# Patient Record
Sex: Female | Born: 1940 | Race: White | Hispanic: No | Marital: Married | Smoking: Never smoker
Health system: Southern US, Community
[De-identification: ages and names within clinical notes are randomized; demographics above are authoritative.]

## PROBLEM LIST (undated history)

## (undated) DIAGNOSIS — I10 Essential (primary) hypertension: Secondary | ICD-10-CM

## (undated) DIAGNOSIS — M069 Rheumatoid arthritis, unspecified: Secondary | ICD-10-CM

## (undated) DIAGNOSIS — M1990 Unspecified osteoarthritis, unspecified site: Secondary | ICD-10-CM

## (undated) DIAGNOSIS — E7800 Pure hypercholesterolemia, unspecified: Secondary | ICD-10-CM

## (undated) DIAGNOSIS — D649 Anemia, unspecified: Secondary | ICD-10-CM

## (undated) DIAGNOSIS — E119 Type 2 diabetes mellitus without complications: Secondary | ICD-10-CM

## (undated) DIAGNOSIS — I719 Aortic aneurysm of unspecified site, without rupture: Secondary | ICD-10-CM

## (undated) DIAGNOSIS — K2950 Unspecified chronic gastritis without bleeding: Secondary | ICD-10-CM

## (undated) DIAGNOSIS — M109 Gout, unspecified: Secondary | ICD-10-CM

## (undated) DIAGNOSIS — K449 Diaphragmatic hernia without obstruction or gangrene: Secondary | ICD-10-CM

## (undated) DIAGNOSIS — T4145XA Adverse effect of unspecified anesthetic, initial encounter: Secondary | ICD-10-CM

## (undated) DIAGNOSIS — M8580 Other specified disorders of bone density and structure, unspecified site: Secondary | ICD-10-CM

## (undated) DIAGNOSIS — R0600 Dyspnea, unspecified: Secondary | ICD-10-CM

## (undated) DIAGNOSIS — M545 Low back pain, unspecified: Secondary | ICD-10-CM

## (undated) DIAGNOSIS — T8859XA Other complications of anesthesia, initial encounter: Secondary | ICD-10-CM

## (undated) DIAGNOSIS — R112 Nausea with vomiting, unspecified: Secondary | ICD-10-CM

## (undated) DIAGNOSIS — Z87442 Personal history of urinary calculi: Secondary | ICD-10-CM

## (undated) DIAGNOSIS — Z9889 Other specified postprocedural states: Secondary | ICD-10-CM

## (undated) DIAGNOSIS — R0902 Hypoxemia: Secondary | ICD-10-CM

## (undated) HISTORY — DX: Rheumatoid arthritis, unspecified: M06.9

## (undated) HISTORY — DX: Pure hypercholesterolemia, unspecified: E78.00

## (undated) HISTORY — DX: Low back pain, unspecified: M54.50

## (undated) HISTORY — PX: ABDOMINAL HYSTERECTOMY: SHX81

## (undated) HISTORY — DX: Diaphragmatic hernia without obstruction or gangrene: K44.9

## (undated) HISTORY — DX: Anemia, unspecified: D64.9

## (undated) HISTORY — DX: Aortic aneurysm of unspecified site, without rupture: I71.9

## (undated) HISTORY — DX: Unspecified osteoarthritis, unspecified site: M19.90

## (undated) HISTORY — DX: Type 2 diabetes mellitus without complications: E11.9

## (undated) HISTORY — DX: Other specified disorders of bone density and structure, unspecified site: M85.80

## (undated) HISTORY — DX: Unspecified chronic gastritis without bleeding: K29.50

## (undated) HISTORY — DX: Gout, unspecified: M10.9

## (undated) HISTORY — DX: Essential (primary) hypertension: I10

---

## 2015-05-14 DIAGNOSIS — M05771 Rheumatoid arthritis with rheumatoid factor of right ankle and foot without organ or systems involvement: Secondary | ICD-10-CM | POA: Insufficient documentation

## 2015-12-09 ENCOUNTER — Encounter: Payer: Self-pay | Admitting: Physician Assistant

## 2016-01-08 ENCOUNTER — Telehealth: Payer: Self-pay | Admitting: Radiology

## 2016-01-08 ENCOUNTER — Telehealth: Payer: Self-pay | Admitting: Rheumatology

## 2016-01-08 ENCOUNTER — Other Ambulatory Visit: Payer: Self-pay | Admitting: Radiology

## 2016-01-08 MED ORDER — METHOTREXATE SODIUM CHEMO INJECTION 50 MG/2ML
INTRAMUSCULAR | 0 refills | Status: DC
Start: 2016-01-08 — End: 2016-03-04

## 2016-01-08 NOTE — Telephone Encounter (Signed)
Here is the plan from the last visit: High risk prescriptions.  She is on methotrexate injectable 0.6 mL every week that is injected by her son.  Folic acid 2 per day.  Okayed a refill of 0.6 mL's per week. Disp: 10ml with preservative  Nmeed appt in 4 months from sept visit.pls ask pt to make appt

## 2016-01-08 NOTE — Telephone Encounter (Signed)
Last visit 11/29/15 Next visit not sch message sent for scheduling Labs 11/29/15  Will you review ? When you last evaluated her in the office patient was using a lower dose of MTX than prescribed but was doing well, did you want her to continue 0.766mL, or increase to 0.118mL?

## 2016-01-08 NOTE — Telephone Encounter (Signed)
Patient new medical record, old account number [id number]

## 2016-01-08 NOTE — Telephone Encounter (Signed)
Patient needs follow up appt with Dr Deveshwar pls call to make appt. Dec  

## 2016-01-08 NOTE — Telephone Encounter (Signed)
As stated previously, message has been sent for appt scheduling  Rx was not sent in on 11/29/15 refill was not due  thank you for clarification

## 2016-01-10 NOTE — Telephone Encounter (Signed)
Tried calling patient, no answer and no voicemail available

## 2016-01-14 ENCOUNTER — Encounter: Payer: Self-pay | Admitting: Rheumatology

## 2016-01-14 ENCOUNTER — Telehealth: Payer: Self-pay | Admitting: Radiology

## 2016-01-14 NOTE — Telephone Encounter (Signed)
-----   Message from Pollyann SavoyShaili Deveshwar, MD sent at 01/14/2016 11:41 AM EST ----- May reduce Vit D suppl

## 2016-01-14 NOTE — Telephone Encounter (Signed)
Called patient to advise. She has already been advised by her PCP to reduce dose.

## 2016-01-14 NOTE — Progress Notes (Signed)
May reduce Vit D suppl

## 2016-01-27 ENCOUNTER — Telehealth: Payer: Self-pay | Admitting: Rheumatology

## 2016-01-27 ENCOUNTER — Other Ambulatory Visit: Payer: Self-pay | Admitting: *Deleted

## 2016-01-27 DIAGNOSIS — Z79899 Other long term (current) drug therapy: Secondary | ICD-10-CM

## 2016-01-27 LAB — CBC WITH DIFFERENTIAL/PLATELET
BASOS PCT: 1 %
Basophils Absolute: 101 cells/uL (ref 0–200)
Eosinophils Absolute: 505 cells/uL — ABNORMAL HIGH (ref 15–500)
Eosinophils Relative: 5 %
HCT: 35.7 % (ref 35.0–45.0)
Hemoglobin: 11.7 g/dL (ref 11.7–15.5)
LYMPHS PCT: 22 %
Lymphs Abs: 2222 cells/uL (ref 850–3900)
MCH: 30.5 pg (ref 27.0–33.0)
MCHC: 32.8 g/dL (ref 32.0–36.0)
MCV: 93 fL (ref 80.0–100.0)
MONO ABS: 606 {cells}/uL (ref 200–950)
MONOS PCT: 6 %
MPV: 12.4 fL (ref 7.5–12.5)
NEUTROS ABS: 6666 {cells}/uL (ref 1500–7800)
Neutrophils Relative %: 66 %
PLATELETS: 334 10*3/uL (ref 140–400)
RBC: 3.84 MIL/uL (ref 3.80–5.10)
RDW: 19.1 % — AB (ref 11.0–15.0)
WBC: 10.1 10*3/uL (ref 3.8–10.8)

## 2016-01-27 NOTE — Telephone Encounter (Signed)
In September we sent authorization for patient to have Euflexxa inj's. Patient wants to get these started.

## 2016-01-28 ENCOUNTER — Telehealth: Payer: Self-pay | Admitting: Radiology

## 2016-01-28 LAB — COMPLETE METABOLIC PANEL WITH GFR
ALT: 11 U/L (ref 6–29)
AST: 13 U/L (ref 10–35)
Albumin: 4.1 g/dL (ref 3.6–5.1)
Alkaline Phosphatase: 66 U/L (ref 33–130)
BUN: 23 mg/dL (ref 7–25)
CHLORIDE: 103 mmol/L (ref 98–110)
CO2: 19 mmol/L — AB (ref 20–31)
CREATININE: 0.66 mg/dL (ref 0.60–0.93)
Calcium: 9.6 mg/dL (ref 8.6–10.4)
GFR, Est African American: >89 mL/min (ref >=60)
GFR, Est Non African American: 88 mL/min (ref >=60)
GLUCOSE: 114 mg/dL — AB (ref 65–99)
Potassium: 4.4 mmol/L (ref 3.5–5.3)
Sodium: 137 mmol/L (ref 135–146)
Total Bilirubin: 0.3 mg/dL (ref 0.2–1.2)
Total Protein: 7.9 g/dL (ref 6.1–8.1)

## 2016-01-28 NOTE — Progress Notes (Signed)
Labs normal.

## 2016-01-28 NOTE — Telephone Encounter (Signed)
Too soon, this was sent on 01/08/16 for 44mo supply

## 2016-01-28 NOTE — Telephone Encounter (Signed)
Refill request received via fax for MTX    

## 2016-01-29 ENCOUNTER — Telehealth: Payer: Self-pay | Admitting: Radiology

## 2016-01-29 NOTE — Telephone Encounter (Signed)
I have called patient to advise labs are normal  

## 2016-01-29 NOTE — Telephone Encounter (Signed)
-----   Message from Pollyann SavoyShaili Deveshwar, MD sent at 01/28/2016  1:23 PM EST ----- Labs normal

## 2016-02-28 DIAGNOSIS — Z8639 Personal history of other endocrine, nutritional and metabolic disease: Secondary | ICD-10-CM | POA: Insufficient documentation

## 2016-02-28 DIAGNOSIS — Z8619 Personal history of other infectious and parasitic diseases: Secondary | ICD-10-CM | POA: Insufficient documentation

## 2016-02-28 DIAGNOSIS — Z79899 Other long term (current) drug therapy: Secondary | ICD-10-CM | POA: Insufficient documentation

## 2016-02-28 DIAGNOSIS — M170 Bilateral primary osteoarthritis of knee: Secondary | ICD-10-CM | POA: Insufficient documentation

## 2016-02-28 DIAGNOSIS — M0609 Rheumatoid arthritis without rheumatoid factor, multiple sites: Secondary | ICD-10-CM | POA: Insufficient documentation

## 2016-02-28 DIAGNOSIS — M1A09X0 Idiopathic chronic gout, multiple sites, without tophus (tophi): Secondary | ICD-10-CM | POA: Insufficient documentation

## 2016-02-28 DIAGNOSIS — M2450 Contracture, unspecified joint: Secondary | ICD-10-CM | POA: Insufficient documentation

## 2016-02-28 NOTE — Progress Notes (Signed)
Office Visit Note  Patient: Mary [name]             Date of Birth: August 17, 1940           MRN: [id number]             PCP: Imagene Riches, NP Referring: No ref. provider found Visit Date: 03/04/2016 Occupation: _0 @    Subjective:  Pain of the Right Knee and Pain of the Left Knee Follow-up on rheumatoid arthritis, high risk prescription, gout, knee pain, osteoporosis  History of Present Illness: Mary [name] is a 75 y.o. female  Last seen in our office on 11/29/2015 and was requested to return back to our office today. Patient is doing well with her rheumatoid arthritis. She has some old damage from her severe disease but has been doing well with methotrexate. She gets methotrexate injectable 0.6 ML's every week. It is injected by her son. She takes folic acid 2 mg daily.  She also has gout for which he takes allopurinol 100 mg twice a day.  She has ongoing knee joint pain. X-rays were done July 2017 which proved that she had osteoarthritis (severe medial compartment narrowing. She has failed and states, failed weight loss, can't take cortisone injection due to diabetes mellitus. As a result we applied for Euflexxa 3 bilateral knee joint. Patient states that she has not heard about the Euflexxa so we submitted another request for prior off for this for the patient's knees. She does have trouble walking and I suspect that these injections might prove to be helpful for the patient.  Patient's CBC with differential and CMP with GFR were normal that were done on 01/28/2016. She request future labs to be done in her local lab.     Activities of Daily Living:  Patient reports morning stiffness for 30 minutes.   Patient Reports nocturnal pain.  Difficulty dressing/grooming: Reports Difficulty climbing stairs: Reports Difficulty getting out of chair: Reports Difficulty using hands for taps, buttons, cutlery, and/or writing: Reports   Review of Systems  Constitutional:  Negative for fatigue.  HENT: Negative for mouth sores and mouth dryness.   Eyes: Negative for dryness.  Respiratory: Negative for shortness of breath.   Gastrointestinal: Negative for constipation and diarrhea.  Musculoskeletal: Negative for myalgias and myalgias.  Skin: Negative for sensitivity to sunlight.  Psychiatric/Behavioral: Negative for decreased concentration and sleep disturbance.    PMFS History:  Patient Active Problem List   Diagnosis Date Noted  . Rheumatoid arthritis of multiple sites with negative rheumatoid factor ([place]) 02/28/2016  . High risk medication use 02/28/2016  . Idiopathic chronic gout of multiple sites without tophus 02/28/2016  . Primary osteoarthritis of both knees 02/28/2016  . Flexion contractures 02/28/2016  . History of hypothyroidism 02/28/2016  . History of diabetes mellitus 02/28/2016  . History of shingles 02/28/2016  . History of hyperlipidemia 02/28/2016    No past medical history on file.  No family history on file. No past surgical history on file. Social History   Social History Narrative  . No narrative on file     Objective: Vital Signs: BP 118/72   Pulse 68   Resp 14   Ht _1  (1.6 m)   Wt 151 lb (68.5 kg)   BMI 26.75 kg/m    Physical Exam  Constitutional: She is oriented to person, place, and time. She appears well-developed and well-nourished.  HENT:  Head: Normocephalic and atraumatic.  Eyes: EOM are normal. Pupils are equal, round, and reactive  to light.  Cardiovascular: Normal rate, regular rhythm and normal heart sounds.  Exam reveals no gallop and no friction rub.   No murmur heard. Pulmonary/Chest: Effort normal and breath sounds normal. She has no wheezes. She has no rales.  Abdominal: Soft. Bowel sounds are normal. She exhibits no distension. There is no tenderness. There is no guarding. No hernia.  Musculoskeletal: Normal range of motion. She exhibits no edema, tenderness or deformity.  Lymphadenopathy:     She has no cervical adenopathy.  Neurological: She is alert and oriented to person, place, and time. Coordination normal.  Skin: Skin is warm and dry. Capillary refill takes less than 2 seconds. No rash noted.  Psychiatric: She has a normal mood and affect. Her behavior is normal.     Musculoskeletal Exam:  Decreased range of motion of bilateral shoulder joint bilateral elbows and bilateral hands. Decreased grip strength bilaterally Fiber myalgia tender points are all absent  CDAI Exam: CDAI Homunculus Exam:   Swelling:  Right hand: 1st MCP, 2nd MCP, 3rd MCP, 4th MCP, 5th MCP, 2nd PIP, 3rd PIP, 4th PIP and 5th PIP Left hand: 1st MCP, 2nd MCP, 3rd MCP, 4th MCP, 5th MCP, 2nd PIP, 3rd PIP, 4th PIP and 5th PIP RLE: tibiotalar LLE: tibiotalar  Joint Counts:  CDAI Tender Joint count: 0 CDAI Swollen Joint count: 18  Global Assessments:  Patient Global Assessment: 5 Provider Global Assessment: 5  CDAI Calculated Score: 28  There is synovial thickening of bilateral wrist bilateral MCP joints bilateral PIP joints and bilateral ankles. This is consistent with her severe disease. She does not have any active synovitis. She is doing really well with her methotrexate injectable.  Investigation: Findings:  RA.  Seronegative.  Severe disease.    July 2017:  Her CBC was normal.  Comprehensive metabolic panel was normal.  UA showed some hyaline casts; 14-3-3 eta, CCP, and rheumatoid factor were negative.  Uric acid was 5.3.  SPEP was normal.  Immunoglobulins showed elevated IgG which could be seen with inflammatory conditions.  Hepatitis panel, HIV, and TB test were all negative.  Her Rapid-3 score was 4.3 which was consistent with high severity.  09/09/2015 X-rays of bilateral hands, 2 views today, showed all intercarpal joints and her right wrist joint.  Left wrist joint showed severe intercarpal joint space narrowing. Radiocarpal joint space narrowing. Ulnar styloids were tapered bilaterally.   All MCPs were subluxed with erosive changes.  PIP and DIP narrowing and severe MCP narrowing.  Bilateral feet x-rays showed severe erosive changes in all MTP joints with subluxation of all MTP joints.  Severe narrowing of tibiotalar and subtalar joints and intertarsal joints.  Bilateral knee joint x-rays showed severe medial and lateral compartment narrowing without erosive changes.  Bilateral elbow joints also showed severe joint space narrowing.    Imaging: No results found.  Speciality Comments: No specialty comments available.    Procedures:  No procedures performed Allergies: Aspirin and Codeine   Assessment / Plan:     Visit Diagnoses: Rheumatoid arthritis of multiple sites with negative rheumatoid factor (HCC)  High risk medication use - Methotrexate 0.72m weekly (injection by son);  - Plan: CMP14+EGFR, Uric acid, CBC with Differential/Platelet, CANCELED: Uric acid, CANCELED: CBC with Differential/Platelet, CANCELED: Uric acid, CANCELED: CBC with Differential/Platelet  Idiopathic chronic gout of multiple sites without tophus - Plan: Uric acid, CANCELED: Uric acid, CANCELED: Uric acid  Primary osteoarthritis of both knees  Contractures multiple joints   History of hypothyroidism  History of diabetes  mellitus  History of shingles - 04/2015  History of hyperlipidemia  Age-related osteoporosis without current pathological fracture - 12/09/2015:==> T = -3.6 left femoral neck; Started Fosamax 421m weekly Jan 2018;    Plan: #1: Rheumatoid arthritis, severe disease, seronegative. No synovitis on exam synovial thickening as described above. Patient is doing well with the methotrexate injectable. She is happy with 0.6 mL's per week. She does have some fatigue the day after she gets her injection.  #2: Gout. No flare. On allopurinol 100 mg twice a day  #3: OA of knee joint. Severe medial compartment narrowing. X-ray done about July 2017. We applied for Euflex bilateral knees 3  approximately September 2017 visit but patient has not heard anything back and so we'll get another prior off that started for the patient. He would be nice to start her injections as soon as possible.  #4: Osteoporosis. T score is -3.6. We discussed osteoporosis in depth with the patient and started her on Fosamax weekly. Patient is agreeable. We will do a 30 day supply with 2 refills. If she does not have any kind of problems with the first month of Fosamax, we will do a 90 day supply and make it easier for the patient.  #5: CBC with differential CMP with GFR uric acid due February 2018 at her local lab.  #6: Return to clinic in 4 months  Orders: Orders Placed This Encounter  Procedures  . CMP14+EGFR  . Uric acid  . CBC with Differential/Platelet   Meds ordered this encounter  Medications  . methotrexate 50 MG/2ML injection    Sig: Inject 0.667msub q weekly    Dispense:  10 mL    Refill:  0    Provide 2 mL bottles with preservatives. Dispense enough for 90 day supply. Patient is taking 0.6 mL's per week.    Order Specific Question:   Supervising Provider    Answer:   DELyda Perone. Tuberculin-Allergy Syringes 27G X 1/2" 1 ML KIT    Sig: Inject 1 Syringe into the skin once a week. If syringe size greater than 21m77mplease call me at my office.    Dispense:  12 each    Refill:  4    Order Specific Question:   Supervising Provider    Answer:   DEVLyda Perone folic acid (FOLVITE) 1 MG tablet    Sig: Take 2 tablets (2 mg total) by mouth daily.    Dispense:  180 tablet    Refill:  4    Order Specific Question:   Supervising Provider    Answer:   DEVBo [id number]]  . alendronate (FOSAMAX) 70 MG tablet    Sig: Take 1 tablet (70 mg total) by mouth once a week. Take with a full glass of water on an empty stomach.    Dispense:  4 tablet    Refill:  2    Order Specific Question:   Supervising Provider    Answer:   DEVBo [phone]  Face-to-face time spent with patient was 50 minutes. 50% of time was spent in counseling and coordination of care.  Follow-Up Instructions: Return in about 4 months (around 07/02/2016) for ra,mtx 0.6ml107mout, allopurinol 200mg25m knee pain, .   NaitiEliezer LoftsC

## 2016-03-04 ENCOUNTER — Encounter: Payer: Self-pay | Admitting: Rheumatology

## 2016-03-04 ENCOUNTER — Other Ambulatory Visit: Payer: Self-pay | Admitting: *Deleted

## 2016-03-04 ENCOUNTER — Ambulatory Visit (INDEPENDENT_AMBULATORY_CARE_PROVIDER_SITE_OTHER): Payer: Medicare HMO | Admitting: Rheumatology

## 2016-03-04 VITALS — BP 118/72 | HR 68 | Resp 14 | Ht 63.0 in | Wt 151.0 lb

## 2016-03-04 DIAGNOSIS — Z79899 Other long term (current) drug therapy: Secondary | ICD-10-CM

## 2016-03-04 DIAGNOSIS — M810 Age-related osteoporosis without current pathological fracture: Secondary | ICD-10-CM

## 2016-03-04 DIAGNOSIS — Z8619 Personal history of other infectious and parasitic diseases: Secondary | ICD-10-CM | POA: Diagnosis not present

## 2016-03-04 DIAGNOSIS — M0609 Rheumatoid arthritis without rheumatoid factor, multiple sites: Secondary | ICD-10-CM

## 2016-03-04 DIAGNOSIS — M2450 Contracture, unspecified joint: Secondary | ICD-10-CM | POA: Diagnosis not present

## 2016-03-04 DIAGNOSIS — M1A09X0 Idiopathic chronic gout, multiple sites, without tophus (tophi): Secondary | ICD-10-CM

## 2016-03-04 DIAGNOSIS — Z8639 Personal history of other endocrine, nutritional and metabolic disease: Secondary | ICD-10-CM

## 2016-03-04 DIAGNOSIS — M170 Bilateral primary osteoarthritis of knee: Secondary | ICD-10-CM

## 2016-03-04 MED ORDER — FOLIC ACID 1 MG PO TABS: 2.0000 mg | ORAL_TABLET | Freq: Every day | ORAL | 4 refills | Status: AC

## 2016-03-04 MED ORDER — ALENDRONATE SODIUM 70 MG PO TABS: 70.0000 mg | ORAL_TABLET | ORAL | 2 refills | Status: AC

## 2016-03-04 MED ORDER — METHOTREXATE SODIUM CHEMO INJECTION 50 MG/2ML: INTRAMUSCULAR | 0 refills | Status: DC

## 2016-03-04 MED ORDER — "TUBERCULIN-ALLERGY SYRINGES 27G X 1/2"" 1 ML KIT": 1.0000 | PACK | 4 refills | Status: DC

## 2016-04-29 LAB — CBC WITH DIFFERENTIAL/PLATELET
BASOS: 1 %
Basophils Absolute: 0.1 10*3/uL (ref 0.0–0.2)
EOS (ABSOLUTE): 0.2 10*3/uL (ref 0.0–0.4)
EOS: 3 %
HEMATOCRIT: 33.8 % — AB (ref 34.0–46.6)
Hemoglobin: 11.3 g/dL (ref 11.1–15.9)
Immature Grans (Abs): 0 10*3/uL (ref 0.0–0.1)
Immature Granulocytes: 0 %
LYMPHS ABS: 1.9 10*3/uL (ref 0.7–3.1)
Lymphs: 22 %
MCH: 31.9 pg (ref 26.6–33.0)
MCHC: 33.4 g/dL (ref 31.5–35.7)
MCV: 96 fL (ref 79–97)
MONOS ABS: 0.6 10*3/uL (ref 0.1–0.9)
Monocytes: 7 %
NEUTROS ABS: 5.8 10*3/uL (ref 1.4–7.0)
Neutrophils: 67 %
PLATELETS: 295 10*3/uL (ref 150–379)
RBC: 3.54 x10E6/uL — ABNORMAL LOW (ref 3.77–5.28)
RDW: 17.5 % — AB (ref 12.3–15.4)
WBC: 8.6 10*3/uL (ref 3.4–10.8)

## 2016-04-29 LAB — CMP14+EGFR
ALBUMIN: 4.4 g/dL (ref 3.5–4.8)
ALT: 11 IU/L (ref 0–32)
AST: 11 IU/L (ref 0–40)
Albumin/Globulin Ratio: 1.3 (ref 1.2–2.2)
Alkaline Phosphatase: 68 IU/L (ref 39–117)
BILIRUBIN TOTAL: 0.3 mg/dL (ref 0.0–1.2)
BUN / CREAT RATIO: 25 (ref 12–28)
BUN: 16 mg/dL (ref 8–27)
CHLORIDE: 98 mmol/L (ref 96–106)
CO2: 19 mmol/L (ref 18–29)
Calcium: 9.6 mg/dL (ref 8.7–10.3)
Creatinine, Ser: 0.63 mg/dL (ref 0.57–1.00)
GFR calc non Af Amer: 89 mL/min/{1.73_m2} (ref >59)
GFR, EST AFRICAN AMERICAN: 102 mL/min/{1.73_m2} (ref >59)
Globulin, Total: 3.4 g/dL (ref 1.5–4.5)
Glucose: 121 mg/dL — ABNORMAL HIGH (ref 65–99)
Potassium: 5.1 mmol/L (ref 3.5–5.2)
SODIUM: 136 mmol/L (ref 134–144)
TOTAL PROTEIN: 7.8 g/dL (ref 6.0–8.5)

## 2016-04-29 LAB — URIC ACID: Uric Acid: 5 mg/dL (ref 2.5–7.1)

## 2016-05-13 ENCOUNTER — Telehealth: Payer: Self-pay | Admitting: Rheumatology

## 2016-05-13 NOTE — Telephone Encounter (Signed)
They received a prescription Euflexxa, but the prior authorization was done for the Orthovisc.  She is assuming that is the preferred medication.  They are needing a prescription for the Orthovisc.  ZO#[phone]#[phone].  Fax# [phone].  Thank you.

## 2016-06-22 ENCOUNTER — Telehealth (INDEPENDENT_AMBULATORY_CARE_PROVIDER_SITE_OTHER): Payer: Self-pay | Admitting: Rheumatology

## 2016-06-22 MED ORDER — ALENDRONATE SODIUM 70 MG PO TABS: 70.0000 mg | ORAL_TABLET | ORAL | 1 refills | Status: DC

## 2016-06-22 NOTE — Telephone Encounter (Signed)
Patient request Rx for Alendronate . Patient uses [facility]. # [phone] Please call when Rx called in.

## 2016-06-22 NOTE — Telephone Encounter (Signed)
03/04/16 last visit  07/02/16 next visit  Labs 04/28/16 Ok to refill per Dr Corliss Skains

## 2016-06-28 NOTE — Progress Notes (Signed)
Office Visit Note  Patient: Mary [name]             Date of Birth: 12-14-1940           MRN: [id number]             PCP: Imagene Riches, NP Referring: Imagene Riches, NP Visit Date: 07/02/2016 Occupation: _0 @    Subjective:  Pain knees   History of Present Illness: Mary [name] is a 76 y.o. female with history of rheumatoid arthritis. She states she is doing fairly well on methotrexate. She continues to have some discomfort in her bilateral knee joints. She denies any joint swelling.  Activities of Daily Living:  Patient reports morning stiffness for 0 minute.   Patient Denies nocturnal pain.  Difficulty dressing/grooming: Denies Difficulty climbing stairs: Reports Difficulty getting out of chair: Reports Difficulty using hands for taps, buttons, cutlery, and/or writing: Reports   Review of Systems  Constitutional: Positive for fatigue. Negative for night sweats, weight gain, weight loss and weakness.  HENT: Negative for mouth sores, trouble swallowing, trouble swallowing, mouth dryness and nose dryness.   Eyes: Negative for pain, redness, visual disturbance and dryness.  Respiratory: Negative for cough, shortness of breath and difficulty breathing.   Cardiovascular: Negative for chest pain, palpitations, hypertension, irregular heartbeat and swelling in legs/feet.  Gastrointestinal: Negative for blood in stool, constipation and diarrhea.  Endocrine: Negative for increased urination.  Genitourinary: Negative for vaginal dryness.  Musculoskeletal: Negative for arthralgias, joint pain, joint swelling, myalgias, muscle weakness, morning stiffness, muscle tenderness and myalgias.  Skin: Negative for color change, rash, hair loss, skin tightness, ulcers and sensitivity to sunlight.  Allergic/Immunologic: Negative for susceptible to infections.  Neurological: Negative for dizziness, memory loss and night sweats.  Hematological: Negative for swollen glands.    Psychiatric/Behavioral: Negative for depressed mood and sleep disturbance. The patient is not nervous/anxious.     PMFS History:  Patient Active Problem List   Diagnosis Date Noted  . Rheumatoid arthritis of multiple sites with negative rheumatoid factor ([place]) 02/28/2016  . High risk medication use 02/28/2016  . Idiopathic chronic gout of multiple sites without tophus 02/28/2016  . Primary osteoarthritis of both knees 02/28/2016  . Flexion contractures 02/28/2016  . History of hypothyroidism 02/28/2016  . History of diabetes mellitus 02/28/2016  . History of shingles 02/28/2016  . History of hyperlipidemia 02/28/2016    Past Medical History:  Diagnosis Date  . Diabetes mellitus without complication ([place])   . Gout   . High cholesterol   . Hypertension   . Osteoarthritis   . Rheumatoid arthritis ([place])     History reviewed. No pertinent family history. Past Surgical History:  Procedure Laterality Date  . ABDOMINAL HYSTERECTOMY     Social History   Social History Narrative  . No narrative on file     Objective: Vital Signs: BP (!) 154/76 (BP Location: Left Arm, Patient Position: Sitting)   Pulse 78   Resp 14   Wt 156 lb (70.8 kg)   BMI 27.63 kg/m    Physical Exam  Constitutional: She is oriented to person, place, and time. She appears well-developed and well-nourished.  HENT:  Head: Normocephalic and atraumatic.  Eyes: Conjunctivae and EOM are normal.  Neck: Normal range of motion.  Cardiovascular: Normal rate, regular rhythm, normal heart sounds and intact distal pulses.   Pulmonary/Chest: Effort normal and breath sounds normal.  Abdominal: Soft. Bowel sounds are normal.  Lymphadenopathy:    She has no cervical adenopathy.  Neurological: She is alert and oriented to person, place, and time.  Skin: Skin is warm and dry. Capillary refill takes less than 2 seconds.  Psychiatric: She has a normal mood and affect. Her behavior is normal.  Nursing note and vitals  reviewed.    Musculoskeletal Exam: C-spine limited range of motion. Shoulder joint abduction limited to 90. She is contracture in her bilateral elbows. She has very limited range of motion of her wrist joints. She is swelling in her right hand which appears to be dependent edema. She has some subluxation of most MCP joints. Contractures in her PIP joints. Hip joints could not be examined on the exam chair. She has a limited extension of bilateral knee joints due to severe osteoarthritis. Some warmth was noted over bilateral knee joints. CDAI Exam: CDAI Homunculus Exam:   Tenderness:  RLE: tibiofemoral LLE: tibiofemoral  Joint Counts:  CDAI Tender Joint count: 2 CDAI Swollen Joint count: 0  Global Assessments:  Patient Global Assessment: 3 Provider Global Assessment: 3  CDAI Calculated Score: 8    Investigation: No additional findings. Office Visit on 03/04/2016  Component Date Value Ref Range Status  . Glucose 04/28/2016 121* 65 - 99 mg/dL Final  . BUN 04/28/2016 16  8 - 27 mg/dL Final  . Creatinine, Ser 04/28/2016 0.63  0.57 - 1.00 mg/dL Final  . GFR calc non Af Amer 04/28/2016 89  >59 mL/min/1.73 Final  . GFR calc Af Amer 04/28/2016 102  >59 mL/min/1.73 Final  . BUN/Creatinine Ratio 04/28/2016 25  12 - 28 Final  . Sodium 04/28/2016 136  134 - 144 mmol/L Final  . Potassium 04/28/2016 5.1  3.5 - 5.2 mmol/L Final  . Chloride 04/28/2016 98  96 - 106 mmol/L Final  . CO2 04/28/2016 19  18 - 29 mmol/L Final  . Calcium 04/28/2016 9.6  8.7 - 10.3 mg/dL Final  . Total Protein 04/28/2016 7.8  6.0 - 8.5 g/dL Final  . Albumin 04/28/2016 4.4  3.5 - 4.8 g/dL Final  . Globulin, Total 04/28/2016 3.4  1.5 - 4.5 g/dL Final  . Albumin/Globulin Ratio 04/28/2016 1.3  1.2 - 2.2 Final  . Bilirubin Total 04/28/2016 0.3  0.0 - 1.2 mg/dL Final  . Alkaline Phosphatase 04/28/2016 68  39 - 117 IU/L Final  . AST 04/28/2016 11  0 - 40 IU/L Final  . ALT 04/28/2016 11  0 - 32 IU/L Final  . Uric Acid  04/28/2016 5.0  2.5 - 7.1 mg/dL Final  . WBC 04/28/2016 8.6  3.4 - 10.8 x10E3/uL Final  . RBC 04/28/2016 3.54* 3.77 - 5.28 x10E6/uL Final  . Hemoglobin 04/28/2016 11.3  11.1 - 15.9 g/dL Final  . Hematocrit 04/28/2016 33.8* 34.0 - 46.6 % Final  . MCV 04/28/2016 96  79 - 97 fL Final  . MCH 04/28/2016 31.9  26.6 - 33.0 pg Final  . MCHC 04/28/2016 33.4  31.5 - 35.7 g/dL Final  . RDW 04/28/2016 17.5* 12.3 - 15.4 % Final  . Platelets 04/28/2016 295  150 - 379 x10E3/uL Final  . Neutrophils 04/28/2016 67  Not Estab. % Final  . Lymphs 04/28/2016 22  Not Estab. % Final  . Monocytes 04/28/2016 7  Not Estab. % Final  . Eos 04/28/2016 3  Not Estab. % Final  . Basos 04/28/2016 1  Not Estab. % Final  . Neutrophils Absolute 04/28/2016 5.8  1.4 - 7.0 x10E3/uL Final  . Lymphocytes Absolute 04/28/2016 1.9  0.7 - 3.1 x10E3/uL Final  . Monocytes  Absolute 04/28/2016 0.6  0.1 - 0.9 x10E3/uL Final  . EOS (ABSOLUTE) 04/28/2016 0.2  0.0 - 0.4 x10E3/uL Final  . Basophils Absolute 04/28/2016 0.1  0.0 - 0.2 x10E3/uL Final  . Immature Granulocytes 04/28/2016 0  Not Estab. % Final  . Immature Grans (Abs) 04/28/2016 0.0  0.0 - 0.1 x10E3/uL Final  Lab on 01/27/2016  Component Date Value Ref Range Status  . WBC 01/27/2016 10.1  3.8 - 10.8 K/uL Final  . RBC 01/27/2016 3.84  3.80 - 5.10 MIL/uL Final  . Hemoglobin 01/27/2016 11.7  11.7 - 15.5 g/dL Final  . HCT 01/27/2016 35.7  35.0 - 45.0 % Final  . MCV 01/27/2016 93.0  80.0 - 100.0 fL Final  . MCH 01/27/2016 30.5  27.0 - 33.0 pg Final  . MCHC 01/27/2016 32.8  32.0 - 36.0 g/dL Final  . RDW 01/27/2016 19.1* 11.0 - 15.0 % Final  . Platelets 01/27/2016 334  140 - 400 K/uL Final  . MPV 01/27/2016 12.4  7.5 - 12.5 fL Final  . Neutro Abs 01/27/2016 6666  1,500 - 7,800 cells/uL Final  . Lymphs Abs 01/27/2016 2222  850 - 3,900 cells/uL Final  . Monocytes Absolute 01/27/2016 606  200 - 950 cells/uL Final  . Eosinophils Absolute 01/27/2016 505* 15 - 500 cells/uL Final  .  Basophils Absolute 01/27/2016 101  0 - 200 cells/uL Final  . Neutrophils Relative % 01/27/2016 66  % Final  . Lymphocytes Relative 01/27/2016 22  % Final  . Monocytes Relative 01/27/2016 6  % Final  . Eosinophils Relative 01/27/2016 5  % Final  . Basophils Relative 01/27/2016 1  % Final  . Smear Review 01/27/2016 Criteria for review not met   Final  . Sodium 01/27/2016 137  135 - 146 mmol/L Final  . Potassium 01/27/2016 4.4  3.5 - 5.3 mmol/L Final  . Chloride 01/27/2016 103  98 - 110 mmol/L Final  . CO2 01/27/2016 19* 20 - 31 mmol/L Final  . Glucose, Bld 01/27/2016 114* 65 - 99 mg/dL Final  . BUN 01/27/2016 23  7 - 25 mg/dL Final  . Creat 01/27/2016 0.66  0.60 - 0.93 mg/dL Final   Comment:   For patients > or = 74 years of age: The upper reference limit for Creatinine is approximately 13% higher for people identified as African-American.     . Total Bilirubin 01/27/2016 0.3  0.2 - 1.2 mg/dL Final  . Alkaline Phosphatase 01/27/2016 66  33 - 130 U/L Final  . AST 01/27/2016 13  10 - 35 U/L Final  . ALT 01/27/2016 11  6 - 29 U/L Final  . Total Protein 01/27/2016 7.9  6.1 - 8.1 g/dL Final  . Albumin 01/27/2016 4.1  3.6 - 5.1 g/dL Final  . Calcium 01/27/2016 9.6  8.6 - 10.4 mg/dL Final  . GFR, Est African American 01/27/2016 >89  >=60 mL/min Final  . GFR, Est Non African American 01/27/2016 88  >=60 mL/min Final     Imaging: No results found.  Speciality Comments: No specialty comments available.  Apr 28 2016 CBC normal, CMP glucose 121, uric acid 5.0  Procedures:  No procedures performed Allergies: Aspirin and Codeine   Assessment / Plan:     Visit Diagnoses: Rheumatoid arthritis of multiple sites with negative rheumatoid factor ([place]) - Severe disease with multiple contractures. She states she's having no increase discomfort since the last visit. She feels she had good response to methotrexate only joints which are painful now is her  knee joints.  High risk medication use -  Methotrexate 0.6 mL subcutaneous every week, folic acid 2 mg by mouth daily. Her labs have been stable.  Idiopathic chronic gout of multiple sites without tophus - On allopurinol 200 mg by mouth daily at she denies any gout flare.  Primary osteoarthritis of both knees - Bilateral severe. She is end-stage arthritis. She is interested in getting Visco supplement injections.  Flexion contractures in multiple joints  Age-related osteoporosis without current pathological fracture - 12/09/2015 T score -3.6 left femoral neck, on Fosamax 70 mg by mouth every week (January 2018)  History of hypothyroidism  History of diabetes mellitus  History of hyperlipidemia    Orders: Orders Placed This Encounter  Procedures  . CBC with Differential/Platelet  . CMP14+EGFR   No orders of the defined types were placed in this encounter.   Face-to-face time spent with patient was 30 minutes. 50% of time was spent in counseling and coordination of care.  Follow-Up Instructions: Return in about 5 months (around 12/02/2016) for Rheumatoid arthritis.   Bo Merino, MD  Note - This record has been created using Editor, commissioning.  Chart creation errors have been sought, but may not always  have been located. Such creation errors do not reflect on  the standard of medical care.

## 2016-07-02 ENCOUNTER — Ambulatory Visit (INDEPENDENT_AMBULATORY_CARE_PROVIDER_SITE_OTHER): Payer: Medicare HMO | Admitting: Rheumatology

## 2016-07-02 ENCOUNTER — Encounter: Payer: Self-pay | Admitting: Rheumatology

## 2016-07-02 ENCOUNTER — Encounter (INDEPENDENT_AMBULATORY_CARE_PROVIDER_SITE_OTHER): Payer: Self-pay

## 2016-07-02 VITALS — BP 154/76 | HR 78 | Resp 14 | Wt 156.0 lb

## 2016-07-02 DIAGNOSIS — M810 Age-related osteoporosis without current pathological fracture: Secondary | ICD-10-CM | POA: Diagnosis not present

## 2016-07-02 DIAGNOSIS — M2450 Contracture, unspecified joint: Secondary | ICD-10-CM

## 2016-07-02 DIAGNOSIS — M0609 Rheumatoid arthritis without rheumatoid factor, multiple sites: Secondary | ICD-10-CM | POA: Diagnosis not present

## 2016-07-02 DIAGNOSIS — M170 Bilateral primary osteoarthritis of knee: Secondary | ICD-10-CM | POA: Diagnosis not present

## 2016-07-02 DIAGNOSIS — M1A09X0 Idiopathic chronic gout, multiple sites, without tophus (tophi): Secondary | ICD-10-CM

## 2016-07-02 DIAGNOSIS — Z79899 Other long term (current) drug therapy: Secondary | ICD-10-CM | POA: Diagnosis not present

## 2016-07-02 DIAGNOSIS — Z8639 Personal history of other endocrine, nutritional and metabolic disease: Secondary | ICD-10-CM | POA: Diagnosis not present

## 2016-07-02 NOTE — Patient Instructions (Addendum)
Standing Labs We placed an order today for your standing lab work.    Please come back and get your standing labs in May and every 3 months  We have open lab Monday through Friday from 8:30-11:30 AM and 1:30-4 PM at the office of Dr. Philippa Vessey/Naitik Panwala, PA.   The office is located at [address], [address], [place], [place] [address] No appointment is necessary.   Labs are drawn by [facility].  You may receive a bill from [facility] for your lab work.    

## 2016-07-08 NOTE — Telephone Encounter (Signed)
IN FOLDER 

## 2016-07-15 NOTE — Telephone Encounter (Signed)
I spoke with this patient about Orthovisc Injections.  Her copay through [facility] would be $552. She cannot afford this.  She reached out to [employer], and has patient assistance forms she will fill out and bring to you.  There is info you need to fill out for Mary [name], and then you can fax it in for her.  Mary [name]^^^Let me know when these forms come in, I can help you.  We have submitted several of them.  [facility], no further action needed until pt gets the forms to you.  Thanks-

## 2016-07-29 LAB — CMP14+EGFR
ALT: 13 IU/L (ref 0–32)
AST: 13 IU/L (ref 0–40)
Albumin/Globulin Ratio: 1.2 (ref 1.2–2.2)
Albumin: 4.3 g/dL (ref 3.5–4.8)
Alkaline Phosphatase: 64 IU/L (ref 39–117)
BUN/Creatinine Ratio: 23 (ref 12–28)
BUN: 15 mg/dL (ref 8–27)
Bilirubin Total: 0.2 mg/dL (ref 0.0–1.2)
CALCIUM: 9.3 mg/dL (ref 8.7–10.3)
CHLORIDE: 104 mmol/L (ref 96–106)
CO2: 18 mmol/L (ref 18–29)
Creatinine, Ser: 0.65 mg/dL (ref 0.57–1.00)
GFR calc Af Amer: 101 mL/min/{1.73_m2} (ref >59)
GFR, EST NON AFRICAN AMERICAN: 88 mL/min/{1.73_m2} (ref >59)
GLUCOSE: 178 mg/dL — AB (ref 65–99)
Globulin, Total: 3.5 g/dL (ref 1.5–4.5)
Potassium: 4.8 mmol/L (ref 3.5–5.2)
Sodium: 139 mmol/L (ref 134–144)
Total Protein: 7.8 g/dL (ref 6.0–8.5)

## 2016-07-29 LAB — CBC WITH DIFFERENTIAL/PLATELET
BASOS ABS: 0.1 10*3/uL (ref 0.0–0.2)
Basos: 1 %
EOS (ABSOLUTE): 0.3 10*3/uL (ref 0.0–0.4)
Eos: 4 %
Hematocrit: 35.1 % (ref 34.0–46.6)
Hemoglobin: 11.4 g/dL (ref 11.1–15.9)
IMMATURE GRANULOCYTES: 0 %
Immature Grans (Abs): 0 10*3/uL (ref 0.0–0.1)
LYMPHS: 22 %
Lymphocytes Absolute: 1.4 10*3/uL (ref 0.7–3.1)
MCH: 30.6 pg (ref 26.6–33.0)
MCHC: 32.5 g/dL (ref 31.5–35.7)
MCV: 94 fL (ref 79–97)
MONOCYTES: 8 %
MONOS ABS: 0.6 10*3/uL (ref 0.1–0.9)
Neutrophils Absolute: 4.3 10*3/uL (ref 1.4–7.0)
Neutrophils: 65 %
PLATELETS: 342 10*3/uL (ref 150–379)
RBC: 3.72 x10E6/uL — AB (ref 3.77–5.28)
RDW: 17.8 % — AB (ref 12.3–15.4)
WBC: 6.6 10*3/uL (ref 3.4–10.8)

## 2016-07-29 NOTE — Progress Notes (Signed)
Labs are stable glucose elevated please notify patient

## 2016-08-06 ENCOUNTER — Telehealth (INDEPENDENT_AMBULATORY_CARE_PROVIDER_SITE_OTHER): Payer: Self-pay

## 2016-08-06 NOTE — Telephone Encounter (Signed)
Talked with [employer] [facility] to cancel Rx for Orthovisc.  Patient is not able to afford.  Thank You.

## 2016-08-24 ENCOUNTER — Ambulatory Visit (INDEPENDENT_AMBULATORY_CARE_PROVIDER_SITE_OTHER): Payer: Medicare HMO | Admitting: Rheumatology

## 2016-08-24 ENCOUNTER — Encounter: Payer: Self-pay | Admitting: Rheumatology

## 2016-08-24 VITALS — BP 118/84 | HR 92 | Temp 99.9°F | Resp 20

## 2016-08-24 DIAGNOSIS — M1A09X0 Idiopathic chronic gout, multiple sites, without tophus (tophi): Secondary | ICD-10-CM | POA: Diagnosis not present

## 2016-08-24 DIAGNOSIS — R300 Dysuria: Secondary | ICD-10-CM

## 2016-08-24 DIAGNOSIS — M0609 Rheumatoid arthritis without rheumatoid factor, multiple sites: Secondary | ICD-10-CM | POA: Diagnosis not present

## 2016-08-24 DIAGNOSIS — M2450 Contracture, unspecified joint: Secondary | ICD-10-CM | POA: Diagnosis not present

## 2016-08-24 DIAGNOSIS — Z8639 Personal history of other endocrine, nutritional and metabolic disease: Secondary | ICD-10-CM

## 2016-08-24 DIAGNOSIS — Z8619 Personal history of other infectious and parasitic diseases: Secondary | ICD-10-CM | POA: Diagnosis not present

## 2016-08-24 DIAGNOSIS — M170 Bilateral primary osteoarthritis of knee: Secondary | ICD-10-CM

## 2016-08-24 DIAGNOSIS — Z79899 Other long term (current) drug therapy: Secondary | ICD-10-CM

## 2016-08-24 LAB — CBC WITH DIFFERENTIAL/PLATELET
Basophils Absolute: 67 cells/uL (ref 0–200)
Basophils Relative: 1 %
EOS PCT: 2 %
Eosinophils Absolute: 134 cells/uL (ref 15–500)
HCT: 37.5 % (ref 35.0–45.0)
HEMOGLOBIN: 12.1 g/dL (ref 11.7–15.5)
LYMPHS ABS: 1072 {cells}/uL (ref 850–3900)
Lymphocytes Relative: 16 %
MCH: 29.9 pg (ref 27.0–33.0)
MCHC: 32.3 g/dL (ref 32.0–36.0)
MCV: 92.6 fL (ref 80.0–100.0)
MPV: 10.7 fL (ref 7.5–12.5)
Monocytes Absolute: 670 cells/uL (ref 200–950)
Monocytes Relative: 10 %
NEUTROS PCT: 71 %
Neutro Abs: 4757 cells/uL (ref 1500–7800)
Platelets: 412 10*3/uL — ABNORMAL HIGH (ref 140–400)
RBC: 4.05 MIL/uL (ref 3.80–5.10)
RDW: 17.9 % — ABNORMAL HIGH (ref 11.0–15.0)
WBC: 6.7 10*3/uL (ref 3.8–10.8)

## 2016-08-24 NOTE — Progress Notes (Signed)
Pharmacy Note  Subjective: Patient presents today to the [facility] to see Dr. Corliss Skainseveshwar.  Decision was made to switch patient from methotrexate to leflunomide.  Patient seen by the pharmacist for counseling on leflunomide Ranae Plumber(Arava).    Objective: CBC    Component Value Date/Time   WBC 6.6 07/28/2016 1040   WBC 10.1 01/27/2016 1252   RBC 3.72 (L) 07/28/2016 1040   RBC 3.84 01/27/2016 1252   HGB 11.4 07/28/2016 1040   HCT 35.1 07/28/2016 1040   PLT 342 07/28/2016 1040   MCV 94 07/28/2016 1040   MCH 30.6 07/28/2016 1040   MCH 30.5 01/27/2016 1252   MCHC 32.5 07/28/2016 1040   MCHC 32.8 01/27/2016 1252   RDW 17.8 (H) 07/28/2016 1040   LYMPHSABS 1.4 07/28/2016 1040   MONOABS 606 01/27/2016 1252   EOSABS 0.3 07/28/2016 1040   BASOSABS 0.1 07/28/2016 1040   CMP     Component Value Date/Time   NA 139 07/28/2016 1040   K 4.8 07/28/2016 1040   CL 104 07/28/2016 1040   CO2 18 07/28/2016 1040   GLUCOSE 178 (H) 07/28/2016 1040   GLUCOSE 114 (H) 01/27/2016 1252   BUN 15 07/28/2016 1040   CREATININE 0.65 07/28/2016 1040   CREATININE 0.66 01/27/2016 1252   CALCIUM 9.3 07/28/2016 1040   PROT 7.8 07/28/2016 1040   ALBUMIN 4.3 07/28/2016 1040   AST 13 07/28/2016 1040   ALT 13 07/28/2016 1040   ALKPHOS 64 07/28/2016 1040   BILITOT 0.2 07/28/2016 1040   GFRNONAA 88 07/28/2016 1040   GFRNONAA 88 01/27/2016 1252   GFRAA 101 07/28/2016 1040   GFRAA >89 01/27/2016 1252   TB Gold: negative (09/17/15)  Pregnancy status:  Post-menopausal  Vitals:   08/24/16 1118  BP: 118/84  Pulse: 92  Resp: 20  Temp: 99.9 F (37.7 C)    Assessment/Plan: Patient is being initiated on leflunomide (Arava) 10 mg daily for two weeks then 20 mg daily if labs are stable.  Patient was counseled on the purpose, proper use, and adverse effects of leflunomide including risk of infection, nausea/diarrhea/weight loss, increase in blood pressure, rash, hair loss, tingling in the hands and feet,  and signs and symptoms of interstitial lung disease.  Discussed the importance of frequent monitoring of liver function and blood counts, and patient was provided with instructions for standing labs.  Provided patient with educational materials on leflunomide and answered all questions.  Patient consented to [place] use, and consent will be uploaded into the media tab.  Patient was having CBC and UA drawn today to rule out infection.  Will wait for results prior to initiation of leflunomide.    Lilla Shookachel Alleene Stoy, Pharm.D., BCPS Clinical Pharmacist Pager: [phone] Phone: [phone] 08/24/2016 12:31 PM

## 2016-08-24 NOTE — Progress Notes (Signed)
Office Visit Note  Patient: Mary [name]             Date of Birth: 10-Oct-1940           MRN: [id number]             PCP: Dema Severin, NP Referring: Dema Severin, NP Visit Date: 08/24/2016 Occupation: @GUAROCC @    Subjective:  Pain of the Right Knee; Fever (99.9 pt states has not discussed with PCP ); Weakness (unable to step on scale today due to weakness); and Generalized Body Aches   History of Present Illness: Mary [name] is a 76 y.o. female with history of seronegative rheumatoid arthritis. She states that she's been having increased pain and discomfort lately. She also feels tired after her methotrexate shot for the next 2 days. She would like to switch treatment. Any her right knee joint has been swollen. She's been having soreness in her hands as well. She denies gout flare.  Activities of Daily Living:  Patient reports morning stiffness for 1 hour.   Patient Denies nocturnal pain.  Difficulty dressing/grooming: Denies Difficulty climbing stairs: Reports Difficulty getting out of chair: Reports Difficulty using hands for taps, buttons, cutlery, and/or writing: Reports   Review of Systems  Constitutional: Positive for fatigue and fever. Negative for night sweats, weight gain, weight loss and weakness.  HENT: Positive for mouth dryness. Negative for mouth sores, trouble swallowing, trouble swallowing and nose dryness.   Eyes: Positive for dryness. Negative for pain, redness and visual disturbance.  Respiratory: Negative for cough, shortness of breath and difficulty breathing.   Cardiovascular: Negative for chest pain, palpitations, hypertension, irregular heartbeat and swelling in legs/feet.  Gastrointestinal: Negative for blood in stool, constipation and diarrhea.  Endocrine: Negative for increased urination.  Genitourinary: Negative for vaginal dryness.  Musculoskeletal: Positive for arthralgias, joint pain, joint swelling and morning stiffness. Negative for  myalgias, muscle weakness, muscle tenderness and myalgias.  Skin: Negative for color change, rash, hair loss, skin tightness, ulcers and sensitivity to sunlight.  Allergic/Immunologic: Negative for susceptible to infections.  Neurological: Negative for dizziness, memory loss and night sweats.  Hematological: Negative for swollen glands.  Psychiatric/Behavioral: Negative for depressed mood and sleep disturbance. The patient is nervous/anxious.     PMFS History:  Patient Active Problem List   Diagnosis Date Noted  . 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 High risk medication use 02/28/2016  . Idiopathic chronic gout of multiple sites without tophus 02/28/2016  . Primary osteoarthritis of both knees 02/28/2016  . Flexion contractures 02/28/2016  . History of hypothyroidism 02/28/2016  . History of diabetes mellitus 02/28/2016  . History of shingles 02/28/2016  . History of hyperlipidemia 02/28/2016    Past Medical History:  Diagnosis Date  . Diabetes mellitus without complication (HCC)   . Gout   . High cholesterol   . Hypertension   . Osteoarthritis   . Rheumatoid arthritis (HCC)     No family history on file. Past Surgical History:  Procedure Laterality Date  . ABDOMINAL HYSTERECTOMY     Social History   Social History Narrative  . No narrative on file     Objective: Vital Signs: BP 118/84   Pulse 92   Temp 99.9 F (37.7 C)   Resp 20    Physical Exam  Constitutional: She is oriented to person, place, and time. She appears well-developed and well-nourished.  HENT:  Head: Normocephalic and atraumatic.  Eyes: Conjunctivae and  EOM are normal.  Neck: Normal range of motion.  Cardiovascular: Normal rate, regular rhythm, normal heart sounds and intact distal pulses.   Pulmonary/Chest: Effort normal and breath sounds normal.  Abdominal: Soft. Bowel sounds are normal.  Lymphadenopathy:    She has no cervical  adenopathy.  Neurological: She is alert and oriented to person, place, and time.  Skin: Skin is warm and dry. Capillary refill takes less than 2 seconds.  Psychiatric: She has a normal mood and affect. Her behavior is normal.  Nursing note and vitals reviewed.    Musculoskeletal Exam: C-spine and thoracic lumbar spine limited range of motion. Shoulder joint abduction was limited to 90. She is contracture in her bilateral elbows. She is very limited range of motion of her wrist joints. She has some subluxation of most of her MCP joints. She has synovitis on palpation of her bilateral wrist joints more prominent in her right wrist. Hip joints were difficult to assess as she was not able to get up from the chair. Her right knee joint was swollen. She has thickening over bilateral ankle joints. She has overcrowding of her toes.  CDAI Exam: CDAI Homunculus Exam:   Tenderness:  RUE: wrist LUE: wrist RLE: tibiofemoral  Swelling:  RUE: wrist RLE: tibiofemoral  Joint Counts:  CDAI Tender Joint count: 3 CDAI Swollen Joint count: 2  Global Assessments:  Patient Global Assessment: 4 Provider Global Assessment: 4  CDAI Calculated Score: 13    Investigation: No additional findings. CBC Latest Ref Rng & Units 07/28/2016 04/28/2016 01/27/2016  WBC 3.4 - 10.8 x10E3/uL 6.6 8.6 10.1  Hemoglobin 11.1 - 15.9 g/dL 16.111.4 09.611.3 04.511.7  Hematocrit 34.0 - 46.6 % 35.1 33.8(L) 35.7  Platelets 150 - 379 x10E3/uL 342 295 334    CMP Latest Ref Rng & Units 07/28/2016 04/28/2016 01/27/2016  Glucose 65 - 99 mg/dL 409(W178(H) 119(J121(H) 478(G114(H)  BUN 8 - 27 mg/dL 15 16 23   Creatinine 0.57 - 1.00 mg/dL 9.560.65 2.130.63 0.860.66  Sodium 134 - 144 mmol/L 139 136 137  Potassium 3.5 - 5.2 mmol/L 4.8 5.1 4.4  Chloride 96 - 106 mmol/L 104 98 103  CO2 18 - 29 mmol/L 18 19 19(L)  Calcium 8.7 - 10.3 mg/dL 9.3 9.6 9.6  Total Protein 6.0 - 8.5 g/dL 7.8 7.8 7.9  Total Bilirubin 0.0 - 1.2 mg/dL 0.2 0.3 0.3  Alkaline Phos 39 - 117 IU/L 64 68  66  AST 0 - 40 IU/L 13 11 13   ALT 0 - 32 IU/L 13 11 11     Imaging: No results found.  Speciality Comments: No specialty comments available.    Procedures:  No procedures performed Allergies: Aspirin and Codeine   Assessment / Plan:     Visit Diagnoses: Rheumatoid arthritis of multiple sites with negative rheumatoid factor (HCC): Overall she is doing better on methotrexate but she continues to have synovitis. Her arthritis is not very well controlled. She wants to switch from methotrexate to some other DMARD due to ongoing fatigue. Indications side effects contraindications of different medications were discussed. She is in agreement to proceed with leflunomide. I will start her on leflunomide 10 mg by mouth daily for 2 weeks if her labs are stable we will increase the dose. She will need monitoring of labs every 2 months while she is on the medication. It is possible that she many more aggressive therapy down the road.  High risk medication use - Methotrexate 0.6 mL subcutaneous every week, folic acid 2 mg by  mouth daily  Idiopathic chronic gout of multiple sites without tophus - Allopurinol 100 mg by mouth daily. She did not have any recent gout flare.  Primary osteoarthritis of both knees: Chronic pain and discomfort. She has some swelling of her right knee joint.  Dysuria and low-grade fever: I'll check her CBC and UA today. I've also advised to schedule an appointment with her PCP as soon as possible.  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  History of diabetes mellitus: Blood sugar is still not well controlled.  History of hyperlipidemia  History of hypothyroidism  History of shingles    Orders: Orders Placed This Encounter  Procedures  . CBC with Differential/Platelet  . Urinalysis, Routine w reflex microscopic  . CBC with Differential/Platelet  . COMPLETE METABOLIC PANEL WITH GFR   No orders of the defined types were placed in this  encounter.   Face-to-face time spent with patient was 30 minutes. 50% of time was spent in counseling and coordination of care.  Follow-Up Instructions: Return in about 3 months (around 11/24/2016) for Rheumatoid arthritis.   Pollyann Savoy, MD  Note - This record has been created using Animal nutritionist.  Chart creation errors have been sought, but may not always  have been located. Such creation errors do not reflect on  the standard of medical care.

## 2016-08-24 NOTE — Patient Instructions (Addendum)
Stop methotrexate and folic acid  Start taking leflunomide (Arava) 10 mg by mouth daily.  Please get labs two weeks after starting the medication.  If your labs are stable, we will plan to increase that dose to 20 mg by mouth daily.  Please get labs again two weeks after increasing your dose.    Standing Labs We placed an order today for your standing lab work.    Please come back and get your standing labs in 2 weeks x 2 then every 2 months.  We have open lab Monday through Friday from 8:30-11:30 AM and 1:30-4 PM at the office of Dr. Arbutus PedShaili Montrae Braithwaite/Naitik Panwala, PA.   The office is located at [address], [address], [place], [place] [address] No appointment is necessary.   [facility]abs are [facility] by [facility].  You may receive a bill from [facility] for your lab work. If you have any questions regarding directions or hours of operation,  please call [phone].     Leflunomide tablets What is this medicine? LEFLUNOMIDE (le FLOO na mide) is for rheumatoid arthritis. This medicine may be used for other purposes; ask your health care provider or pharmacist if you have questions. COMMON BRAND NAME(S): Arava What should I tell my health care provider before I take this medicine? They need to know if you have any of these conditions: -alcoholism -bone marrow problems -fever or infection -immune system problems -kidney disease -liver disease -an unusual or allergic reaction to leflunomide, teriflunomide, other medicines, lactose, foods, dyes, or preservatives -pregnant or trying to get pregnant -breast-feeding How should I use this medicine? Take this medicine by mouth with a full glass of water. Follow the directions on the prescription label. Take your medicine at regular intervals. Do not take your medicine more often than directed. Do not stop taking except on your doctor's advice. Talk to your pediatrician regarding the use of this medicine in children. Special care may be  needed. Overdosage: If you think you have taken too much of this medicine contact a poison control center or emergency room at once. NOTE: This medicine is only for you. Do not share this medicine with others. What if I miss a dose? If you miss a dose, take it as soon as you can. If it is almost time for your next dose, take only that dose. Do not take double or extra doses. What may interact with this medicine? Do not take this medicine with any of the following medications: -teriflunomide This medicine may also interact with the following medications: -charcoal -cholestyramine -methotrexate -NSAIDs, medicines for pain and inflammation, like ibuprofen or naproxen -phenytoin -rifampin -tolbutamide -vaccines -warfarin This list may not describe all possible interactions. Give your health care provider a list of all the medicines, herbs, non-prescription drugs, or dietary supplements you use. Also tell them if you smoke, drink alcohol, or use illegal drugs. Some items may interact with your medicine. What should I watch for while using this medicine? Visit your doctor or health care professional for regular checks on your progress. You will need frequent blood checks while you are receiving the medicine. If you get a cold or other infection while receiving this medicine, call your doctor or health care professional. Do not treat yourself. The medicine may increase your risk of getting an infection. If you are a woman who has the potential to become pregnant, discuss birth control options with your doctor or health care professional. Bonita QuinYou must not be pregnant, and you must be using a  reliable form of birth control. The medicine may harm an unborn baby. Immediately call your doctor if you think you might be pregnant. Alcoholic drinks may increase possible damage to your liver. Do not drink alcohol while taking this medicine. What side effects may I notice from receiving this medicine? Side  effects that you should report to your doctor or health care professional as soon as possible: -allergic reactions like skin rash, itching or hives, swelling of the face, lips, or tongue -cough -difficulty breathing or shortness of breath -fever, chills or any other sign of infection -redness, blistering, peeling or loosening of the skin, including inside the mouth -unusual bleeding or bruising -unusually weak or tired -vomiting -yellowing of eyes or skin Side effects that usually do not require medical attention (report to your doctor or health care professional if they continue or are bothersome): -diarrhea -hair loss -headache -nausea This list may not describe all possible side effects. Call your doctor for medical advice about side effects. You may report side effects to FDA at 1-800-FDA-1088. Where should I keep my medicine? Keep out of the reach of children. Store at room temperature between 15 and 30 degrees C (59 and 86 degrees F). Protect from moisture and light. Throw away any unused medicine after the expiration date. NOTE: This sheet is a summary. It may not cover all possible information. If you have questions about this medicine, talk to your doctor, pharmacist, or health care provider.  2018 Elsevier/Gold Standard (2013-02-14 10:53:11)

## 2016-08-25 LAB — URINALYSIS, MICROSCOPIC ONLY
Casts: NONE SEEN [LPF]
Crystals: NONE SEEN [HPF]
RBC / HPF: NONE SEEN RBC/HPF (ref <=2)
Squamous Epithelial / LPF: NONE SEEN [HPF] (ref <=5)
Yeast: NONE SEEN [HPF]

## 2016-08-25 LAB — URINALYSIS, ROUTINE W REFLEX MICROSCOPIC
Bilirubin Urine: NEGATIVE
Glucose, UA: NEGATIVE
HGB URINE DIPSTICK: NEGATIVE
KETONES UR: NEGATIVE
NITRITE: POSITIVE — AB
Specific Gravity, Urine: 1.018 (ref 1.001–1.035)
pH: 5.5 (ref 5.0–8.0)

## 2016-08-25 NOTE — Progress Notes (Signed)
Patient has urinary tract infection. She should go see her PCP for urine culture and treatment of UTI. I tried calling patient and could not reach her. We have holding her methotrexate and plan to start her on Arava after her urinary tract infection clears up.

## 2016-08-30 DIAGNOSIS — R0902 Hypoxemia: Secondary | ICD-10-CM

## 2016-08-30 HISTORY — DX: Hypoxemia: R09.02

## 2016-09-03 ENCOUNTER — Encounter (HOSPITAL_COMMUNITY): Payer: Self-pay | Admitting: Emergency Medicine

## 2016-09-03 ENCOUNTER — Emergency Department (HOSPITAL_COMMUNITY): Payer: Medicare HMO

## 2016-09-03 ENCOUNTER — Other Ambulatory Visit (HOSPITAL_COMMUNITY): Payer: Medicare HMO

## 2016-09-03 ENCOUNTER — Inpatient Hospital Stay (HOSPITAL_COMMUNITY)
Admission: EM | Admit: 2016-09-03 | Discharge: 2016-09-07 | DRG: 189 | Disposition: A | Discharge status: Home Health | Payer: Medicare HMO | Attending: Internal Medicine | Admitting: Internal Medicine

## 2016-09-03 DIAGNOSIS — F419 Anxiety disorder, unspecified: Secondary | ICD-10-CM | POA: Diagnosis present

## 2016-09-03 DIAGNOSIS — Z8639 Personal history of other endocrine, nutritional and metabolic disease: Secondary | ICD-10-CM

## 2016-09-03 DIAGNOSIS — I2510 Atherosclerotic heart disease of native coronary artery without angina pectoris: Secondary | ICD-10-CM | POA: Diagnosis present

## 2016-09-03 DIAGNOSIS — Z886 Allergy status to analgesic agent status: Secondary | ICD-10-CM

## 2016-09-03 DIAGNOSIS — Z7983 Long term (current) use of bisphosphonates: Secondary | ICD-10-CM

## 2016-09-03 DIAGNOSIS — R918 Other nonspecific abnormal finding of lung field: Secondary | ICD-10-CM

## 2016-09-03 DIAGNOSIS — E785 Hyperlipidemia, unspecified: Secondary | ICD-10-CM | POA: Diagnosis present

## 2016-09-03 DIAGNOSIS — R002 Palpitations: Secondary | ICD-10-CM | POA: Diagnosis not present

## 2016-09-03 DIAGNOSIS — R0902 Hypoxemia: Secondary | ICD-10-CM | POA: Diagnosis not present

## 2016-09-03 DIAGNOSIS — M0609 Rheumatoid arthritis without rheumatoid factor, multiple sites: Secondary | ICD-10-CM | POA: Diagnosis present

## 2016-09-03 DIAGNOSIS — M109 Gout, unspecified: Secondary | ICD-10-CM | POA: Diagnosis present

## 2016-09-03 DIAGNOSIS — R590 Localized enlarged lymph nodes: Secondary | ICD-10-CM

## 2016-09-03 DIAGNOSIS — R0609 Other forms of dyspnea: Secondary | ICD-10-CM

## 2016-09-03 DIAGNOSIS — E875 Hyperkalemia: Secondary | ICD-10-CM | POA: Diagnosis present

## 2016-09-03 DIAGNOSIS — Z800 Family history of malignant neoplasm of digestive organs: Secondary | ICD-10-CM

## 2016-09-03 DIAGNOSIS — K219 Gastro-esophageal reflux disease without esophagitis: Secondary | ICD-10-CM | POA: Diagnosis present

## 2016-09-03 DIAGNOSIS — E7800 Pure hypercholesterolemia, unspecified: Secondary | ICD-10-CM | POA: Diagnosis present

## 2016-09-03 DIAGNOSIS — J810 Acute pulmonary edema: Secondary | ICD-10-CM | POA: Diagnosis not present

## 2016-09-03 DIAGNOSIS — N390 Urinary tract infection, site not specified: Secondary | ICD-10-CM

## 2016-09-03 DIAGNOSIS — I10 Essential (primary) hypertension: Secondary | ICD-10-CM

## 2016-09-03 DIAGNOSIS — M1A09X0 Idiopathic chronic gout, multiple sites, without tophus (tophi): Secondary | ICD-10-CM | POA: Diagnosis present

## 2016-09-03 DIAGNOSIS — Z7984 Long term (current) use of oral hypoglycemic drugs: Secondary | ICD-10-CM

## 2016-09-03 DIAGNOSIS — I4891 Unspecified atrial fibrillation: Secondary | ICD-10-CM | POA: Diagnosis present

## 2016-09-03 DIAGNOSIS — J9601 Acute respiratory failure with hypoxia: Secondary | ICD-10-CM | POA: Diagnosis not present

## 2016-09-03 DIAGNOSIS — Z818 Family history of other mental and behavioral disorders: Secondary | ICD-10-CM

## 2016-09-03 DIAGNOSIS — Z885 Allergy status to narcotic agent status: Secondary | ICD-10-CM

## 2016-09-03 DIAGNOSIS — Z90710 Acquired absence of both cervix and uterus: Secondary | ICD-10-CM

## 2016-09-03 DIAGNOSIS — K449 Diaphragmatic hernia without obstruction or gangrene: Secondary | ICD-10-CM | POA: Diagnosis present

## 2016-09-03 DIAGNOSIS — Z8261 Family history of arthritis: Secondary | ICD-10-CM

## 2016-09-03 DIAGNOSIS — E119 Type 2 diabetes mellitus without complications: Secondary | ICD-10-CM | POA: Diagnosis present

## 2016-09-03 DIAGNOSIS — E872 Acidosis: Secondary | ICD-10-CM | POA: Diagnosis present

## 2016-09-03 DIAGNOSIS — E039 Hypothyroidism, unspecified: Secondary | ICD-10-CM | POA: Diagnosis present

## 2016-09-03 DIAGNOSIS — M810 Age-related osteoporosis without current pathological fracture: Secondary | ICD-10-CM | POA: Diagnosis present

## 2016-09-03 DIAGNOSIS — J9600 Acute respiratory failure, unspecified whether with hypoxia or hypercapnia: Secondary | ICD-10-CM

## 2016-09-03 DIAGNOSIS — M0510 Rheumatoid lung disease with rheumatoid arthritis of unspecified site: Secondary | ICD-10-CM

## 2016-09-03 HISTORY — DX: Nausea with vomiting, unspecified: R11.2

## 2016-09-03 HISTORY — DX: Adverse effect of unspecified anesthetic, initial encounter: T41.45XA

## 2016-09-03 HISTORY — DX: Dyspnea, unspecified: R06.00

## 2016-09-03 HISTORY — DX: Hypoxemia: R09.02

## 2016-09-03 HISTORY — DX: Other complications of anesthesia, initial encounter: T88.59XA

## 2016-09-03 HISTORY — DX: Personal history of urinary calculi: Z87.442

## 2016-09-03 HISTORY — DX: Other specified postprocedural states: Z98.890

## 2016-09-03 LAB — CBC WITH DIFFERENTIAL/PLATELET
BASOS ABS: 0.1 10*3/uL (ref 0.0–0.1)
Basophils Relative: 1 %
Eosinophils Absolute: 0.3 10*3/uL (ref 0.0–0.7)
Eosinophils Relative: 4 %
HEMATOCRIT: 35.9 % — AB (ref 36.0–46.0)
Hemoglobin: 11.4 g/dL — ABNORMAL LOW (ref 12.0–15.0)
LYMPHS ABS: 2 10*3/uL (ref 0.7–4.0)
LYMPHS PCT: 27 %
MCH: 29.2 pg (ref 26.0–34.0)
MCHC: 31.8 g/dL (ref 30.0–36.0)
MCV: 91.8 fL (ref 78.0–100.0)
MONO ABS: 0.4 10*3/uL (ref 0.1–1.0)
Monocytes Relative: 5 %
NEUTROS ABS: 4.7 10*3/uL (ref 1.7–7.7)
Neutrophils Relative %: 63 %
Platelets: 412 10*3/uL — ABNORMAL HIGH (ref 150–400)
RBC: 3.91 MIL/uL (ref 3.87–5.11)
RDW: 17.9 % — AB (ref 11.5–15.5)
WBC: 7.5 10*3/uL (ref 4.0–10.5)

## 2016-09-03 LAB — GLUCOSE, CAPILLARY
Glucose-Capillary: 203 mg/dL — ABNORMAL HIGH (ref 65–99)
Glucose-Capillary: 117 mg/dL — ABNORMAL HIGH (ref 65–99)
Glucose-Capillary: 304 mg/dL — ABNORMAL HIGH (ref 65–99)

## 2016-09-03 LAB — COMPREHENSIVE METABOLIC PANEL
ALBUMIN: 3.5 g/dL (ref 3.5–5.0)
ALT: 10 U/L — AB (ref 14–54)
AST: 19 U/L (ref 15–41)
Alkaline Phosphatase: 63 U/L (ref 38–126)
Anion gap: 9 (ref 5–15)
BUN: 23 mg/dL — AB (ref 6–20)
CHLORIDE: 109 mmol/L (ref 101–111)
CO2: 19 mmol/L — AB (ref 22–32)
CREATININE: 0.96 mg/dL (ref 0.44–1.00)
Calcium: 9.6 mg/dL (ref 8.9–10.3)
GFR calc Af Amer: >60 mL/min (ref >60)
GFR, EST NON AFRICAN AMERICAN: 57 mL/min — AB (ref >60)
Glucose, Bld: 135 mg/dL — ABNORMAL HIGH (ref 65–99)
POTASSIUM: 5.3 mmol/L — AB (ref 3.5–5.1)
SODIUM: 137 mmol/L (ref 135–145)
Total Bilirubin: 0.4 mg/dL (ref 0.3–1.2)
Total Protein: 7.3 g/dL (ref 6.5–8.1)

## 2016-09-03 LAB — URINALYSIS, ROUTINE W REFLEX MICROSCOPIC
Bilirubin Urine: NEGATIVE
Glucose, UA: NEGATIVE mg/dL
Hgb urine dipstick: NEGATIVE
Ketones, ur: NEGATIVE mg/dL
Leukocytes, UA: NEGATIVE
NITRITE: NEGATIVE
PH: 6 (ref 5.0–8.0)
Protein, ur: NEGATIVE mg/dL
SPECIFIC GRAVITY, URINE: 1.008 (ref 1.005–1.030)

## 2016-09-03 LAB — BRAIN NATRIURETIC PEPTIDE: B NATRIURETIC PEPTIDE 5: 14.6 pg/mL (ref 0.0–100.0)

## 2016-09-03 LAB — I-STAT TROPONIN, ED: Troponin i, poc: 0 ng/mL (ref 0.00–0.08)

## 2016-09-03 LAB — MAGNESIUM: MAGNESIUM: 1.7 mg/dL (ref 1.7–2.4)

## 2016-09-03 LAB — SEDIMENTATION RATE: Sed Rate: 47 mm/hr — ABNORMAL HIGH (ref 0–22)

## 2016-09-03 LAB — C-REACTIVE PROTEIN: CRP: 2.1 mg/dL — ABNORMAL HIGH (ref <1.0)

## 2016-09-03 MED ORDER — FOLIC ACID 1 MG PO TABS
2.0000 mg | ORAL_TABLET | Freq: Every day | ORAL | Status: DC
  Administered 2016-09-03 – 2016-09-07 (×5): 2 mg via ORAL
  Filled 2016-09-03 (×5): qty 2

## 2016-09-03 MED ORDER — CLORAZEPATE DIPOTASSIUM 3.75 MG PO TABS
7.5000 mg | ORAL_TABLET | Freq: Two times a day (BID) | ORAL | Status: DC | PRN
  Administered 2016-09-06: 7.5 mg via ORAL
  Filled 2016-09-03: qty 2

## 2016-09-03 MED ORDER — ATORVASTATIN CALCIUM 10 MG PO TABS
10.0000 mg | ORAL_TABLET | Freq: Every day | ORAL | Status: DC
  Administered 2016-09-03 – 2016-09-06 (×4): 10 mg via ORAL
  Filled 2016-09-03 (×4): qty 1

## 2016-09-03 MED ORDER — CLORAZEPATE DIPOTASSIUM 7.5 MG PO TABS: 7.5000 mg | ORAL_TABLET | Freq: Two times a day (BID) | ORAL | Status: DC | PRN

## 2016-09-03 MED ORDER — VITAMIN D3 125 MCG (5000 UT) PO TABS: 1.0000 | ORAL_TABLET | Freq: Every day | ORAL | Status: DC

## 2016-09-03 MED ORDER — ALLOPURINOL 100 MG PO TABS
100.0000 mg | ORAL_TABLET | Freq: Two times a day (BID) | ORAL | Status: DC
  Administered 2016-09-03 – 2016-09-07 (×9): 100 mg via ORAL
  Filled 2016-09-03 (×9): qty 1

## 2016-09-03 MED ORDER — ALBUTEROL SULFATE (2.5 MG/3ML) 0.083% IN NEBU: 2.5000 mg | INHALATION_SOLUTION | RESPIRATORY_TRACT | Status: DC | PRN

## 2016-09-03 MED ORDER — LEVOTHYROXINE SODIUM 75 MCG PO TABS
75.0000 ug | ORAL_TABLET | Freq: Every day | ORAL | Status: DC
  Administered 2016-09-04 – 2016-09-07 (×4): 75 ug via ORAL
  Filled 2016-09-03 (×4): qty 1

## 2016-09-03 MED ORDER — INSULIN ASPART 100 UNIT/ML ~~LOC~~ SOLN
0.0000 [IU] | Freq: Three times a day (TID) | SUBCUTANEOUS | Status: DC
  Administered 2016-09-03: 3 [IU] via SUBCUTANEOUS

## 2016-09-03 MED ORDER — SODIUM CHLORIDE 0.9% FLUSH
3.0000 mL | Freq: Two times a day (BID) | INTRAVENOUS | Status: DC
  Administered 2016-09-03 – 2016-09-07 (×8): 3 mL via INTRAVENOUS

## 2016-09-03 MED ORDER — POLYETHYLENE GLYCOL 3350 17 G PO PACK
17.0000 g | PACK | Freq: Every day | ORAL | Status: DC | PRN
  Administered 2016-09-06: 17 g via ORAL
  Filled 2016-09-03: qty 1

## 2016-09-03 MED ORDER — ACETAMINOPHEN 650 MG RE SUPP: 650.0000 mg | Freq: Four times a day (QID) | RECTAL | Status: DC | PRN

## 2016-09-03 MED ORDER — METHYLPREDNISOLONE SODIUM SUCC 125 MG IJ SOLR
60.0000 mg | Freq: Three times a day (TID) | INTRAMUSCULAR | Status: DC
  Administered 2016-09-03 – 2016-09-06 (×9): 60 mg via INTRAVENOUS
  Filled 2016-09-03 (×9): qty 2

## 2016-09-03 MED ORDER — INSULIN ASPART 100 UNIT/ML ~~LOC~~ SOLN
0.0000 [IU] | Freq: Every day | SUBCUTANEOUS | Status: DC
  Administered 2016-09-03: 4 [IU] via SUBCUTANEOUS
  Administered 2016-09-04: 3 [IU] via SUBCUTANEOUS
  Administered 2016-09-05: 2 [IU] via SUBCUTANEOUS

## 2016-09-03 MED ORDER — ENOXAPARIN SODIUM 40 MG/0.4ML ~~LOC~~ SOLN
40.0000 mg | Freq: Every day | SUBCUTANEOUS | Status: DC
  Administered 2016-09-03 – 2016-09-07 (×5): 40 mg via SUBCUTANEOUS
  Filled 2016-09-03 (×5): qty 0.4

## 2016-09-03 MED ORDER — CARVEDILOL 6.25 MG PO TABS
6.2500 mg | ORAL_TABLET | Freq: Two times a day (BID) | ORAL | Status: DC
  Administered 2016-09-03 – 2016-09-07 (×8): 6.25 mg via ORAL
  Filled 2016-09-03 (×8): qty 1

## 2016-09-03 MED ORDER — IOPAMIDOL (ISOVUE-370) INJECTION 76%
INTRAVENOUS | Status: AC
  Administered 2016-09-03: 100 mL
  Filled 2016-09-03: qty 100

## 2016-09-03 MED ORDER — IPRATROPIUM-ALBUTEROL 0.5-2.5 (3) MG/3ML IN SOLN
3.0000 mL | Freq: Four times a day (QID) | RESPIRATORY_TRACT | Status: DC
  Administered 2016-09-03 (×2): 3 mL via RESPIRATORY_TRACT
  Filled 2016-09-03 (×2): qty 3

## 2016-09-03 MED ORDER — ACETAMINOPHEN 325 MG PO TABS: 650.0000 mg | ORAL_TABLET | Freq: Four times a day (QID) | ORAL | Status: DC | PRN

## 2016-09-03 MED ORDER — INSULIN ASPART 100 UNIT/ML ~~LOC~~ SOLN
0.0000 [IU] | Freq: Three times a day (TID) | SUBCUTANEOUS | Status: DC
  Administered 2016-09-04: 3 [IU] via SUBCUTANEOUS
  Administered 2016-09-04: 15 [IU] via SUBCUTANEOUS
  Administered 2016-09-04 – 2016-09-05 (×3): 7 [IU] via SUBCUTANEOUS
  Administered 2016-09-05: 8 [IU] via SUBCUTANEOUS
  Administered 2016-09-06: 4 [IU] via SUBCUTANEOUS
  Administered 2016-09-06: 11 [IU] via SUBCUTANEOUS

## 2016-09-03 MED ORDER — VITAMIN D 1000 UNITS PO TABS
5000.0000 [IU] | ORAL_TABLET | Freq: Every day | ORAL | Status: DC
  Administered 2016-09-03 – 2016-09-07 (×5): 5000 [IU] via ORAL
  Filled 2016-09-03 (×5): qty 5

## 2016-09-03 NOTE — ED Provider Notes (Signed)
Received patient in turnover from Dr. Lynelle DoctorKnapp, briefly patient is a 76 year old female with the chief complaints of palpitations. Found to be acutely hypoxic here in the ED on exertion. CT angiogram of the chest looking for PE was negative however the patient had groundglass appearing nonspecific changes to her lungs. Discussed with the hospitalist.  Admit.    Melene PlanFloyd, Daxton Nydam, DO 09/03/16 1204

## 2016-09-03 NOTE — ED Provider Notes (Signed)
[facility]-EMERGENCY DEPT Provider Note   CSN: [id number] Arrival date & time: 09/03/16  0307  Time seen 04:40 AM   History   Chief Complaint Chief Complaint  Patient presents with  . Atrial Fibrillation    HPI Mary [name] is a 76 y.o. female.  HPI  patient states she woke up around midnight and felt like her heart was fluttering. She states she took a nerve pill and it helped. She states it lasted about an hour. She denies chest pain but states it did make her feel short of breath. She denies coughing. She states she's had it before it normally goes away when she takes her nerve pill. She has discussed this with her doctor he feels like it's from anxiety. She states it happens about once a month. She states she was worrying tonight because she had a Escherichia coli urinary tract infection. She has been on antibiotic now for almost a week and has 3 more pills of Septra DS to take. She did have a fever last week with it. She denies dysuria or frequency. She states her urinary tract symptoms are feeling better however she still concerned because of the Escherichia coli. She states she was started on a different antibiotic however her culture came back they put her on the Septra DS. She denies any fever or abdominal pain now. Looking at her prescription she was prescribed Septra DS on June 29 for 7 days. Family also notes she's been very short of breath recently and especially with exertion. They state her breathing has been labored. She is not on oxygen at home however when she arrived in the ED her pulse ox was 88% on room air. Patient has a history of rheumatoid arthritis.  PCP Dema Severin, NP   Past Medical History:  Diagnosis Date  . Diabetes mellitus without complication (HCC)   . Gout   . High cholesterol   . Hypertension   . Osteoarthritis   . Rheumatoid arthritis [facility])     Patient Active Problem List   Diagnosis Date Noted  . 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 High risk medication use 02/28/2016  . Idiopathic chronic gout of multiple sites without tophus 02/28/2016  . Primary osteoarthritis of both knees 02/28/2016  . Flexion contractures 02/28/2016  . History of hypothyroidism 02/28/2016  . History of diabetes mellitus 02/28/2016  . History of shingles 02/28/2016  . History of hyperlipidemia 02/28/2016    Past Surgical History:  Procedure Laterality Date  . ABDOMINAL HYSTERECTOMY      OB History    No data available       Home Medications    Prior to Admission medications   Medication Sig Start Date End Date Taking? Authorizing Provider  alendronate (FOSAMAX) 70 MG tablet Take 1 tablet (70 mg total) by mouth once a week. Take with a full glass of water on an empty stomach. 06/22/16   Pollyann Savoy, MD  allopurinol (ZYLOPRIM) 100 MG tablet  08/05/15   [provider]  amLODipine-benazepril (LOTREL) 5-20 MG capsule Take by mouth.    [provider]  atorvastatin (LIPITOR) 10 MG tablet  08/16/15   [provider]  carvedilol (COREG) 6.25 MG tablet Take 6.25 mg by mouth.    [provider]  Cholecalciferol (VITAMIN D3) 5000 units TABS Take by mouth.    [provider]  clorazepate (TRANXENE) 7.5 MG tablet Take 7.5 mg by mouth.    [provider]  folic acid (FOLVITE) 1 MG tablet Take 2 tablets (2 mg total) by mouth daily. 03/04/16 05/28/17  Eliezer Lofts, PA-C  levothyroxine (SYNTHROID, LEVOTHROID) 75 MCG tablet  10/24/15   [provider]  metFORMIN (GLUCOPHAGE) 500 MG tablet Take 500 mg by mouth.    [provider]  methotrexate 50 MG/2ML injection Inject 0.26m sub q weekly 03/04/16   Panwala, Naitik, PA-C  Tuberculin-Allergy Syringes 27G X 1/2" 1 ML KIT Inject 1 Syringe into the skin once a week. If syringe size greater than 176m please call me at my office. 03/04/16 03/04/17  PaEliezer LoftsPA-C    Family History No  family history on file.  Social History Social History  Substance Use Topics  . Smoking status: Never Smoker  . Smokeless tobacco: Never Used  . Alcohol use No  lives at home Lives with spouse   Allergies   Aspirin and Codeine   Review of Systems Review of Systems  All other systems reviewed and are negative.    Physical Exam Updated Vital Signs BP 129/77   Pulse 94   Temp 98.9 F (37.2 C) (Oral)   Resp 17   Ht _0  (1.6 m)   Wt 72.6 kg (160 lb)   SpO2 97%   BMI 28.34 kg/m   Vital signs normal    Physical Exam  Constitutional: She is oriented to person, place, and time. She appears well-developed and well-nourished.  Non-toxic appearance. She does not appear ill. No distress.  HENT:  Head: Normocephalic and atraumatic.  Right Ear: External ear normal.  Left Ear: External ear normal.  Nose: Nose normal. No mucosal edema or rhinorrhea.  Mouth/Throat: Oropharynx is clear and moist and mucous membranes are normal. No dental abscesses or uvula swelling.  Eyes: Conjunctivae and EOM are normal. Pupils are equal, round, and reactive to light.  Neck: Normal range of motion and full passive range of motion without pain. Neck supple.  Cardiovascular: Normal rate, regular rhythm and normal heart sounds.  Exam reveals no gallop and no friction rub.   No murmur heard. Pulmonary/Chest: Effort normal and breath sounds normal. No respiratory distress. She has no wheezes. She has no rhonchi. She has no rales. She exhibits no tenderness and no crepitus.  Abdominal: Soft. Normal appearance and bowel sounds are normal. She exhibits no distension. There is no tenderness. There is no rebound and no guarding.  Musculoskeletal: Normal range of motion. She exhibits deformity. She exhibits no edema or tenderness.  Patient has marked deformity of her hands, right worse than the left consistent with rheumatoid arthritis with ulnar deviation.  Neurological: She is alert and oriented to  person, place, and time. She has normal strength. No cranial nerve deficit.  Skin: Skin is warm, dry and intact. No rash noted. No erythema. No pallor.  Psychiatric: She has a normal mood and affect. Her speech is normal and behavior is normal. Her mood appears not anxious.  Nursing note and vitals reviewed.    ED Treatments / Results  Labs (all labs ordered are listed, but only abnormal results are displayed) Results for orders placed or performed during the hospital encounter of 09/03/16  Comprehensive metabolic panel  Result Value Ref Range   Sodium 137 135 - 145 mmol/L   Potassium 5.3 (H) 3.5 - 5.1 mmol/L   Chloride 109 101 - 111 mmol/L   CO2 19 (L) 22 - 32 mmol/L   Glucose, Bld 135 (H) 65 - 99 mg/dL  BUN 23 (H) 6 - 20 mg/dL   Creatinine, Ser 0.96 0.44 - 1.00 mg/dL   Calcium 9.6 8.9 - 10.3 mg/dL   Total Protein 7.3 6.5 - 8.1 g/dL   Albumin 3.5 3.5 - 5.0 g/dL   AST 19 15 - 41 U/L   ALT 10 (L) 14 - 54 U/L   Alkaline Phosphatase 63 38 - 126 U/L   Total Bilirubin 0.4 0.3 - 1.2 mg/dL   GFR calc non Af Amer 57 (L) >60 mL/min   GFR calc Af Amer >60 >60 mL/min   Anion gap 9 5 - 15  Brain natriuretic peptide  Result Value Ref Range   B Natriuretic Peptide 14.6 0.0 - 100.0 pg/mL  CBC with Differential  Result Value Ref Range   WBC 7.5 4.0 - 10.5 K/uL   RBC 3.91 3.87 - 5.11 MIL/uL   Hemoglobin 11.4 (L) 12.0 - 15.0 g/dL   HCT 35.9 (L) 36.0 - 46.0 %   MCV 91.8 78.0 - 100.0 fL   MCH 29.2 26.0 - 34.0 pg   MCHC 31.8 30.0 - 36.0 g/dL   RDW 17.9 (H) 11.5 - 15.5 %   Platelets 412 (H) 150 - 400 K/uL   Neutrophils Relative % 63 %   Neutro Abs 4.7 1.7 - 7.7 K/uL   Lymphocytes Relative 27 %   Lymphs Abs 2.0 0.7 - 4.0 K/uL   Monocytes Relative 5 %   Monocytes Absolute 0.4 0.1 - 1.0 K/uL   Eosinophils Relative 4 %   Eosinophils Absolute 0.3 0.0 - 0.7 K/uL   Basophils Relative 1 %   Basophils Absolute 0.1 0.0 - 0.1 K/uL  Urinalysis, Routine w reflex microscopic  Result Value Ref  Range   Color, Urine STRAW (A) YELLOW   APPearance CLEAR CLEAR   Specific Gravity, Urine 1.008 1.005 - 1.030   pH 6.0 5.0 - 8.0   Glucose, UA NEGATIVE NEGATIVE mg/dL   Hgb urine dipstick NEGATIVE NEGATIVE   Bilirubin Urine NEGATIVE NEGATIVE   Ketones, ur NEGATIVE NEGATIVE mg/dL   Protein, ur NEGATIVE NEGATIVE mg/dL   Nitrite NEGATIVE NEGATIVE   Leukocytes, UA NEGATIVE NEGATIVE  Magnesium  Result Value Ref Range   Magnesium 1.7 1.7 - 2.4 mg/dL  I-stat troponin, ED  Result Value Ref Range   Troponin i, poc 0.00 0.00 - 0.08 ng/mL   Comment 3           Laboratory interpretation all normal except mild anemia    EKG  EKG Interpretation  Date/Time:  Thursday September 03 2016 03:14:39 EDT Ventricular Rate:  97 PR Interval:    QRS Duration: 91 QT Interval:  333 QTC Calculation: 423 R Axis:   17 Text Interpretation:  Sinus rhythm Abnormal R-wave progression, early transition No old tracing to compare Confirmed by Rolland Porter [phone]) on 09/03/2016 3:33:52 AM       Radiology Dg Chest 2 View  Result Date: 09/03/2016 CLINICAL DATA:  Acute onset of shortness of breath and decreased O2 saturation. Tachycardia. Initial encounter. EXAM: CHEST  2 VIEW COMPARISON:  None. FINDINGS: The lungs are well-aerated. Vascular congestion is noted. Hazy interstitial prominence raises concern for pulmonary edema, more prominent on the left. There is no evidence of pleural effusion or pneumothorax. The heart is borderline enlarged. No acute osseous abnormalities are seen. IMPRESSION: Vascular congestion and borderline cardiomegaly. Hazy interstitial prominence raises concern for pulmonary edema, more prominent on the left. Electronically Signed   By: Garald Balding M.D.   On:  09/03/2016 05:51    Procedures Procedures (including critical care time)  Medications Ordered in ED Medications - No data to display   Initial Impression / Assessment and Plan / ED Course  I have reviewed the triage vital signs and  the nursing notes.  Pertinent labs & imaging results that were available during my care of the patient were reviewed by me and considered in my medical decision making (see chart for details).  Laboratory testing was ordered. Patient was noted to have a heart rate in the 90s during my exam.  6:45 AM I discussed her test results with patient and her family. I'm going to have nursing staff ambulator with a pulse ox to see if she becomes hypoxic. Patient states she is able to ambulate short distances.  Nursing staff report when patient ambulated without oxygen her pulse ox dropped to 85%. I'm going to proceed with a CT angiogram of her chest to look for PE but this will also show she has any other interstitial lung problem causing her hypoxia. Her BNP is normal side doubt that she has congestive heart failure.  When I went back into the room nursing staff was helping patient go up in the stretcher to the head of her stretcher and her pulse ox dropped to 82%.  We discussed proceeding with the CT scan.   Pt turned over at change of shift to Dr Tyrone Nine to get her CT results and to arrange admission.   Final Clinical Impressions(s) / ED Diagnoses   Final diagnoses:  Palpitations  Exertional dyspnea   Plan admission  Rolland Porter, MD, Barbette Or, MD 09/03/16 [phone]

## 2016-09-03 NOTE — H&P (Signed)
Date: 09/03/2016               Patient Name:  Mary [name] MRN: [id number]  DOB: 07-Dec-1940 Age / Sex: 76 y.o., female   PCP: Dema Severin, NP         Medical Service: Internal Medicine Teaching Service         Attending Physician: Dr. Burns Spain, MD    First Contact: Dr. Alinda Money Pager: [phone]  Second Contact: Dr. Dimple Casey Pager: [phone]       After Hours (After 5p/  First Contact Pager: [phone]  weekends / holidays): Second Contact Pager: [phone]   Chief Complaint:  "my heart rate picked up"  History of Present Illness:  This is a 76 y.o. woman with PMHx of DM, seronegative RA, gout, OA, HTN, HLD, anxiety, osteoporosis, hypothyroidism who presents from home due waking up from sleep and feeling like her heart was racing.  Patient reports previous episodes of similar events associated with emotional stress and her anxiety.  These episodes typically resolve with taking her "nerve pill".  Last night was different in that the episode lasted longer and did not completely resolve with her nerve pill.  It did, however, resolve when she got up and moved around.  She reports feeling more anxious of late due to recent diagnosis of a E. Coli UTI by her PCP.    During evaluation in the ED, patient was noted to be hypoxic on room air with exertion to 85%.  Her heart rate appeared normal.  A CTA chest was ordered to evaluate for PE.  This study was negative for PE but did note extensive bilateral ground-glass opacities that are nonspecific and could be due to pulmonary edema in this patient with cardiomegaly, ARDS or hypersensitivity pneumonitis.  She is currently saturating at 93-95% on 2 liters via nasal cannula.    Upon further ROS, patient reports SOB with exertion that is not as bad with her walker.  Her daughter reports that her dyspnea with exertion has been increasing over the past several months and she feels that her mother has also been more sedentary.  Patient confirms this  and attributes this in some degree to her arthritis pain. She denies anginal symptoms.  At baseline, she ambulates with walker but uses a wheelchair if she needs to go further distances. She also reports stable 2 pillow orthopnea, dry cough for many years, and recent anxiety over her UTI.  She currently denies CP, PND, lower extremity edema.  Her dysuria and abdominal discomfort from her UTI has also resolved.  Denies fevers.  She was recently seen by her rheumatologist last month.  Patient was interested in switching from MTX to another DMARD.  She was in agreement tot switch to leflunomide.  She has not yet started this new medication with plans to do so after her UTI had resolved.  She has, however, been off her MTX.   Meds:  Current Meds  Medication Sig  . alendronate (FOSAMAX) 70 MG tablet Take 1 tablet (70 mg total) by mouth once a week. Take with a full glass of water on an empty stomach. (Patient taking differently: Take 70 mg by mouth 3 (three) times a week. Monday, Wednesday and  Friday  Take with a full glass of water on an empty stomach.)  . allopurinol (ZYLOPRIM) 100 MG tablet Take 100 mg by mouth 2 (two) times daily.   Marland Kitchen amLODipine-benazepril (LOTREL) 5-20 MG capsule Take 1 capsule by  mouth daily.   . carvedilol (COREG) 6.25 MG tablet Take 6.25 mg by mouth 2 (two) times daily with a meal.   . Cholecalciferol (VITAMIN D3) 5000 units TABS Take 1 tablet by mouth daily.   . clorazepate (TRANXENE) 7.5 MG tablet Take 7.5 mg by mouth 2 (two) times daily.   . folic acid (FOLVITE) 1 MG tablet Take 2 tablets (2 mg total) by mouth daily.  Marland Kitchen levothyroxine (SYNTHROID, LEVOTHROID) 75 MCG tablet Take 75 mcg by mouth daily.   . metFORMIN (GLUCOPHAGE) 500 MG tablet Take 500 mg by mouth 2 (two) times daily with a meal.   . sulfamethoxazole-trimethoprim (BACTRIM DS,SEPTRA DS) 800-160 MG tablet Take 1 tablet by mouth 2 (two) times daily.     Allergies: Allergies as of 09/03/2016 - Review Complete  09/03/2016  Allergen Reaction Noted  . Aspirin Other (See Comments) 04/19/2015  . Codeine Other (See Comments) 04/19/2015   Past Medical History:  Diagnosis Date  . Complication of anesthesia   . Diabetes mellitus without complication (HCC)   . Dyspnea   . Gout   . High cholesterol   . History of kidney stones   . Hypertension   . Hypoxia 08/2016  . Osteoarthritis   . PONV (postoperative nausea and vomiting)   . Rheumatoid arthritis (HCC)     Family History: Her brothers and sisters have anxiety and arthritis.  She has a brother who passed recently from pancreatic cancer.  There is no other family hx of heart disease or kidney disease  Social History: She is retired from [employer], married, 2 children who live nearby (son and daughter), denies EtOH, never smoker but was exposed to second hand smoke from husband.  Review of Systems: A complete ROS was negative except as per HPI.   Physical Exam: Blood pressure (!) 115/58, pulse 88, temperature 98.4 F (36.9 C), temperature source Oral, resp. rate 20, height 5\' 3"  (1.6 m), weight 160 lb (72.6 kg), SpO2 94 %. General: resting in bed, elderly woman, no distress HEENT: [place]/AT, PERRL, EOMI, no scleral icterus Cardiac: RRR, no rubs, murmurs or gallops Pulm: on 2 liters via [place], bibasilar fine crackles, no wheezes Abd: soft, nontender, nondistended, BS present Ext: No lower extremity edema other than indentation from her socks Neuro: alert and oriented X3, cranial nerves II-XII grossly intact  Psych: normal mood and affect MSK: marked deformity of her bilateral hands and feet c/w RA.  Limited ROM of wrist joints.  Swelling of her right knee more so than left.  EKG: personally reviewed my interpretation is HR 97, NSR, no prior comparison  CXR: personally reviewed my interpretation is vascular congestion and mild cardiomegaly.  Possible pulmonary edema given hazy interstitial prominence.  CTA Chest IMPRESSION: Negative for  pulmonary embolus.  Extensive bilateral ground-glass opacities are nonspecific and could be due to pulmonary edema in this patient with cardiomegaly, ARDS or hypersensitivity pneumonitis among other entities.  Bilateral hilar and mediastinal lymph nodes as described above are likely reactive.  Calcific aortic and coronary atherosclerosis.  Hiatal hernia.  Labs: Na 137, K 5.3, Cl 109, CO2 19, BUN 23, Cr 0.96, Gluc 135, Anion Gap 9 Magnesium 1.7 AST 19, ALT 10, AP 63, T. Bili 0.4 BNP 14 WBC 7.5, Hgb 11.4, HCT 35.9, Plt 412, MCV 91 UA negative for Hgb, bilirubin, ketones, protein, nitrites, or leukocytes I-Stat troponin 0.00  Assessment & Plan by Problem:  Hypoxemia Dyspnea on Exertion Abnormalities on CTA Chest Unclear etiology of her presenting hypoxemia, but  symptoms sound to have been progressive over several months with her daughter reporting worsening exertional capacity.  She was evaluated already by physical therapy who noted oxygen saturations in the upper 80's.  While CTA chest mentions that her bilateral ground glass opacities could be pulmonary edema or ARDS, this does not fit well with the rest of her exam.  She has no history of heart failure, no significant lower extremity edema, and her BNP was normal.  Her PaO2/FiO2 ratio estimated based on her current SpO2 would be about 260, consistent with mild ARDS.  However, given ARDS can be only be diagnosed once cardiogenic pulmonary edema and alternative causes of acute hypoxemic respiratory failure and bilateral infiltrates have been excluded, I think it is likely too early to make this conclusion.  On the differrential otherwise includes methotrexate-induced lung injury, infection (atypical vs typical), her underlying RA disease, or something such as cryptogenic organizing pneumonia.  I think she would benefit from pulmonary consult to consider bronchoscopy with BAL and further consideration of glucocorticoids. - Pulmonology  consultation, appreciate recommendations - Check a 2D Echo - Duonebs - Albuterol PRN - Ambulate with pulse ox - PT/OT evaluation - Hold off on diuresis for now, could consider PICC line to better measure CVP and volume status  Hyperkalemia: mild, 5.3 on admission. - repeat BMET in the morning.  Urinary Tract Infection: has completed 5 days of Bactrim DS - discontinue Bactrim as she has completed 5 days for seemingly uncomplicated cystitis  RA: previously on MTX.  Recently discontinued by her rheumatologist and plans to start on leflunomide - follow up with outpatient rheumatology - continue folic acid  DM: on metformin 500mg  BID - check A1c - Sliding scale with HS coverage  HTN: on amlodipine-benazepril 5-20mg  daily and Coreg 6.25mg  BID - BP low-normal here  - hold CCB/ACE combination - continue beta blocker  HLD: on atorvastatin 10mg  daily - Continue home med  Anxiety: on clorazepate 7.5mg  BID as needed - continue home med  Hypothyroidism: on Synthroid 75mcg daily - continue home medication  Gout: on allopurinol 100mg  BID  Osteoporosis: on vitamin D and alendronate  Dispo: Admit patient to Observation with expected length of stay less than 2 midnights.  SignedGwynn Burly: Shavaughn Seidl, DO 09/03/2016, 4:38 PM  Pager: [phone]

## 2016-09-03 NOTE — Evaluation (Signed)
Physical Therapy Evaluation Patient Details Name: Mary [name] MRN: [id number] DOB: [birth year] Today's Date: 09/03/2016   History of Present Illness  Pt is a 76 y/o female admitted secondary to hypoxia. CT angio of the chest negative for PE. PMH includes RA, gout, DM, and HTN.   Clinical Impression  Pt admitted secondary to problem above with deficits below. PTA, pt reports she needed assist with mobility and was using rollator. Also reports she needed assist with ADLs. Upon evaluation, pt requiring from min guard to mod A for mobility secondary to decreased balance and strength. Pt also with decreased oxygen during ambulation; see ambulation section and oxygen qualification note. Pt and family wanting to be d/c'd home. Will need HHPT and RW at d/c. Will continue to follow acutely to maximize functional mobility independence.     Follow Up Recommendations Home health PT;Supervision/Assistance - 24 hour    Equipment Recommendations  Rolling walker with 5" wheels    Recommendations for Other Services       Precautions / Restrictions Precautions Precautions: Fall Restrictions Weight Bearing Restrictions: No      Mobility  Bed Mobility Overal bed mobility: Needs Assistance Bed Mobility: Supine to Sit     Supine to sit: Mod assist     General bed mobility comments: Mod A for trunk elevation and scooting hips to EOB. Attempted without oxygen, however, sats decreasing to 85% in sitting on RA. 2L applied and sats returned to 91%.  Transfers Overall transfer level: Needs assistance Equipment used: Rolling walker (2 wheeled) Transfers: Sit to/from Stand Sit to Stand: Mod assist         General transfer comment: Mod A for lift assist and steadying upon standing. Pt's husband reports she usually needs assist to stand, however, pt reports it is a little more than normal.   Ambulation/Gait Ambulation/Gait assistance: Min guard Ambulation Distance (Feet): 20 Feet Assistive  device: Rolling walker (2 wheeled) Gait Pattern/deviations: Step-through pattern;Decreased stride length;Shuffle;Trunk flexed Gait velocity: Decreased Gait velocity interpretation: Below normal speed for age/gender General Gait Details: Slow, unsteady gait with RW. Min guard for safety. Verbal cues for proximity to device and upright posture. Oxygen sats decreasing to 86% on 2L, however, with seated rest and cues for pursed lip breathing, returned to 92%. Further gait training from bed to chair, pt requiring 4L and oxygen sats decreasing to 88%. Elevated back to 92% with seated rest and cues for pursed lip breathing. Decreased oxygen back to 2L after gait training and sats maintained at 91%.  Stairs            Wheelchair Mobility    Modified Rankin (Stroke Patients Only)       Balance Overall balance assessment: Needs assistance Sitting-balance support: No upper extremity supported;Feet supported Sitting balance-Leahy Scale: Fair     Standing balance support: Bilateral upper extremity supported;During functional activity Standing balance-Leahy Scale: Poor Standing balance comment: Reliant on BUE for support                              Pertinent Vitals/Pain Pain Assessment: No/denies pain    Home Living Family/patient expects to be discharged to:: Private residence Living Arrangements: Spouse/significant other Available Help at Discharge: Family;Available 24 hours/day Type of Home: House Home Access: Ramped entrance     Home Layout: One level Home Equipment: Shower seat - built in;Walker - 4 wheels;Cane - single point      Prior Function Level of Independence:  Needs assistance   Gait / Transfers Assistance Needed: Needed supervision with mobility and use of Rpollator   ADL's / Homemaking Assistance Needed: Needed assist with bathing and dressing and getting into and out of the shower.         Hand Dominance   Dominant Hand: Right     Extremity/Trunk Assessment   Upper Extremity Assessment Upper Extremity Assessment: Defer to OT evaluation    Lower Extremity Assessment Lower Extremity Assessment: Generalized weakness    Cervical / Trunk Assessment Cervical / Trunk Assessment: Kyphotic  Communication   Communication: No difficulties  Cognition Arousal/Alertness: Awake/alert Behavior During Therapy: WFL for tasks assessed/performed Overall Cognitive Status: Within Functional Limits for tasks assessed                                        General Comments General comments (skin integrity, edema, etc.): Pt's daughter and husband present during session. REports they want to go home at d/c. Educated about HHPT to aid in increasing independence and pt agreeable. Educated about use of RW to increase stability and pt agreeable. Educated about BUE exercise to assist with increasing oxygen capacity.     Exercises     Assessment/Plan    PT Assessment Patient needs continued PT services  PT Problem List Decreased strength;Decreased activity tolerance;Decreased balance;Decreased mobility;Cardiopulmonary status limiting activity;Decreased knowledge of use of DME       PT Treatment Interventions DME instruction;Gait training;Functional mobility training;Therapeutic activities;Therapeutic exercise;Balance training;Neuromuscular re-education;Patient/family education    PT Goals (Current goals can be found in the Care Plan section)  Acute Rehab PT Goals Patient Stated Goal: to get better PT Goal Formulation: With patient Time For Goal Achievement: 09/17/16 Potential to Achieve Goals: Fair    Frequency Min 3X/week   Barriers to discharge        Co-evaluation               AM-PAC PT "6 Clicks" Daily Activity  Outcome Measure Difficulty turning over in bed (including adjusting bedclothes, sheets and blankets)?: Total Difficulty moving from lying on back to sitting on the side of the bed? :  Total Difficulty sitting down on and standing up from a chair with arms (e.g., wheelchair, bedside commode, etc,.)?: Total Help needed moving to and from a bed to chair (including a wheelchair)?: A Little Help needed walking in hospital room?: A Little Help needed climbing 3-5 steps with a railing? : A Lot 6 Click Score: 11    End of Session Equipment Utilized During Treatment: Gait belt;Oxygen Activity Tolerance: Treatment limited secondary to medical complications (Comment) (decreased oxygen sats) Patient left: in chair;with call bell/phone within reach;with nursing/sitter in room;with family/visitor present Nurse Communication: Mobility status PT Visit Diagnosis: Other abnormalities of gait and mobility (R26.89);Unsteadiness on feet (R26.81);Muscle weakness (generalized) (M62.81)    Time: [id number] PT Time Calculation (min) (ACUTE ONLY): 29 min   Charges:   PT Evaluation $PT Eval Moderate Complexity: 1 Procedure PT Treatments $Gait Training: 8-22 mins   PT G Codes:   PT G-Codes **NOT FOR INPATIENT CLASS** Functional Assessment Tool Used: AM-PAC 6 Clicks Basic Mobility;Clinical judgement Functional Limitation: Mobility: Walking and moving around Mobility: Walking and Moving Around Current Status ([id number]([id number]): At least 60 percent but less than 80 percent impaired, limited or restricted Mobility: Walking and Moving Around Goal Status [phone](G8979): At least 20 percent but less than 40 percent impaired, limited or  restricted    Gladys Damme, PT, DPT  Acute Rehabilitation Services  Pager: [phone]   Lehman Prom 09/03/2016, 2:36 PM

## 2016-09-03 NOTE — ED Notes (Signed)
Ambulated to bathroom without oxygen.  Immediately dropped down to mid 80's.

## 2016-09-03 NOTE — Progress Notes (Addendum)
SATURATION QUALIFICATIONS: (This note is used to comply with regulatory documentation for home oxygen)  Patient Saturations on Room Air at Rest = 85%  Patient Saturations on Room Air while Ambulating = Did not attempt secondary to low oxygen in sitting  Patient Saturations on 4 Liters of oxygen while Ambulating = 88%  Please briefly explain why patient needs home oxygen: Unable to maintain sats above 90% on RA in sitting. Pt required 4L to maintain oxygen levels at 88% during ambulation. Able to increase back to 92% on 4L with pursed lip breathing and seated rest. Decreased oxygen back to 2L after session and sats maintained at 91%.  Gladys DammeBrittany Simora Dingee, PT, DPT  Acute Rehabilitation Services  Pager: [phone]

## 2016-09-03 NOTE — Consult Note (Signed)
Name: Mary [name] MRN: [id number] DOB: [birth year]    ADMISSION DATE:  09/03/2016 CONSULTATION DATE:  09/03/2016  REFERRING MD :  Dr. Lynnae January  CHIEF COMPLAINT:  Palpitations  HISTORY OF PRESENT ILLNESS:  76 year old female with PMH as below, which is significant for DM, seronegative RA, gout, HTN, HLD, and hypothyroidism. Recently diagnosed with UTI as well. She has been treated for RA with leflunomide for quite some time and was in the process of being switched to leflunomide for unclear reasons. She has, however, not taken her first dose of this yet. She presented to [facility] ED with complaints of palpitations and anxiety, which did not resolve with a "nerve pill" as they typically do. Upon arrival to the ED she was found to be hypoxemic with O2 sats in the 80s. She was given supplemental oxygen and sats improved to the low 90s. She went for a CTA of the chest, which was negative for PE, but did identify bilateral GGO. She was admitted to the Internal Medicine Teaching Service and PCCM was asked to see in consultation.   Upon my evaluation patient reports that her SOB has gradually progressed over a period of weeks. Report chronic sinus congestion and GERD that go back some time, but the dyspnea is new and worse on exertion. She denies associated cough, mucous production, nausea, vomiting. She denies smoking ever, drinking, illegal drug use. She did work in a [employer] for several years about 12-15 years ago. She has not been around birds other than that. No other unusual animal exposures.   SIGNIFICANT EVENTS  7/5 admit with hypoxia.  STUDIES:  CTA chest 7/5 > Negative for pulmonary embolus. Extensive bilateral ground-glass opacities are nonspecific and could be due to pulmonary edema in this patient with cardiomegaly, ARDS or hypersensitivity pneumonitis among other entities. Bilateral hilar and mediastinal lymph nodes as described above are likely reactive. Calcific aortic and coronary  atherosclerosis. Hiatal hernia.   PAST MEDICAL HISTORY :   has a past medical history of Complication of anesthesia; Diabetes mellitus without complication ([place]); Dyspnea; Gout; High cholesterol; History of kidney stones; Hypertension; Hypoxia (08/2016); Osteoarthritis; PONV (postoperative nausea and vomiting); and Rheumatoid arthritis ([place]).  has a past surgical history that includes Abdominal hysterectomy. Prior to Admission medications   Medication Sig Start Date End Date Taking? Authorizing Provider  alendronate (FOSAMAX) 70 MG tablet Take 1 tablet (70 mg total) by mouth once a week. Take with a full glass of water on an empty stomach. Patient taking differently: Take 70 mg by mouth 3 (three) times a week. Monday, Wednesday and  Friday  Take with a full glass of water on an empty stomach. 06/22/16  Yes Deveshwar, Abel Presto, MD  allopurinol (ZYLOPRIM) 100 MG tablet Take 100 mg by mouth 2 (two) times daily.  08/05/15  Yes [provider]  amLODipine-benazepril (LOTREL) 5-20 MG capsule Take 1 capsule by mouth daily.    Yes [provider]  carvedilol (COREG) 6.25 MG tablet Take 6.25 mg by mouth 2 (two) times daily with a meal.    Yes [provider]  Cholecalciferol (VITAMIN D3) 5000 units TABS Take 1 tablet by mouth daily.    Yes [provider]  clorazepate (TRANXENE) 7.5 MG tablet Take 7.5 mg by mouth 2 (two) times daily.    Yes [provider]  folic acid (FOLVITE) 1 MG tablet Take 2 tablets (2 mg total) by mouth daily. 03/04/16 05/28/17 Yes Panwala, Naitik, PA-C  levothyroxine (SYNTHROID, LEVOTHROID)  75 MCG tablet Take 75 mcg by mouth daily.  10/24/15  Yes [provider]  metFORMIN (GLUCOPHAGE) 500 MG tablet Take 500 mg by mouth 2 (two) times daily with a meal.    Yes [provider]  sulfamethoxazole-trimethoprim (BACTRIM DS,SEPTRA DS) 800-160 MG tablet Take 1 tablet by mouth 2 (two) times daily.   Yes [provider]    atorvastatin (LIPITOR) 10 MG tablet Take 10 mg by mouth daily. 06/30/16   [provider]   Allergies  Allergen Reactions  . Aspirin Other (See Comments)    Ask patient  . Codeine Other (See Comments)    Ask patient    FAMILY HISTORY:  family history is not on file. SOCIAL HISTORY:  reports that she has never smoked. She has never used smokeless tobacco. She reports that she does not drink alcohol or use drugs.  REVIEW OF SYSTEMS:   Review of Systems:   Bolds are positive  Constitutional: weight loss, gain, night sweats, Fevers, chills, fatigue .  HEENT: headaches, Sore throat, sneezing, nasal congestion, post nasal drip, Difficulty swallowing, Tooth/dental problems, visual complaints visual changes, ear ache CV:  chest pain, radiates,Orthopnea, PND, swelling in lower extremities, dizziness, palpitations, syncope.  GI  heartburn, indigestion, abdominal pain, nausea, vomiting, diarrhea, change in bowel habits, loss of appetite, bloody stools.  Resp: cough, productive, hemoptysis, dyspnea, chest pain, pleuritic.  Skin: rash or itching or icterus GU: dysuria, change in color of urine, urgency or frequency. flank pain, hematuria  MS: joint pain or swelling. decreased range of motion  Psych: change in mood or affect. depression or anxiety.  Neuro: difficulty with speech, weakness, numbness, ataxia   SUBJECTIVE:   VITAL SIGNS: Temp:  [98.4 F (36.9 C)-98.9 F (37.2 C)] 98.4 F (36.9 C) (07/05 1500) Pulse Rate:  [85-97] 88 (07/05 1500) Resp:  [17-29] 20 (07/05 1500) BP: (98-137)/(58-79) 115/58 (07/05 1500) SpO2:  [89 %-97 %] 94 % (07/05 1500) FiO2 (%):  [28 %] 28 % (07/05 1333) Weight:  [72.6 kg (160 lb)] 72.6 kg (160 lb) (07/05 0317)  PHYSICAL EXAMINATION: General:  Elderly female in NAD resting comfortably in bed Neuro:  Alert, oriented, non-focal HEENT:  Lazy Y U/AT, PERRL, no JVD Cardiovascular:  RRR, no MRG Lungs:  Coarse rales throughout. R base most prominent.   Abdomen:  Soft, non-tender, non-distended Musculoskeletal:  Bilateral ulnar drift Skin:  Grossly intact   Recent Labs Lab 09/03/16 0509  NA 137  K 5.3*  CL 109  CO2 19*  BUN 23*  CREATININE 0.96  GLUCOSE 135*    Recent Labs Lab 09/03/16 0509  HGB 11.4*  HCT 35.9*  WBC 7.5  PLT 412*   Dg Chest 2 View  Result Date: 09/03/2016 CLINICAL DATA:  Acute onset of shortness of breath and decreased O2 saturation. Tachycardia. Initial encounter. EXAM: CHEST  2 VIEW COMPARISON:  None. FINDINGS: The lungs are well-aerated. Vascular congestion is noted. Hazy interstitial prominence raises concern for pulmonary edema, more prominent on the left. There is no evidence of pleural effusion or pneumothorax. The heart is borderline enlarged. No acute osseous abnormalities are seen. IMPRESSION: Vascular congestion and borderline cardiomegaly. Hazy interstitial prominence raises concern for pulmonary edema, more prominent on the left. Electronically Signed   By: Garald Balding M.D.   On: 09/03/2016 05:51   Ct Angio Chest Pe W/cm &/or Wo Cm  Result Date: 09/03/2016 CLINICAL DATA:  Recent onset of shortness of breath and exertional hypoxia. EXAM: CT ANGIOGRAPHY CHEST WITH CONTRAST TECHNIQUE: Multidetector  CT imaging of the chest was performed using the standard protocol during bolus administration of intravenous contrast. Multiplanar CT image reconstructions and MIPs were obtained to evaluate the vascular anatomy. CONTRAST:  55 cc Isovue 370. COMPARISON:  Single-view of the chest this same day. FINDINGS: Cardiovascular: No pulmonary embolus is identified. Calcific aortic and coronary atherosclerosis is seen. There is cardiomegaly. No pericardial effusion. Mediastinum/Nodes: The patient has a small to moderate hiatal hernia. Thyroid gland appears normal. Right hilar lymph node on image 53 measures 2 cm short axis dimension. Left hilar lymph node on image 53 measures 1.1 cm short axis dimension. Subcarinal lymph  node on image 61 measures 1.3 cm short axis dimension. Lungs/Pleura: No pleural effusion. Extensive bilateral ground-glass opacities are seen. No nodule or mass. Upper Abdomen: Negative. Musculoskeletal: No lytic or sclerotic lesion. Review of the MIP images confirms the above findings. IMPRESSION: Negative for pulmonary embolus. Extensive bilateral ground-glass opacities are nonspecific and could be due to pulmonary edema in this patient with cardiomegaly, ARDS or hypersensitivity pneumonitis among other entities. Bilateral hilar and mediastinal lymph nodes as described above are likely reactive. Calcific aortic and coronary atherosclerosis. Hiatal hernia. Electronically Signed   By: Inge Rise M.D.   On: 09/03/2016 07:51    ASSESSMENT / PLAN:  Acute hypoxemic respiratory failure with diffuse bilateral pulmonary infiltrates: doubt infectious etiology. Radiographically represents an autoimmune or hypersensitivity reaction. Could represent pulmonary edema as well, however, she is not clinically volume overloaded. - Supplemental O2 increased to 4L at patient's requires for more flow - Keep SpO2 > 92% - Will start high moderate dose IV steroids to gauge response - Serum autoimmune panel ESR CRP RF ANA - Echo pending - May need bronchoscopic evaluation or lung biopsy down the road.   Georgann Housekeeper, AGACNP-BC [facility] Pager [phone] or [phone]  09/03/2016 4:51 PM  Attending Note:  76 year old female with severe rheumatoid arthritis being given weekly methotrexate injection for years.  Taken off 4 wks ago.  Now presenting to PCCM with hypoxemia, LAN, pulmonary infiltrate and pulmonary edema.  On exam, lungs with bibasilar crackles and severe deformities of both hands.  I reviewed CT myself, infiltrate and LAN noted.  Discussed with resident and PCCM-NP.  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  - Monitor clinically  - If no resolution may consider EBUS but patient is high risk.  Pulmonary infiltrate: unlikely infection  - Monitor WBC and fever curve  - Solumedrol 125 mg q12 hours  - F/U imaging in 72 hours  Pulmonary edema:  - Diureses  - 2D echo to be done in AM  PCCM will follow.  Patient seen and examined, agree with above note.  I dictated the care and orders written for this patient under my direction.  Rush Farmer, [place]

## 2016-09-03 NOTE — ED Triage Notes (Signed)
Reports heart feeling like its racing for the last week after being diagnosed with e-coli.  Also noted to be SOB at rest with O2 sat 88% on room air.  Placed on 2L in triage with sat now at 93%.  Denies having any pain.

## 2016-09-03 NOTE — Care Management Note (Signed)
Case Management Note  Patient Details  Name: Mary [name] MRN: [id number] Date of Birth: 16-Mar-1940  Subjective/Objective:               Presents with hypoxia from home with family.     Algernon HuxleyShannon Whitaker (Daughter)     [phone]       PCP:  Mauricio Poegina [place]  Action/Plan: CT chest angio negative for PE...... 2 D ECHO pending...Marland Kitchen.CM following for disposition needs.    Expected Discharge Date:                  Expected Discharge Plan:  Home/Self Care  In-House Referral:     Discharge planning Services  CM Consult  Post Acute Care Choice:    Choice offered to:     DME Arranged:    DME Agency:     HH Arranged:    HH Agency:     Status of Service:  In process, will continue to follow  If discussed at Long Length of Stay Meetings, dates discussed:    Additional Comments:  Epifanio LeschesCole, Daren Doswell Hudson, RN 09/03/2016, 10:27 AM

## 2016-09-03 NOTE — Progress Notes (Signed)
Patient trasfered from ED to 5W01 via stretcher, alert and oriented x 4; no complaints of pain; IV saline locked in RFA. Orient patient to room and unit; gave patient care guide; instructed how to use the call bell and  fall risk precautions. Will continue to monitor the patient.

## 2016-09-03 NOTE — ED Notes (Signed)
Patient transported to CT 

## 2016-09-03 NOTE — Care Management Obs Status (Signed)
MEDICARE OBSERVATION STATUS NOTIFICATION   Patient Details  Name: Mary [name] MRN: [id number] Date of Birth: November 01, 1940   Medicare Observation Status Notification Given:  Yes    Epifanio LeschesCole, Rustin Erhart Hudson, RN 09/03/2016, 3:58 PM

## 2016-09-04 ENCOUNTER — Observation Stay (HOSPITAL_BASED_OUTPATIENT_CLINIC_OR_DEPARTMENT_OTHER): Payer: Medicare HMO

## 2016-09-04 DIAGNOSIS — E119 Type 2 diabetes mellitus without complications: Secondary | ICD-10-CM

## 2016-09-04 DIAGNOSIS — M0510 Rheumatoid lung disease with rheumatoid arthritis of unspecified site: Secondary | ICD-10-CM | POA: Diagnosis present

## 2016-09-04 DIAGNOSIS — Z7983 Long term (current) use of bisphosphonates: Secondary | ICD-10-CM | POA: Diagnosis not present

## 2016-09-04 DIAGNOSIS — I2510 Atherosclerotic heart disease of native coronary artery without angina pectoris: Secondary | ICD-10-CM | POA: Diagnosis present

## 2016-09-04 DIAGNOSIS — E872 Acidosis: Secondary | ICD-10-CM | POA: Diagnosis present

## 2016-09-04 DIAGNOSIS — K449 Diaphragmatic hernia without obstruction or gangrene: Secondary | ICD-10-CM | POA: Diagnosis present

## 2016-09-04 DIAGNOSIS — M0519 Rheumatoid lung disease with rheumatoid arthritis of multiple sites: Secondary | ICD-10-CM | POA: Diagnosis not present

## 2016-09-04 DIAGNOSIS — M109 Gout, unspecified: Secondary | ICD-10-CM

## 2016-09-04 DIAGNOSIS — R0600 Dyspnea, unspecified: Secondary | ICD-10-CM

## 2016-09-04 DIAGNOSIS — M810 Age-related osteoporosis without current pathological fracture: Secondary | ICD-10-CM

## 2016-09-04 DIAGNOSIS — E785 Hyperlipidemia, unspecified: Secondary | ICD-10-CM | POA: Diagnosis present

## 2016-09-04 DIAGNOSIS — Z79899 Other long term (current) drug therapy: Secondary | ICD-10-CM

## 2016-09-04 DIAGNOSIS — Z7984 Long term (current) use of oral hypoglycemic drugs: Secondary | ICD-10-CM

## 2016-09-04 DIAGNOSIS — I10 Essential (primary) hypertension: Secondary | ICD-10-CM | POA: Diagnosis present

## 2016-09-04 DIAGNOSIS — E039 Hypothyroidism, unspecified: Secondary | ICD-10-CM | POA: Diagnosis present

## 2016-09-04 DIAGNOSIS — N390 Urinary tract infection, site not specified: Secondary | ICD-10-CM | POA: Diagnosis not present

## 2016-09-04 DIAGNOSIS — Z8261 Family history of arthritis: Secondary | ICD-10-CM | POA: Diagnosis not present

## 2016-09-04 DIAGNOSIS — F419 Anxiety disorder, unspecified: Secondary | ICD-10-CM

## 2016-09-04 DIAGNOSIS — Z90710 Acquired absence of both cervix and uterus: Secondary | ICD-10-CM | POA: Diagnosis not present

## 2016-09-04 DIAGNOSIS — R002 Palpitations: Secondary | ICD-10-CM | POA: Diagnosis present

## 2016-09-04 DIAGNOSIS — R0902 Hypoxemia: Secondary | ICD-10-CM | POA: Diagnosis not present

## 2016-09-04 DIAGNOSIS — Z885 Allergy status to narcotic agent status: Secondary | ICD-10-CM | POA: Diagnosis not present

## 2016-09-04 DIAGNOSIS — K219 Gastro-esophageal reflux disease without esophagitis: Secondary | ICD-10-CM | POA: Diagnosis present

## 2016-09-04 DIAGNOSIS — Z800 Family history of malignant neoplasm of digestive organs: Secondary | ICD-10-CM | POA: Diagnosis not present

## 2016-09-04 DIAGNOSIS — E875 Hyperkalemia: Secondary | ICD-10-CM | POA: Diagnosis present

## 2016-09-04 DIAGNOSIS — M0609 Rheumatoid arthritis without rheumatoid factor, multiple sites: Secondary | ICD-10-CM | POA: Diagnosis not present

## 2016-09-04 DIAGNOSIS — Z818 Family history of other mental and behavioral disorders: Secondary | ICD-10-CM | POA: Diagnosis not present

## 2016-09-04 DIAGNOSIS — I4891 Unspecified atrial fibrillation: Secondary | ICD-10-CM | POA: Diagnosis present

## 2016-09-04 DIAGNOSIS — E7800 Pure hypercholesterolemia, unspecified: Secondary | ICD-10-CM | POA: Diagnosis present

## 2016-09-04 DIAGNOSIS — Z886 Allergy status to analgesic agent status: Secondary | ICD-10-CM | POA: Diagnosis not present

## 2016-09-04 DIAGNOSIS — R918 Other nonspecific abnormal finding of lung field: Secondary | ICD-10-CM | POA: Diagnosis not present

## 2016-09-04 DIAGNOSIS — M1A09X0 Idiopathic chronic gout, multiple sites, without tophus (tophi): Secondary | ICD-10-CM | POA: Diagnosis not present

## 2016-09-04 DIAGNOSIS — J9601 Acute respiratory failure with hypoxia: Secondary | ICD-10-CM | POA: Diagnosis present

## 2016-09-04 LAB — ECHOCARDIOGRAM COMPLETE
FS: 40 % (ref 28–44)
Height: 63 in
IVS/LV PW RATIO, ED: 0.64
LA diam end sys: 35 mm
LA diam index: 2.06 cm/m2
LA vol index: 21.5 mL/m2
LASIZE: 35 mm
LAVOL: 36.5 mL
LAVOLA4C: 36.6 mL
LDCA: 3.14 cm2
LVOT SV: 49 mL
LVOT VTI: 15.6 cm
LVOT peak grad rest: 4 mmHg
LVOT peak vel: 97 cm/s
LVOTD: 20 mm
Lateral S' vel: 15.1 cm/s
PW: 14 mm — AB (ref 0.6–1.1)
TAPSE: 17.4 mm
Weight: 2363.2 oz

## 2016-09-04 LAB — RESPIRATORY PANEL BY PCR
Adenovirus: NOT DETECTED
BORDETELLA PERTUSSIS-RVPCR: NOT DETECTED
CHLAMYDOPHILA PNEUMONIAE-RVPPCR: NOT DETECTED
Coronavirus 229E: NOT DETECTED
Coronavirus HKU1: NOT DETECTED
Coronavirus NL63: NOT DETECTED
Coronavirus OC43: NOT DETECTED
INFLUENZA B-RVPPCR: NOT DETECTED
Influenza A: NOT DETECTED
METAPNEUMOVIRUS-RVPPCR: NOT DETECTED
Mycoplasma pneumoniae: NOT DETECTED
PARAINFLUENZA VIRUS 2-RVPPCR: NOT DETECTED
PARAINFLUENZA VIRUS 3-RVPPCR: NOT DETECTED
Parainfluenza Virus 1: NOT DETECTED
Parainfluenza Virus 4: NOT DETECTED
RESPIRATORY SYNCYTIAL VIRUS-RVPPCR: NOT DETECTED
Rhinovirus / Enterovirus: NOT DETECTED

## 2016-09-04 LAB — PROCALCITONIN

## 2016-09-04 LAB — BASIC METABOLIC PANEL
Anion gap: 8 (ref 5–15)
BUN: 21 mg/dL — AB (ref 6–20)
CALCIUM: 9.6 mg/dL (ref 8.9–10.3)
CO2: 17 mmol/L — ABNORMAL LOW (ref 22–32)
Chloride: 108 mmol/L (ref 101–111)
Creatinine, Ser: 0.75 mg/dL (ref 0.44–1.00)
GFR calc Af Amer: >60 mL/min (ref >60)
Glucose, Bld: 248 mg/dL — ABNORMAL HIGH (ref 65–99)
POTASSIUM: 5.4 mmol/L — AB (ref 3.5–5.1)
SODIUM: 133 mmol/L — AB (ref 135–145)

## 2016-09-04 LAB — CBC
HEMATOCRIT: 35.4 % — AB (ref 36.0–46.0)
Hemoglobin: 10.8 g/dL — ABNORMAL LOW (ref 12.0–15.0)
MCH: 28.3 pg (ref 26.0–34.0)
MCHC: 30.5 g/dL (ref 30.0–36.0)
MCV: 92.7 fL (ref 78.0–100.0)
PLATELETS: 410 10*3/uL — AB (ref 150–400)
RBC: 3.82 MIL/uL — ABNORMAL LOW (ref 3.87–5.11)
RDW: 17.9 % — AB (ref 11.5–15.5)
WBC: 4 10*3/uL (ref 4.0–10.5)

## 2016-09-04 LAB — GLUCOSE, CAPILLARY
GLUCOSE-CAPILLARY: 226 mg/dL — AB (ref 65–99)
GLUCOSE-CAPILLARY: 304 mg/dL — AB (ref 65–99)
GLUCOSE-CAPILLARY: 150 mg/dL — AB (ref 65–99)
Glucose-Capillary: 276 mg/dL — ABNORMAL HIGH (ref 65–99)

## 2016-09-04 LAB — HEMOGLOBIN A1C
Hgb A1c MFr Bld: 7.2 % — ABNORMAL HIGH (ref 4.8–5.6)
MEAN PLASMA GLUCOSE: 160 mg/dL

## 2016-09-04 MED ORDER — IPRATROPIUM-ALBUTEROL 0.5-2.5 (3) MG/3ML IN SOLN
3.0000 mL | Freq: Three times a day (TID) | RESPIRATORY_TRACT | Status: DC
  Administered 2016-09-04 (×3): 3 mL via RESPIRATORY_TRACT
  Filled 2016-09-04 (×3): qty 3

## 2016-09-04 NOTE — Progress Notes (Signed)
Patient ID: Mary MeierGaynelle [name], female   DOB: [birth year], 76 y.o.   MRN: [id number]   Subjective: [name] was looking improved today. She was in good spirits with multiple family memebers present. She states states her breathing is improved today.  Objective:  Vital signs in last 24 hours: Vitals:   09/04/16 0643 09/04/16 0818 09/04/16 1345 09/04/16 1405  BP:    110/63  Pulse:    99  Resp:    18  Temp:    98 F (36.7 C)  TempSrc:    Oral  SpO2:  94% 98% 97%  Weight: 147 lb 11.2 oz (67 kg)     Height:       Physical Exam  Constitutional: She is oriented to person, place, and time. She appears well-developed and well-nourished.  HENT:  Head: Normocephalic and atraumatic.  Eyes: EOM are normal. No scleral icterus.  Cardiovascular: Normal rate, regular rhythm and normal heart sounds.   Pulmonary/Chest: Effort normal. No respiratory distress. She has rales.  Abdominal: Soft. Bowel sounds are normal. She exhibits no distension. There is no tenderness.  Musculoskeletal: She exhibits no tenderness or deformity.  Neurological: She is alert and oriented to person, place, and time.  Skin: Skin is warm and dry.    Assessment/Plan:  Hypoxemia Dyspnea on Exertion Abnormalities on CTA Chest Unclear etiology of her presenting hypoxemia, but symptoms sound to have been progressive over several months with her daughter reporting worsening exertional capacity. CTA chest mentions that her bilateral ground glass opacities could be pulmonary edema or ARDS, this does not fit well with the rest of her exam. She has no history of heart failure, no significant lower extremity edema, and her BNP was normal. Her PaO2/FiO2 ratio estimated based on her current SpO2 would be about 260, consistent with mild ARDS.  However, given ARDS can be only be diagnosed once cardiogenic pulmonary edema and alternative causes of acute hypoxemic respiratory failure and bilateral infiltrates have been excluded, I think it is  likely too early to make this conclusion. On the differrential otherwise includes methotrexate-induced lung injury, infection (atypical vs typical), her underlying RA disease, or something such as cryptogenic organizing pneumonia.  I think she would benefit from pulmonary consult to consider bronchoscopy with BAL and further consideration of glucocorticoids. - CT reviewed with radiology in the context of clinical condition  - Imaging findings believed to be most likely due to her RA - Pulmonology consultation, appreciate recommendations - Responding to steroids, continue and montior - 2D Echo: Mild LVH, EF: 60-65% - Duonebs - Albuterol PRN - Ambulate with pulse ox - PT/OT evaluation  Hyperkalemia: mild, 5.3 on admission. - 5.4 on (7/6) - Recheck in AM  Urinary Tract Infection: has completed 5 days of Bactrim DS - discontinue Bactrim as she has completed 5 days for seemingly uncomplicated cystitis  RA: previously on MTX.  Recently discontinued by her rheumatologist and plans to start on leflunomide - follow up with outpatient rheumatology - continue folic acid  DM: on metformin 500mg  BID - check A1c - Sliding scale with HS coverage - Monitor Blood Glucose on Steroids  HTN: on amlodipine-benazepril 5-20mg  daily and Coreg 6.25mg  BID - BP remains low-normal here  - hold CCB/ACE combination - continue beta blocker  HLD: on atorvastatin 10mg  daily - Continue home med  Anxiety: on clorazepate 7.5mg  BID as needed - continue home med  Hypothyroidism: on Synthroid 75mcg daily - continue home medication  Gout: on allopurinol 100mg  BID  Osteoporosis: on vitamin  D and alendronate  DVT PPx: Lovenox 40  F/E/N: Heart Diet  Dispo: Anticipated discharge in approximately 2 - 3 day(s).   Beola Cord, MD 09/04/2016, 4:05 PM Pager: [phone]

## 2016-09-04 NOTE — Evaluation (Signed)
Occupational Therapy Evaluation Patient Details Name: Mary [name] MRN: [id number] DOB: [birth year] Today's Date: 09/04/2016    History of Present Illness Pt is a 76 y/o female admitted secondary to hypoxia. CT angio of the chest negative for PE. PMH includes RA, gout, DM, and HTN.    Clinical Impression   Pt was admitted for the above. She needs intermittent assistance at home but does not use 02. Seen today on 4 liters. She will benefit from continued OT to increase safety and independence with adls.  She currently needs up to max A for adls. Goals are for min guard to min A    Follow Up Recommendations  No OT follow up;Supervision/Assistance - 24 hour    Equipment Recommendations  None recommended by OT    Recommendations for Other Services       Precautions / Restrictions Precautions Precautions: Fall Restrictions Weight Bearing Restrictions: No      Mobility Bed Mobility Overal bed mobility: Needs Assistance Bed Mobility: Supine to Sit     Supine to sit: Min assist       Transfers Overall transfer level: Needs assistance   Transfers: Sit to/from Stand Sit to Stand: Mod assist         General transfer comment: Husband assisted on one side; he is used to assisting as needed.  assist for coming forward and up.      Balance     Sitting balance-Leahy Scale: Fair       Standing balance-Leahy Scale: Poor Standing balance comment: reliant on external support of some kind                           ADL either performed or assessed with clinical judgement   ADL Overall ADL's : Needs assistance/impaired Eating/Feeding: Set up;Sitting   Grooming: Set up;Sitting   Upper Body Bathing: Set up;Sitting   Lower Body Bathing: Maximal assistance;Sit to/from stand   Upper Body Dressing : Minimal assistance;Sitting   Lower Body Dressing: Maximal assistance;Sit to/from stand   Toilet Transfer: Minimal assistance;Stand-pivot;RW   Toileting-  Clothing Manipulation and Hygiene: Minimal assistance;Sit to/from stand         General ADL Comments: provided built up foam for pt to try on utensils for less stress/effort with hands.  Pt's husband assisted with bed mobility and for sit to stand; he helps her as needed at home.  Took a few steps to sink with min A for safety.  RW was a little high for her at lowest setting. Dyspnea 2/4 but 02 sats remained 94-95% on 4 liters 02.  Pt does not use 02 at home     Vision         Perception     Praxis      Pertinent Vitals/Pain Pain Assessment: Faces Faces Pain Scale: Hurts little more Pain Location: arthritic joints with exercise Pain Descriptors / Indicators: Sore;Grimacing Pain Intervention(s): Monitored during session;Limited activity within patient's tolerance     Hand Dominance     Extremity/Trunk Assessment Upper Extremity Assessment Upper Extremity Assessment: Generalized weakness (arthritic changes present bil UEs)           Communication Communication Communication: No difficulties   Cognition Arousal/Alertness: Awake/alert Behavior During Therapy: WFL for tasks assessed/performed Overall Cognitive Status: Within Functional Limits for tasks assessed  General Comments       Exercises    Shoulder Instructions      Home Living Family/patient expects to be discharged to:: Private residence Living Arrangements: Spouse/significant other Available Help at Discharge: Family;Available 24 hours/day               Bathroom Shower/Tub: Producer, television/film/videoWalk-in shower   Bathroom Toilet: Handicapped height     Home Equipment: Shower seat - built in;Walker - 4 wheels;Cane - single point          Prior Functioning/Environment Level of Independence: Needs assistance        Comments: husband helps as pt needs it        OT Problem List: Decreased strength;Decreased activity tolerance;Impaired balance (sitting and/or  standing);Decreased knowledge of use of DME or AE;Pain;Cardiopulmonary status limiting activity      OT Treatment/Interventions: Self-care/ADL training;DME and/or AE instruction;Balance training;Patient/family education;Therapeutic activities    OT Goals(Current goals can be found in the care plan section) Acute Rehab OT Goals Patient Stated Goal: to get better OT Goal Formulation: With patient Time For Goal Achievement: 09/18/16 Potential to Achieve Goals: Good ADL Goals Pt Will Perform Grooming: with supervision;standing Pt Will Transfer to Toilet: with min guard assist;ambulating;bedside commode Pt Will Perform Toileting - Clothing Manipulation and hygiene: with min guard assist;sit to/from stand Additional ADL Goal #1: pt will perform LB adls at min A level  OT Frequency: Min 2X/week   Barriers to D/C:            Co-evaluation              AM-PAC PT "6 Clicks" Daily Activity     Outcome Measure Help from another person eating meals?: A Little Help from another person taking care of personal grooming?: A Little Help from another person toileting, which includes using toliet, bedpan, or urinal?: A Little Help from another person bathing (including washing, rinsing, drying)?: A Lot Help from another person to put on and taking off regular upper body clothing?: A Little Help from another person to put on and taking off regular lower body clothing?: A Lot 6 Click Score: 16   End of Session Nurse Communication:  (sats)  Activity Tolerance: Patient tolerated treatment well Patient left: in bed;with call bell/phone within reach;with bed alarm set;with restraints reapplied  OT Visit Diagnosis: Muscle weakness (generalized) (M62.81)                Time: [id number] OT Time Calculation (min): 21 min Charges:  OT General Charges $OT Visit: 1 Procedure OT Evaluation $OT Eval Moderate Complexity: 1 Procedure G-Codes: OT G-codes **NOT FOR INPATIENT CLASS** Functional  Assessment Tool Used: Clinical judgement Functional Limitation: Self care Self Care Current Status (E9528(G8987): At least 60 percent but less than 80 percent impaired, limited or restricted Self Care Goal Status (U1324(G8988): At least 20 percent but less than 40 percent impaired, limited or restricted   Mary [name], OTR/L [phone] 09/04/2016  Cyniah Gossard 09/04/2016, 11:51 AM

## 2016-09-04 NOTE — Progress Notes (Addendum)
During ambulation, Sat O2 was 95%, patient being on 4L oxygen [place]. Continue to have SOB with exertion. Will continue to monitor.

## 2016-09-04 NOTE — Progress Notes (Signed)
  Echocardiogram 2D Echocardiogram has been performed.  Mary [name], Mary [name] 09/04/2016, 9:38 AM

## 2016-09-04 NOTE — Progress Notes (Signed)
  Date: 09/04/2016  Patient name: Mary [name]  Medical record number: [id number]  Date of birth: 1940/09/23   I have seen and evaluated Mary [name] and discussed their care with the Residency Team. [name] is a89 year old female who presented to the ED due to the symptoms of heart racing. She was incidentally found to be hypoxic at 88% on room air. Her O2 sat improved to 93% on 2 L. She has had episodes of heart racing which she associates with her anxiety and usually resolve after taking her pill. This episode lasted longer and did not completely resolve with her health. She was incidentally found to be hypoxic and a CT of the chest was ordered to rule out a PE and was negative for a PE. On pointed questioning, she endorses dyspnea on exertion over a few months with decreasing activity.   She has rheumatoid arthritis and saw her rheumatologist last month. They stopped methotrexate with intention switching to leflunomide. Leflunomide has not yet been started due to a UTI.  This morning, she feels a little bit better.er O2 sat on ambulation while wearing 4 L of oxygen was 95%. She did have dyspnea on exertion.  PMHx, Fam Hx, and/or Soc Hx : Per Dr Mary [name]'s H&P  Vitals:   09/04/16 0538 09/04/16 1405  BP: (!) 107/58 110/63  Pulse: 95 99  Resp: 18 18  Temp: 97.6 F (36.4 C) 98 F (36.7 C)  sitting in recliner with nasal cannula. No acute distress Not using accessory muscles Heart regular rate and rhythm no MRG Lungs good airflow with crackles right base most prominent Extremities no edema Hands marked deformity bilaterally with swan neck  I personally viewed her CXR images and confirmed my reading with the official read. Diffuse interstitial pattern throughout her lungs bilaterally  I personally reviewed her CT chest images and confirmed my reading with the official read. We also reviewed her CT scan with the radiologist discussing the findings with her clinical picture.she has  ground glass opacities bilaterally that her lungs with mosaic pattern. Interstitial lung disease remains the primary differential although a hypersensitivity pneumonitis is also possible  I personally viewed her EKG and confirmed my reading with the official read. Sinus, normal axis, no ischemic changes  Assessment and Plan: I have seen and evaluated the patient as outlined above. I agree with the formulated Assessment and Plan as detailed in the residents' note, with the following changes: [name] is a 76 year old woman with rheumatoid arthritis who came to the hospital for racing heart that was incidentally found to have hypoxia and ground glass opacities on a CT scan. She endorses slowly progressive dyspnea on exertion over several months. We do not know the chronicity of her radiographic changes that we will attempt to get old imaging from her rheumatologist. Although pulmonary edema within the differential for her radiographic findings, we did not feel that she had pulmonary edema and therefore did not diurese her. Instead, we felt interstitial lung disease was the top differential diagnosis and she was started on prednisone with slight improvement in her symptoms after less than 24 hours. Pulmonology is also following patient and/or checking autoimmune panels and considering bronchoscopy.  1. Continue steroids 2. Follow-up echo results 3. Continue oxygen as needed 4. PTOT evaluation   Burns SpainButcher, Elizabeth A, MD 7/6/20182:50 PM

## 2016-09-04 NOTE — Progress Notes (Addendum)
Name: Mary [name] MRN: [id number] DOB: 05-24-1940    ADMISSION DATE:  09/03/2016 CONSULTATION DATE:  09/03/2016  REFERRING MD :  Dr. Rogelia BogaButcher  CHIEF COMPLAINT:  Palpitations  HISTORY OF PRESENT ILLNESS:  76 year old female with PMH as below, which is significant for DM, seronegative RA, gout, HTN, HLD, and hypothyroidism. Recently diagnosed with UTI as well. She has been treated for RA with leflunomide for quite some time and was in the process of being switched to leflunomide for unclear reasons. She has, however, not taken her first dose of this yet. [facility] complaints of palpitations and anxiety, which did not resolve with a "nerve pill" as they typically do. Upon arrival to the ED she was found to be hypoxemic with O2 sats in the 80s. She was given supplemental oxygen and sats improved to the low 90s. She went for a CTA of the chest, which was negative for PE, but did identify bilateral GGO. She was admitted to the Internal Medicine Teaching Service and PCCM was asked to see in consultation.   Upon my evaluation patient reports that her SOB has gradually progressed over a period of weeks. Report chronic sinus congestion and GERD that go back some time, but the dyspnea is new and worse on exertion. She denies associated cough, mucous production, nausea, vomiting. She denies smoking ever, drinking, illegal drug use. She did work in a [employer] for several years about 12-15 years ago. She has not been around birds other than that. No other unusual animal exposures.   SIGNIFICANT EVENTS  7/5 admit with hypoxia.  STUDIES:  CTA chest 7/5 > Negative for pulmonary embolus. Extensive bilateral ground-glass opacities are nonspecific and could be due to pulmonary edema in this patient with cardiomegaly, ARDS or hypersensitivity pneumonitis among other entities. Bilateral hilar and mediastinal lymph nodes as described above are likely reactive. Calcific aortic and coronary  atherosclerosis. Hiatal hernia. I have reviewed the images personally.  PAST MEDICAL HISTORY :   has a past medical history of Complication of anesthesia; Diabetes mellitus without complication (HCC); Dyspnea; Gout; High cholesterol; History of kidney stones; Hypertension; Hypoxia (08/2016); Osteoarthritis; PONV (postoperative nausea and vomiting); and Rheumatoid arthritis (HCC).  has a past surgical history that includes Abdominal hysterectomy. Prior to Admission medications   Medication Sig Start Date End Date Taking? Authorizing Provider  alendronate (FOSAMAX) 70 MG tablet Take 1 tablet (70 mg total) by mouth once a week. Take with a full glass of water on an empty stomach. Patient taking differently: Take 70 mg by mouth 3 (three) times a week. Monday, Wednesday and  Friday  Take with a full glass of water on an empty stomach. 06/22/16  Yes Deveshwar, Janalyn RouseShaili, MD  allopurinol (ZYLOPRIM) 100 MG tablet Take 100 mg by mouth 2 (two) times daily.  08/05/15  Yes [provider]  amLODipine-benazepril (LOTREL) 5-20 MG capsule Take 1 capsule by mouth daily.    Yes [provider]  carvedilol (COREG) 6.25 MG tablet Take 6.25 mg by mouth 2 (two) times daily with a meal.    Yes [provider]  Cholecalciferol (VITAMIN D3) 5000 units TABS Take 1 tablet by mouth daily.    Yes [provider]  clorazepate (TRANXENE) 7.5 MG tablet Take 7.5 mg by mouth 2 (two) times daily.    Yes [provider]  folic acid (FOLVITE) 1 MG tablet Take 2 tablets (2 mg total) by mouth daily. 03/04/16 05/28/17 Yes Panwala, Naitik,  PA-C  levothyroxine (SYNTHROID, LEVOTHROID) 75 MCG tablet Take 75 mcg by mouth daily.  10/24/15  Yes [provider]  metFORMIN (GLUCOPHAGE) 500 MG tablet Take 500 mg by mouth 2 (two) times daily with a meal.    Yes [provider]  sulfamethoxazole-trimethoprim (BACTRIM DS,SEPTRA DS) 800-160 MG tablet Take 1 tablet by mouth 2 (two) times daily.    Yes [provider]  atorvastatin (LIPITOR) 10 MG tablet Take 10 mg by mouth daily. 06/30/16   [provider]   Allergies  Allergen Reactions  . Aspirin Other (See Comments)    Ask patient  . Codeine Other (See Comments)    Ask patient    FAMILY HISTORY:  family history is not on file. SOCIAL HISTORY:  reports that she has never smoked. She has never used smokeless tobacco. She reports that she does not drink alcohol or use drugs.  REVIEW OF SYSTEMS:   Review of Systems:   Bolds are positive  Constitutional: weight loss, gain, night sweats, Fevers, chills, fatigue .  HEENT: headaches, Sore throat, sneezing, nasal congestion, post nasal drip, Difficulty swallowing, Tooth/dental problems, visual complaints visual changes, ear ache CV:  chest pain, radiates,Orthopnea, PND, swelling in lower extremities, dizziness, palpitations, syncope.  GI  heartburn, indigestion, abdominal pain, nausea, vomiting, diarrhea, change in bowel habits, loss of appetite, bloody stools.  Resp: cough, productive, hemoptysis, dyspnea, chest pain, pleuritic.  Skin: rash or itching or icterus GU: dysuria, change in color of urine, urgency or frequency. flank pain, hematuria  MS: joint pain or swelling. decreased range of motion  Psych: change in mood or affect. depression or anxiety.  Neuro: difficulty with speech, weakness, numbness, ataxia   SUBJECTIVE:   VITAL SIGNS: Temp:  [97.6 F (36.4 C)-98.4 F (36.9 C)] 97.6 F (36.4 C) (07/06 0538) Pulse Rate:  [85-95] 95 (07/06 0538) Resp:  [18-20] 18 (07/06 0538) BP: (105-115)/(51-58) 107/58 (07/06 0538) SpO2:  [89 %-97 %] 94 % (07/06 0818) FiO2 (%):  [28 %] 28 % (07/05 1333) Weight:  [147 lb 11.2 oz (67 kg)] 147 lb 11.2 oz (67 kg) (07/06 0643)  PHYSICAL EXAMINATION: Gen:      No acute distress HEENT:  EOMI, sclera anicteric Neck:     No masses; no thyromegaly Lungs:    Scattered crackles; normal respiratory effort CV:         Regular  rate and rhythm; no murmurs Abd:      + bowel sounds; soft, non-tender; no palpable masses, no distension Ext:    No edema; adequate peripheral perfusion, ulnar deviation of fingers Skin:      Warm and dry; no rash Neuro: alert and oriented x 3 Psych: normal mood and affect   Recent Labs Lab 09/03/16 0509 09/04/16 0504  NA 137 133*  K 5.3* 5.4*  CL 109 108  CO2 19* 17*  BUN 23* 21*  CREATININE 0.96 0.75  GLUCOSE 135* 248*    Recent Labs Lab 09/03/16 0509 09/04/16 0504  HGB 11.4* 10.8*  HCT 35.9* 35.4*  WBC 7.5 4.0  PLT 412* 410*   Dg Chest 2 View  Result Date: 09/03/2016 CLINICAL DATA:  Acute onset of shortness of breath and decreased O2 saturation. Tachycardia. Initial encounter. EXAM: CHEST  2 VIEW COMPARISON:  None. FINDINGS: The lungs are well-aerated. Vascular congestion is noted. Hazy interstitial prominence raises concern for pulmonary edema, more prominent on the left. There is no evidence of pleural effusion or pneumothorax. The heart is borderline enlarged. No acute  osseous abnormalities are seen. IMPRESSION: Vascular congestion and borderline cardiomegaly. Hazy interstitial prominence raises concern for pulmonary edema, more prominent on the left. Electronically Signed   By: Roanna Raider M.D.   On: 09/03/2016 05:51   Ct Angio Chest Pe W/cm &/or Wo Cm  Result Date: 09/03/2016 CLINICAL DATA:  Recent onset of shortness of breath and exertional hypoxia. EXAM: CT ANGIOGRAPHY CHEST WITH CONTRAST TECHNIQUE: Multidetector CT imaging of the chest was performed using the standard protocol during bolus administration of intravenous contrast. Multiplanar CT image reconstructions and MIPs were obtained to evaluate the vascular anatomy. CONTRAST:  55 cc Isovue 370. COMPARISON:  Single-view of the chest this same day. FINDINGS: Cardiovascular: No pulmonary embolus is identified. Calcific aortic and coronary atherosclerosis is seen. There is cardiomegaly. No pericardial effusion.  Mediastinum/Nodes: The patient has a small to moderate hiatal hernia. Thyroid gland appears normal. Right hilar lymph node on image 53 measures 2 cm short axis dimension. Left hilar lymph node on image 53 measures 1.1 cm short axis dimension. Subcarinal lymph node on image 61 measures 1.3 cm short axis dimension. Lungs/Pleura: No pleural effusion. Extensive bilateral ground-glass opacities are seen. No nodule or mass. Upper Abdomen: Negative. Musculoskeletal: No lytic or sclerotic lesion. Review of the MIP images confirms the above findings. IMPRESSION: Negative for pulmonary embolus. Extensive bilateral ground-glass opacities are nonspecific and could be due to pulmonary edema in this patient with cardiomegaly, ARDS or hypersensitivity pneumonitis among other entities. Bilateral hilar and mediastinal lymph nodes as described above are likely reactive. Calcific aortic and coronary atherosclerosis. Hiatal hernia. Electronically Signed   By: Drusilla Kanner M.D.   On: 09/03/2016 07:51    ASSESSMENT / PLAN: Acute hypoxemic respiratory failure with diffuse bilateral pulmonary infiltrates Has history of rheumatoid arthritis and was till recently on methotrexate.  DDx is broad including RA-ILD, volume overload, viral infection. Doubt methotrexate lung toxicity as symptoms developed after it was stopped. She could have an oppurtunistic infection but does not appear toxic at present.   She may a bronch with BAL. Over the weekend we will treat with steroids and try diuresis. If no improvement then we will proceed with bronch, tentatively scheduled for Monday at 9 am (NPO after midnight on Sunday).   - Continue steroids.  - Consider lasix for diuresis - Follow echo - Follow autommune panel (ANA, RF, CRP, sed rate). Add CCP, hypersensitivity panel, beta D glucan. - Check resp virus panel, Pct  Chilton Greathouse MD [facility] Pager [phone] If no answer or after 3pm call:  [phone] 09/04/2016, 3:03 PM

## 2016-09-04 NOTE — Progress Notes (Signed)
Inpatient Diabetes Program Recommendations  AACE/ADA: New Consensus Statement on Inpatient Glycemic Control (2015)  Target Ranges:  Prepandial:   less than 140 mg/dL      Peak postprandial:   less than 180 mg/dL (1-2 hours)      Critically ill patients:  140 - 180 mg/dL   Results for Mary [name], Mary [name] (MRN [id number]) as of 09/04/2016 10:07  Ref. Range 09/03/2016 12:09 09/03/2016 17:40 09/03/2016 22:49 09/04/2016 07:52  Glucose-Capillary Latest Ref Range: 65 - 99 mg/dL 409203 (H) 811117 (H) 914304 (H) 226 (H)   Review of Glycemic Control  Diabetes history: DM 2 Outpatient Diabetes medications: Metformin 500 mg BID Current orders for Inpatient glycemic control: Novolog resistant 0-20 units tid + Novolog HS scale  Inpatient Diabetes Program Recommendations:    Patient receiving IV Solumedrol 60 mg Q8 hours. Glucose in the 200-300 range since starting on steroids. Consider Novolog 3-5 units tid meal coverage if patient is consuming >50% of meal.  Thanks,  Christena DeemShannon Kynnedi Zweig RN, MSN, [facility] Inpatient Diabetes Coordinator Team Pager [phone] (8a-5p)

## 2016-09-04 NOTE — Progress Notes (Signed)
Physical Therapy Treatment Patient Details Name: Mary MeierGaynelle Gentle MRN: [id number] DOB: November 26, 1940 Today's Date: 09/04/2016    History of Present Illness Pt is a 76 y/o female admitted secondary to hypoxia. CT angio of the chest negative for PE. PMH includes RA, gout, DM, and HTN.     PT Comments    Progressing steadily with maintaining SpO2.  Pt will need assistance consistently, but is steadily feeling more stable with less assist.   Follow Up Recommendations  Home health PT;Supervision/Assistance - 24 hour     Equipment Recommendations  Rolling walker with 5" wheels (if RW will need grip support/adaptation)    Recommendations for Other Services       Precautions / Restrictions Precautions Precautions: Fall Restrictions Weight Bearing Restrictions: No    Mobility  Bed Mobility Overal bed mobility: Needs Assistance Bed Mobility: Supine to Sit     Supine to sit: Min assist     General bed mobility comments: pt use asymmetrical scoot technique to get to EOB without any UE's and minimal assist  Transfers Overall transfer level: Needs assistance   Transfers: Sit to/from Stand Sit to Stand: Mod assist         General transfer comment: cues for coming more forward, assist for coming forward and up.  Each time pt needed stability assist until she was in control of stance.  Ambulation/Gait Ambulation/Gait assistance: Min assist Ambulation Distance (Feet): 60 Feet (then addidtional 30 feet after rest.) Assistive device: 1 person hand held assist Gait Pattern/deviations: Step-through pattern;Decreased step length - right;Decreased step length - left;Decreased stride length Gait velocity: Decreased Gait velocity interpretation: Below normal speed for age/gender General Gait Details: slow unsteady, though improving gait over distance, with excess lateral w/shift due to little kne flexion. Fairly quick to fatigue.  On 4L during gait sats stayed at 90% with EHR 105 bpm and  91% at 104 bpm   Stairs            Wheelchair Mobility    Modified Rankin (Stroke Patients Only)       Balance     Sitting balance-Leahy Scale: Fair       Standing balance-Leahy Scale: Poor Standing balance comment: reliant on external support of some kind                            Cognition Arousal/Alertness: Awake/alert Behavior During Therapy: WFL for tasks assessed/performed Overall Cognitive Status: Within Functional Limits for tasks assessed                                        Exercises General Exercises - Lower Extremity Heel Slides: AAROM;AROM;Strengthening;20 reps;Other (comment) (some graded resistance once warmed up.) Other Exercises Other Exercises: bicep/tricep pressess with stress at wrist and hands spared.    General Comments        Pertinent Vitals/Pain Pain Assessment: Faces Faces Pain Scale: Hurts little more Pain Location: arthritic joints with exercise Pain Descriptors / Indicators: Sore;Grimacing Pain Intervention(s): Monitored during session;Limited activity within patient's tolerance    Home Living                      Prior Function            PT Goals (current goals can now be found in the care plan section) Acute Rehab PT Goals Patient Stated Goal:  to get better PT Goal Formulation: With patient Time For Goal Achievement: 09/17/16 Potential to Achieve Goals: Fair Progress towards PT goals: Progressing toward goals    Frequency    Min 3X/week      PT Plan Current plan remains appropriate    Co-evaluation              AM-PAC PT "6 Clicks" Daily Activity  Outcome Measure  Difficulty turning over in bed (including adjusting bedclothes, sheets and blankets)?: Total Difficulty moving from lying on back to sitting on the side of the bed? : Total Difficulty sitting down on and standing up from a chair with arms (e.g., wheelchair, bedside commode, etc,.)?: Total Help  needed moving to and from a bed to chair (including a wheelchair)?: A Lot Help needed walking in hospital room?: A Little Help needed climbing 3-5 steps with a railing? : A Lot 6 Click Score: 10    End of Session Equipment Utilized During Treatment: Oxygen Activity Tolerance: Patient tolerated treatment well Patient left: in chair;with call bell/phone within reach;with nursing/sitter in room;with family/visitor present Nurse Communication: Mobility status PT Visit Diagnosis: Other abnormalities of gait and mobility (R26.89)     Time: 8469-6295 PT Time Calculation (min) (ACUTE ONLY): 27 min  Charges:  $Gait Training: 8-22 mins $Therapeutic Exercise: 8-22 mins                    G Codes:       2016-09-17  Lusby Bing, PT [phone]  (pager)   Eliseo Gum Harpreet Pompey 09-17-2016, 11:41 AM

## 2016-09-05 LAB — BASIC METABOLIC PANEL
ANION GAP: 8 (ref 5–15)
BUN: 28 mg/dL — ABNORMAL HIGH (ref 6–20)
CHLORIDE: 107 mmol/L (ref 101–111)
CO2: 18 mmol/L — ABNORMAL LOW (ref 22–32)
Calcium: 9.3 mg/dL (ref 8.9–10.3)
Creatinine, Ser: 0.84 mg/dL (ref 0.44–1.00)
GFR calc Af Amer: >60 mL/min (ref >60)
GFR calc non Af Amer: >60 mL/min (ref >60)
GLUCOSE: 296 mg/dL — AB (ref 65–99)
POTASSIUM: 5.1 mmol/L (ref 3.5–5.1)
Sodium: 133 mmol/L — ABNORMAL LOW (ref 135–145)

## 2016-09-05 LAB — GLUCOSE, CAPILLARY
GLUCOSE-CAPILLARY: 225 mg/dL — AB (ref 65–99)
GLUCOSE-CAPILLARY: 245 mg/dL — AB (ref 65–99)
Glucose-Capillary: 259 mg/dL — ABNORMAL HIGH (ref 65–99)
Glucose-Capillary: 217 mg/dL — ABNORMAL HIGH (ref 65–99)

## 2016-09-05 LAB — RHEUMATOID FACTOR: Rhuematoid fact SerPl-aCnc: <10 IU/mL (ref 0.0–13.9)

## 2016-09-05 MED ORDER — INSULIN ASPART 100 UNIT/ML ~~LOC~~ SOLN
5.0000 [IU] | Freq: Three times a day (TID) | SUBCUTANEOUS | Status: DC
  Administered 2016-09-05 – 2016-09-06 (×3): 5 [IU] via SUBCUTANEOUS

## 2016-09-05 MED ORDER — IPRATROPIUM-ALBUTEROL 0.5-2.5 (3) MG/3ML IN SOLN
3.0000 mL | Freq: Two times a day (BID) | RESPIRATORY_TRACT | Status: DC
  Administered 2016-09-05 (×2): 3 mL via RESPIRATORY_TRACT
  Filled 2016-09-05 (×2): qty 3

## 2016-09-05 NOTE — Progress Notes (Signed)
   Subjective: Mary [name] slept well without disturbance and is eating the entirety of offered meals. She walked with oxygen on yesterday and tolerated this well.  Objective:  Vital signs in last 24 hours: Vitals:   09/05/16 1042 09/05/16 1157 09/05/16 1226 09/05/16 1604  BP:   113/77   Pulse: 84 77 98 80  Resp: (!) 22  20 20   Temp:   98.3 F (36.8 C)   TempSrc:      SpO2: 92% 97% 91% 91%  Weight:      Height:       Physical Exam  Constitutional: She appears well-developed and well-nourished.  HENT:  Mouth/Throat: Oropharynx is clear and moist. No oropharyngeal exudate.  Cardiovascular: Normal rate, regular rhythm and normal heart sounds.   Pulmonary/Chest: Effort normal. No respiratory distress.  Inspiratory crackles prominent in bilateral lower lung fields  Musculoskeletal: She exhibits deformity. She exhibits no edema.  Extensive erosive changes in hands and feet with joint space loss, swan neck deformities, and lateral deviation   Assessment/Plan: Hypoxemia Dyspnea on Exertion Abnormalities on CTA Chest The differential remains open with possible hypersensitivity pneumonitis, immune reconstitution, atypical pulmonary infection, cryptogenic organizing pneumonia, or rheumatoid arthritis associated interstitial lung disease. So far she has partially responded to high dose steroids, making severe fibrotic interstitial disease either only partly contributory or not the cause. Any of these others would be expected to improve on steroids. Currently we plan for bronchoscopy Monday to rule out an atypical infectious process. We will continue to try weaning supplemental oxygen and encouraging ambulation. Continuing IV steroids through the weekend. I see no evidence for pulmonary edema needing diuresis.  Hyperkalemia: She continues to have a mild metabolic acidosis and borderline high potassium. This probably dates back to November 2017 with a CO2 of 19 at that time. Type 4 RTA is a  possible considering she has longstanding diabetes, but this is less likely without associated nephropathy. We will continue to follow up in case potassium raises significantly.  RA She has extensive chronic joint deformities from RA. She was on methotrexate until about a month ago when it was discontinued with plans to start leflunomide by her rheumatologist, Dr. Corliss Skainseveshwar. We have not been able to get outside office records to compare with past chest xrays at this time.  DM:  Blood sugars are poorly controlled today due to her methylprednisolone. We will change her diet to lower carbohydrate and add a base dose of insulin aspart at meal times on top of a supplemental scale.  HTN We are holding her amlodipine-benazepril here due to low-normal blood pressure Continue coreg 6.25mg  BID  DVT PPx: Enoxaparin F/E/N: Carb Modified FULL CODE  Dispo: Anticipated discharge to home in about 2 days  Fuller Planhristopher W Hydie Langan, MD [facility] Resident Pager# [phone] 09/05/2016, 6:14 PM

## 2016-09-06 LAB — BASIC METABOLIC PANEL
Anion gap: 9 (ref 5–15)
BUN: 28 mg/dL — ABNORMAL HIGH (ref 6–20)
CALCIUM: 9.2 mg/dL (ref 8.9–10.3)
CO2: 19 mmol/L — AB (ref 22–32)
CREATININE: 0.69 mg/dL (ref 0.44–1.00)
Chloride: 107 mmol/L (ref 101–111)
GFR calc Af Amer: >60 mL/min (ref >60)
GFR calc non Af Amer: >60 mL/min (ref >60)
GLUCOSE: 277 mg/dL — AB (ref 65–99)
Potassium: 4.9 mmol/L (ref 3.5–5.1)
Sodium: 135 mmol/L (ref 135–145)

## 2016-09-06 LAB — GLUCOSE, CAPILLARY
Glucose-Capillary: 255 mg/dL — ABNORMAL HIGH (ref 65–99)
Glucose-Capillary: 156 mg/dL — ABNORMAL HIGH (ref 65–99)
Glucose-Capillary: 268 mg/dL — ABNORMAL HIGH (ref 65–99)

## 2016-09-06 LAB — PROCALCITONIN

## 2016-09-06 LAB — CYCLIC CITRUL PEPTIDE ANTIBODY, IGG/IGA: CCP ANTIBODIES IGG/IGA: 7 U (ref 0–19)

## 2016-09-06 MED ORDER — INSULIN ASPART 100 UNIT/ML ~~LOC~~ SOLN
0.0000 [IU] | Freq: Three times a day (TID) | SUBCUTANEOUS | Status: DC
  Administered 2016-09-06: 8 [IU] via SUBCUTANEOUS
  Administered 2016-09-07: 3 [IU] via SUBCUTANEOUS
  Administered 2016-09-07: 11 [IU] via SUBCUTANEOUS
  Administered 2016-09-07: 5 [IU] via SUBCUTANEOUS

## 2016-09-06 MED ORDER — METHYLPREDNISOLONE SODIUM SUCC 125 MG IJ SOLR
60.0000 mg | Freq: Two times a day (BID) | INTRAMUSCULAR | Status: DC
  Administered 2016-09-06 – 2016-09-07 (×2): 60 mg via INTRAVENOUS
  Filled 2016-09-06 (×2): qty 2

## 2016-09-06 MED ORDER — INSULIN ASPART 100 UNIT/ML ~~LOC~~ SOLN
0.0000 [IU] | Freq: Every day | SUBCUTANEOUS | Status: DC
  Administered 2016-09-06: 2 [IU] via SUBCUTANEOUS

## 2016-09-06 MED ORDER — INSULIN ASPART 100 UNIT/ML ~~LOC~~ SOLN
4.0000 [IU] | Freq: Three times a day (TID) | SUBCUTANEOUS | Status: DC
  Administered 2016-09-06 – 2016-09-07 (×4): 4 [IU] via SUBCUTANEOUS

## 2016-09-06 NOTE — Progress Notes (Signed)
   Subjective: [name] feels very hungry this morning and is otherwise without complaints. She is off supplemental oxygen and comfortably out of bed and walking to the nursing station. Multiple family members are present in the hospital room and do not have new concerns with progress today.  Objective:  Vital signs in last 24 hours: Vitals:   09/05/16 1834 09/05/16 1954 09/05/16 2121 09/06/16 0457  BP: 121/61  115/69 120/63  Pulse: 83  82 74  Resp:   19 18  Temp:   97.8 F (36.6 C) (!) 97.5 F (36.4 C)  TempSrc:   Oral Oral  SpO2:  90% 93% 94%  Weight:    154 lb 1.6 oz (69.9 kg)  Height:       Physical Exam  Constitutional: She appears well-developed and well-nourished.  Cardiovascular: Normal rate, regular rhythm and normal heart sounds.   Pulmonary/Chest: Effort normal. No respiratory distress.  Bilateral lower lung field inspiratory crackles  Musculoskeletal: She exhibits deformity. She exhibits no edema.   Assessment/Plan: Hypoxemia Dyspnea on Exertion Ground glass opacities on CTA Chest The differential remains open with possible hypersensitivity pneumonitis, immune reconstitution, atypical pulmonary infection, cryptogenic organizing pneumonia, or rheumatoid arthritis associated interstitial lung disease. She continues to improve significantly and is now ambulatory and out of bed without supplemental oxygen in no distress. Her bibasilar inspiratory crackles are improved compared to previous days' examinations. Currently we still plan for bronchoscopy Monday to rule out an atypical infectious process. Pulmonology recommendations are appreciated. I think it's reasonable to decrease the frequency of her IV Solu-Medrol treatments. We are getting repeat chest x-ray in the morning prior to any invasive study.  RA She currently has no symptomatic complaints while on high-dose glucocorticoids during this hospitalization. Reviewing on the EMR indicates no past pulmonary  complications or chest x-ray results from her clinic visits with Dr. Corliss Skainseveshwar. We can try to contact the clinic again during business hours tomorrow and see if these previous records are available.  DM:  Blood sugars better controlled now on increased prandial insulin dosing to compensate for her high dose steroids  HTN We are holding her amlodipine-benazepril here due to low-normal blood pressure Continue coreg 6.25mg  BID  DVT PPx: Enoxaparin F/E/N: Carb Modified, NPO @[place]  FULL CODE  Dispo: Anticipated discharge to home in about 1-2 days  Fuller Planhristopher W Chasta Deshpande, MD [facility] Resident Pager# [phone] 09/06/2016, 2:20 PM

## 2016-09-07 ENCOUNTER — Encounter (HOSPITAL_COMMUNITY)
Admission: EM | Disposition: A | Discharge status: Home Health | Payer: Self-pay | Source: Home / Self Care | Attending: Internal Medicine

## 2016-09-07 ENCOUNTER — Inpatient Hospital Stay (HOSPITAL_COMMUNITY): Payer: Medicare HMO

## 2016-09-07 ENCOUNTER — Encounter (HOSPITAL_COMMUNITY): Payer: Medicare HMO

## 2016-09-07 DIAGNOSIS — M0609 Rheumatoid arthritis without rheumatoid factor, multiple sites: Secondary | ICD-10-CM

## 2016-09-07 DIAGNOSIS — J9601 Acute respiratory failure with hypoxia: Principal | ICD-10-CM

## 2016-09-07 DIAGNOSIS — J9600 Acute respiratory failure, unspecified whether with hypoxia or hypercapnia: Secondary | ICD-10-CM

## 2016-09-07 DIAGNOSIS — M1A09X0 Idiopathic chronic gout, multiple sites, without tophus (tophi): Secondary | ICD-10-CM

## 2016-09-07 DIAGNOSIS — Z885 Allergy status to narcotic agent status: Secondary | ICD-10-CM

## 2016-09-07 DIAGNOSIS — Z886 Allergy status to analgesic agent status: Secondary | ICD-10-CM

## 2016-09-07 DIAGNOSIS — M0519 Rheumatoid lung disease with rheumatoid arthritis of multiple sites: Secondary | ICD-10-CM

## 2016-09-07 DIAGNOSIS — M0510 Rheumatoid lung disease with rheumatoid arthritis of unspecified site: Secondary | ICD-10-CM

## 2016-09-07 LAB — CBC
HEMATOCRIT: 35 % — AB (ref 36.0–46.0)
HEMOGLOBIN: 11.1 g/dL — AB (ref 12.0–15.0)
MCH: 28.8 pg (ref 26.0–34.0)
MCHC: 31.7 g/dL (ref 30.0–36.0)
MCV: 90.7 fL (ref 78.0–100.0)
Platelets: 412 10*3/uL — ABNORMAL HIGH (ref 150–400)
RBC: 3.86 MIL/uL — ABNORMAL LOW (ref 3.87–5.11)
RDW: 17.3 % — AB (ref 11.5–15.5)
WBC: 6.7 10*3/uL (ref 4.0–10.5)

## 2016-09-07 LAB — BASIC METABOLIC PANEL
ANION GAP: 9 (ref 5–15)
BUN: 28 mg/dL — AB (ref 6–20)
CO2: 21 mmol/L — ABNORMAL LOW (ref 22–32)
Calcium: 9.2 mg/dL (ref 8.9–10.3)
Chloride: 107 mmol/L (ref 101–111)
Creatinine, Ser: 0.65 mg/dL (ref 0.44–1.00)
GFR calc Af Amer: >60 mL/min (ref >60)
Glucose, Bld: 245 mg/dL — ABNORMAL HIGH (ref 65–99)
POTASSIUM: 4.9 mmol/L (ref 3.5–5.1)
SODIUM: 137 mmol/L (ref 135–145)

## 2016-09-07 LAB — GLUCOSE, CAPILLARY
Glucose-Capillary: 315 mg/dL — ABNORMAL HIGH (ref 65–99)
Glucose-Capillary: 220 mg/dL — ABNORMAL HIGH (ref 65–99)
Glucose-Capillary: 197 mg/dL — ABNORMAL HIGH (ref 65–99)

## 2016-09-07 SURGERY — BRONCHOSCOPY, WITH FLUOROSCOPY
Anesthesia: Moderate Sedation | Laterality: Bilateral

## 2016-09-07 MED ORDER — PREDNISONE 10 MG PO TABS: 30.0000 mg | ORAL_TABLET | Freq: Every day | ORAL | 0 refills | Status: AC

## 2016-09-07 MED ORDER — GLIPIZIDE 5 MG PO TABS: 5.0000 mg | ORAL_TABLET | Freq: Every day | ORAL | 0 refills | Status: DC

## 2016-09-07 MED ORDER — BENAZEPRIL HCL 20 MG PO TABS: 20.0000 mg | ORAL_TABLET | Freq: Every day | ORAL | 0 refills | Status: DC

## 2016-09-07 MED ORDER — PREDNISONE 20 MG PO TABS
30.0000 mg | ORAL_TABLET | Freq: Every day | ORAL | Status: DC
  Administered 2016-09-07: 30 mg via ORAL
  Filled 2016-09-07: qty 1

## 2016-09-07 NOTE — Discharge Summary (Signed)
Name: Mary [name] MRN: [id number] DOB: 1940-12-31 76 y.o. 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 Take with a full glass of water on an empty stomach. What changed:  when to take this  additional instructions   allopurinol 100 MG tablet Commonly known as:  ZYLOPRIM Take 100 mg by mouth 2 (two) times daily.   atorvastatin 10 MG tablet Commonly known as:  LIPITOR Take 10 mg by mouth daily.   benazepril 20 MG tablet Commonly known as:  LOTENSIN Take 1 tablet (20 mg total) by mouth daily.   carvedilol 6.25 MG tablet Commonly known as:  COREG Take 6.25 mg by mouth 2 (two) times daily with a meal.   clorazepate 7.5 MG tablet Commonly known as:  TRANXENE Take 7.5 mg by mouth 2 (two) times daily.   folic acid 1 MG tablet Commonly known as:  FOLVITE Take 2 tablets (2 mg total) by mouth daily.   glipiZIDE 5 MG tablet Commonly known as:  GLUCOTROL Take 1 tablet (5  mg total) by mouth daily before breakfast.   levothyroxine 75 MCG tablet Commonly known as:  SYNTHROID, LEVOTHROID Take 75 mcg by mouth daily.   metFORMIN 500 MG tablet Commonly known as:  GLUCOPHAGE Take 500 mg by mouth 2 (two) times daily with a meal.   predniSONE 10 MG tablet Commonly known as:  DELTASONE Take 3 tablets (30 mg total) by mouth daily with breakfast. Start taking on:  09/08/2016   Vitamin D3 5000 units Tabs Take 1 tablet by mouth daily.       Disposition and follow-up:   Mary [name] was discharged from [facility] in Stable condition.  At the hospital follow up visit please address:  1. Hypoxia: Monitor for recurrent symptoms. Please follow up on pending studies and further Pulmonology Recommendations.  2. Diabetes: Please monitor for elevated glucose in the setting of steroid therapy. Glipizide was added to help compensate for this increased BG. Adjust/change medication(s) as indicated.  3. Hypertension: Medication change from CCB/ACE-I combination pill to ACE-i alone. Please adjust/chage medication as indicated by outpatient blood pressures.  4.  Labs / imaging needed at time of follow-up: Blood Glucose  3.  Pending labs/ test needing follow-up: Beta D Glucan, Hypersensitivity Pneumonitis Panel  Follow-up Appointments: Follow-up Information    Mary Spatz, NP Follow up on 09/24/2016.   Specialty:  Pulmonary Disease Why:  Follow up  with lung doctors at 10:30 AM. Contact information: [place] [place] [address] [phone]           Hospital Course by problem list:   1. Hypoxia: Mary [name] presented following a episode of anxiety and rapid heart rate that did not resolve with her clorazepate. During evaluation in the ED, she was found to be hypoxic on room air with exertion to 85%. Heart rate was normal. A CTA chest was ordered to evaluate for PE. This study was negative for PE but did note extensive  bilateral ground-glass opacities that were nonspecific. CXR showed "Vascular congestion and borderline cardiomegaly. Hazy interstitial prominence raises concern for pulmonary edema, more prominent on the left". Further questioning revealed SOB with exertion and several months of increasing DOE. Of note, she recently stopped MTX to switch to leflunomide, but had not started her new medication because she had a UTI.  BNP was normal and 2d Echo showed EF 60-65%. She received Duonebs, and Albuterol PRN. Pulmonology was consulted and recommended starting IV steroids and obtaing autoimmune Panel [ESR (47) CRP (2.1)  RF (neg) ANA (positive)], CCP (neg), hypersensitivity panel (pending), beta D Glucan (completed but no result listed), respiratory panel (negative for all) and PCT (neg). She responded well to several days of IV steroids and repeat CXR showed "Overall improved appearance of the lung supporting other edema or an acute inflammatory process." Pulmonology Reccommended discharging on 66m Prednisone Daily with follow-up in their clinic on 09/24/16.  DDx remains broad upon discharge and includes RA-ILD, hypersensitivity pneumonitis, immune reconstitution, and viral infection (unlikely with negative viral panel). Methotrexate lung toxicity is considered to be less like as her symptoms developed after stopping the medication.   2. Seronegative RA: Remained of medications. To follow up with rheumatologist  3. Diabetes Mellitus: Patient experiencing elevated blood sugars with steroid therapy. Discharged with Glipizide in addition to he Metformin to compensate.  4. Hypertension: Blood pressures remained low to normal while hospitalized despite holding her CCB/ACE combination medication. She is being switch to ACE-I without CCB on discharge.  5. HLD, Anxiety, Hypothyroidism, Gout, Osteoporosis: Remained on home regimens throughout hospitalization and on discharge.  Discharge Vitals:   BP 113/67 (BP  Location: Right Arm)   Pulse 83   Temp 98.3 F (36.8 C)   Resp 18   Ht _0  (1.6 m)   Wt 154 lb 1.6 oz (69.9 kg)   SpO2 92%   BMI 27.30 kg/m  (SpO2 92%, Room Air)  Pertinent Labs, Studies, and Procedures:  CBC Latest Ref Rng & Units 09/07/2016 09/04/2016 09/03/2016  WBC 4.0 - 10.5 K/uL 6.7 4.0 7.5  Hemoglobin 12.0 - 15.0 g/dL 11.1(L) 10.8(L) 11.4(L)  Hematocrit 36.0 - 46.0 % 35.0(L) 35.4(L) 35.9(L)  Platelets 150 - 400 K/uL 412(H) 410(H) 412(H)    CMP Latest Ref Rng & Units 09/07/2016 09/06/2016 09/05/2016  Glucose 65 - 99 mg/dL 245(H) 277(H) 296(H)  BUN 6 - 20 mg/dL 28(H) 28(H) 28(H)  Creatinine 0.44 - 1.00 mg/dL 0.65 0.69 0.84  Sodium 135 - 145 mmol/L 137 135 133(L)  Potassium 3.5 - 5.1 mmol/L 4.9 4.9 5.1  Chloride 101 - 111 mmol/L 107 107 107  CO2 22 - 32 mmol/L 21(L) 19(L) 18(L)  Calcium 8.9 - 10.3 mg/dL 9.2 9.2 9.3  Total Protein 6.5 - 8.1 g/dL - - -  Total Bilirubin 0.3 - 1.2 mg/dL - - -  Alkaline Phos 38 - 126 U/L - - -  AST 15 -  41 U/L - - -  ALT 14 - 54 U/L - - -   CTA Chest: IMPRESSION: - Negative for pulmonary embolus. - Extensive bilateral ground-glass opacities are nonspecific and could be due to pulmonary edema in this patient with cardiomegaly, ARDS or hypersensitivity pneumonitis among other entities. - Bilateral hilar and mediastinal lymph nodes as described above arelikely reactive. - Calcific aortic and coronary atherosclerosis. - Hiatal hernia.  CXR (09/03/16) IMPRESSION: - Vascular congestion and borderline cardiomegaly. Hazy interstitial prominence raises concern for pulmonary edema, more prominent on the left.  CXR (09/07/16) IMPRESSION: - Overall improved appearance of the lung supporting other edema or an acute inflammatory process.  Echocardiogram: Study Conclusions: - Left ventricle: The cavity size was normal. Wall thickness was increased in a pattern of mild LVH. Systolic function was normal. - The estimated ejection fraction was in the range of 60% to  65%. - The study is not technically sufficient to allow evaluation of LV diastolic function.  Autoimmune Panel: - ESR: 47 - CRP: 2.1 - RF: neg - ANA: positive - CCP: neg  Hypersensitivity panel: pending   beta D Glucan: completed but no result listed  Respiratory panel: negative for all  PCT: neg   Discharge Instructions: Discharge Instructions    Diet - low sodium heart healthy    Complete by:  As directed    Discharge instructions    Complete by:  As directed    Please follow up with Pulmonology at your upcoming appointment for further evaluation. You will also need to make an appointment with you primary care doctor in the next 2 weeks.  We have changed a few of your medications.  1. Prednisone was ADDED: This is to continue the steroid treatment you were receiving in the hospital. Your dosing will be adjusted as needed at you pulmonology appoitment  2. Glipizide was ADDED: This medication is to help control your blood sugar while it is being further elevated by the steroids you are taking. Please follow up with your Primary Care physician in the next 2 weeks so they can monitor your response and adjust the dose as needed because this may not be the right dose for you.  3. Amlodipine-benazepril was SWITCHED to Benazepril: The combination pill was changed to benazepril alone because you were experiencing low to normal blood pressure in the hospital while not taking the combination pill and we do not want to make your blood pressure too low. Please talk to your primary care doctor about this as your blood pressure may change when you return home.   Increase activity slowly    Complete by:  As directed       Signed: Neva Seat, DO IM PGY-1 Pager: [phone]

## 2016-09-07 NOTE — Progress Notes (Signed)
Inpatient Diabetes Program Recommendations  AACE/ADA: New Consensus Statement on Inpatient Glycemic Control (2015)  Target Ranges:  Prepandial:   less than 140 mg/dL      Peak postprandial:   less than 180 mg/dL (1-2 hours)      Critically ill patients:  140 - 180 mg/dL   Results for Manual MeierCRAVEN, Delynn (MRN [id number]) as of 09/07/2016 11:17  Ref. Range 09/06/2016 08:24 09/06/2016 12:22 09/06/2016 17:10 09/07/2016 07:41  Glucose-Capillary Latest Ref Range: 65 - 99 mg/dL 914255 (H) 782156 (H) 956268 (H) 315 (H)   Review of Glycemic Control  Diabetes history: DM2 Outpatient Diabetes medications: Metformin 500 mg BID Current orders for Inpatient glycemic control: Novolog 0-15 units TID with meals, Novolog 0-5 units QHS, Novolog 4 units TID with meals  Inpatient Diabetes Program Recommendations: Insulin - Basal: If steroids are continued, please consider ordering Lantus 10 units Q24H. Insulin - Meal Coverage: If steroids are continued, please consider increasing meal coverage to Novolog 6 units TID with meals.  Thanks, Orlando PennerMarie Lateya Dauria, RN, MSN, CDE Diabetes Coordinator Inpatient Diabetes Program [phone] (Team Pager from 8am to 5pm)

## 2016-09-07 NOTE — Progress Notes (Signed)
Mary MeierGaynelle [name] to be D/C'd Home per MD order.  Discussed with the patient and all questions fully answered.  VSS, Skin clean, dry and intact without evidence of skin break down, no evidence of skin tears noted. IV catheter discontinued intact. Site without signs and symptoms of complications. Dressing and pressure applied.  An After Visit Summary was printed and given to the patient. Patient received prescription.  D/c education completed with patient/family including follow up instructions, medication list, d/c activities limitations if indicated, with other d/c instructions as indicated by MD - patient able to verbalize understanding, all questions fully answered.   Patient instructed to return to ED, call 911, or call MD for any changes in condition.   Patient escorted via WC, and D/C home via private auto.  [name] 09/07/2016 5:35 PM

## 2016-09-07 NOTE — Progress Notes (Signed)
Physical Therapy Treatment Patient Details Name: Mary [name] MRN: [id number] DOB: [birth year] Today's Date: 09/07/2016    History of Present Illness Pt is a 76 y/o female admitted secondary to hypoxia. CT angio of the chest negative for PE. PMH includes RA, gout, DM, and HTN.     PT Comments    Pt functioning near baseline. SpO2>95% on RA. AMB 200' with RW. Pt safe to d/c home with family once medically stable.     Follow Up Recommendations  Home health PT;Supervision/Assistance - 24 hour     Equipment Recommendations  Rolling walker with 5" wheels    Recommendations for Other Services       Precautions / Restrictions Precautions Precautions: Fall Restrictions Weight Bearing Restrictions: No    Mobility  Bed Mobility Overal bed mobility: Needs Assistance Bed Mobility: Supine to Sit     Supine to sit: Supervision     General bed mobility comments: pt up in chair upon PT arrival  Transfers Overall transfer level: Needs assistance Equipment used: Rolling walker (2 wheeled) Transfers: Sit to/from Stand Sit to Stand: Min assist         General transfer comment: v/c's to push up from chair for initial power up  Ambulation/Gait Ambulation/Gait assistance: Supervision Ambulation Distance (Feet): 200 Feet Assistive device: Rolling walker (2 wheeled) Gait Pattern/deviations: Step-through pattern;Decreased stride length;Trunk flexed Gait velocity: dec Gait velocity interpretation: Below normal speed for age/gender General Gait Details: pt typically uses 4WW and con't to push walker in front of self instead of staying inside walker despite v/c's, however this is how pt amb at home   Stairs            Wheelchair Mobility    Modified Rankin (Stroke Patients Only)       Balance Overall balance assessment: Needs assistance Sitting-balance support: No upper extremity supported;Feet supported Sitting balance-Leahy Scale: Fair     Standing balance  support: Bilateral upper extremity supported;During functional activity Standing balance-Leahy Scale: Poor Standing balance comment: reliant on external support of some kind                            Cognition Arousal/Alertness: Awake/alert Behavior During Therapy: WFL for tasks assessed/performed Overall Cognitive Status: Within Functional Limits for tasks assessed                                        Exercises      General Comments        Pertinent Vitals/Pain Pain Assessment: No/denies pain Faces Pain Scale: Hurts little more Pain Location: between toes with bathing Pain Descriptors / Indicators: Sore;Grimacing Pain Intervention(s): Monitored during session;Repositioned    Home Living Family/patient expects to be discharged to:: Private residence                    Prior Function            PT Goals (current goals can now be found in the care plan section) Acute Rehab PT Goals Patient Stated Goal: go home today Progress towards PT goals: Progressing toward goals    Frequency    Min 3X/week      PT Plan Current plan remains appropriate    Co-evaluation              AM-PAC PT "6 Clicks" Daily Activity  Outcome Measure  Difficulty turning over in bed (including adjusting bedclothes, sheets and blankets)?: Total Difficulty moving from lying on back to sitting on the side of the bed? : Total Difficulty sitting down on and standing up from a chair with arms (e.g., wheelchair, bedside commode, etc,.)?: Total Help needed moving to and from a bed to chair (including a wheelchair)?: A Lot Help needed walking in hospital room?: A Little Help needed climbing 3-5 steps with a railing? : A Little 6 Click Score: 11    End of Session Equipment Utilized During Treatment: Gait belt Activity Tolerance: Patient tolerated treatment well Patient left: in chair;with call bell/phone within reach;with family/visitor present Nurse  Communication: Mobility status PT Visit Diagnosis: Other abnormalities of gait and mobility (R26.89)     Time: 8119-1478 PT Time Calculation (min) (ACUTE ONLY): 17 min  Charges:  $Gait Training: 8-22 mins                    G Codes:      Mary [name], PT, DPT Pager #: [phone] Office #: [phone]   Aladdin Kollmann M Nura Cahoon 09/07/2016, 12:45 PM

## 2016-09-07 NOTE — Progress Notes (Signed)
Patient ID: Manual MeierGaynelle Buening, female   DOB: 02-Jul-1940, 76 y.o.   MRN: [id number]   Subjective: Mrs. Jewel BaizeCraven was seen this morning with family at the bedside. She is feeling well this morning and is hoping to go home today. It was discussed with her that she would be going home with new medications; steroids and an additional medication for her diabetes while she is on steroids. She asked about the results of her echocardiogram and was informed that she received the study to rule out a cardiac component to her breathing problems and that her EF was in the normal range.  Objective:  Vital signs in last 24 hours: Vitals:   09/05/16 2121 09/06/16 0457 09/06/16 1551 09/06/16 2245  BP: 115/69 120/63 121/65 130/63  Pulse: 82 74 87 72  Resp: 19 18 18 19   Temp: 97.8 F (36.6 C) (!) 97.5 F (36.4 C) 98.4 F (36.9 C) 98.3 F (36.8 C)  TempSrc: Oral Oral Oral Oral  SpO2: 93% 94% 94% 92%  Weight:  154 lb 1.6 oz (69.9 kg)    Height:       Physical Exam  Constitutional: She is oriented to person, place, and time. She appears well-developed and well-nourished.  HENT:  Head: Normocephalic and atraumatic.  Eyes: EOM are normal. No scleral icterus.  Cardiovascular: Normal rate, regular rhythm and normal heart sounds.   Pulmonary/Chest: Effort normal. No respiratory distress.  Bilateral rales in lower lung fields  Abdominal: Soft. Bowel sounds are normal. She exhibits no distension. There is no tenderness.  Musculoskeletal: She exhibits no tenderness.  RA associated joint deformity bilateral hands and feet  Neurological: She is alert and oriented to person, place, and time.  Skin: Skin is warm and dry.    Assessment/Plan:  Hypoxemia Dyspnea on Exertion Abnormalities on CTA Chest The differential remains open with possible hypersensitivity pneumonitis, immune reconstitution, cryptogenic organizing pneumonia, or rheumatoid arthritis associated interstitial lung disease. Atypical pulmonary infection  and severe fibrotic interstitial disease are less likely due to her response to high dose steroids. Any of these others would be expected to improve on steroids. - Pulmonology consultation, appreciate recommendations  - Bronchoscopy / BAL Deferred  - Follow autommune panel: ANA (pending), RF (neg), CRP (2.1 on 7/5), sed rate (47 on 7/5)  - CCP (neg)  - Hypersensitivity panel (pending)  - Beta D glucan (pending) - Responding to steroids, transition to PO on discharge - 2D Echo: Mild LVH, EF: 60-65% - Duonebs - Albuterol PRN  RA: previously on MTX.  Recently discontinued by her rheumatologist and plans to start on leflunomide - follow up with outpatient rheumatology - continue folic acid  DM: on metformin 500mg  BID - A1c - 7.2 - Sliding scale with HS coverage - Monitor Blood Glucose on Steroids   HTN: on amlodipine-benazepril 5-20mg  daily and Coreg 6.25mg  BID - BP remains low-normal here  - hold CCB/ACE combination - continue beta blocker  HLD: on atorvastatin 10mg  daily - Continue home med  Anxiety: on clorazepate 7.5mg  BID as needed - continue home med  Hypothyroidism: on Synthroid 75mcg daily - continue home medication  Gout: on allopurinol 100mg  BID  Osteoporosis: on vitamin D and alendronate  DVT PPx: Lovenox 40  F/E/N: Carb Modified  Dispo: Anticipated discharge later Today.   Ginette OttoAlec Melvin, DO IM PGY-1 Pager: [phone]

## 2016-09-07 NOTE — Progress Notes (Signed)
  Date: 09/07/2016  Patient name: Mary [name]  Medical record number: [id number]  Date of birth: 04/05/1940   I have seen and evaluated this patient and I have discussed the plan of care with the house staff. Please see their note for complete details. I concur with their findings with the following additions/corrections: Ms. Mary [name] was seen on morning rounds. She has no complaints and other than being hungry, is having no side effects to the steroids. Her glucose has been elevated though on the high-dose steroids. Her hypoxia has resolved on the steroids and we have not started [name]. Pulmonology does not plan to proceed with an inpatient bronchoscopy but has arranged for outpatient pulmonology follow-up. Patient is stable for DC'd at home on [name] steroid taper and [name] more aggressive diabetic medication regimen.  Mary [name], Mary Remus A, MD 09/07/2016, 3:48 PM

## 2016-09-07 NOTE — Progress Notes (Signed)
Name: Mary MeierGaynelle [name] MRN: [id number] DOB: 08-03-1940    ADMISSION DATE:  09/03/2016 CONSULTATION DATE:  09/03/2016  REFERRING MD :  Dr. Rogelia BogaButcher  CHIEF COMPLAINT:  Palpitations  HISTORY OF PRESENT ILLNESS:  76 year old female with PMH as below, which is significant for DM, seronegative RA, gout, HTN, HLD, and hypothyroidism. Recently diagnosed with UTI as well. She has been treated for RA with leflunomide for quite some time and was in the process of being switched to leflunomide for unclear reasons. She has, however, not taken her first dose of this yet. [facility] complaints of palpitations and anxiety, which did not resolve with a "nerve pill" as they typically do. Upon arrival to the ED she was found to be hypoxemic with O2 sats in the 80s. She was given supplemental oxygen and sats improved to the low 90s. She went for a CTA of the chest, which was negative for PE, but did identify bilateral GGO. She was admitted to the Internal Medicine Teaching Service and PCCM was asked to see in consultation.   Upon my evaluation patient reports that her SOB has gradually progressed over a period of weeks. Report chronic sinus congestion and GERD that go back some time, but the dyspnea is new and worse on exertion. She denies associated cough, mucous production, nausea, vomiting. She denies smoking ever, drinking, illegal drug use. She did work in a [employer] for several years about 12-15 years ago. She has not been around birds other than that. No other unusual animal exposures.   SIGNIFICANT EVENTS  7/5 admit with hypoxia.  STUDIES:  CTA chest 7/5 > Negative for pulmonary embolus. Extensive bilateral ground-glass opacities are nonspecific and could be due to pulmonary edema in this patient with cardiomegaly, ARDS or hypersensitivity pneumonitis among other entities. Bilateral hilar and mediastinal lymph nodes as described above are likely reactive. Calcific aortic and coronary  atherosclerosis. Hiatal hernia. Echo 7/9 > EF 60-65%.  I have reviewed the images personally.  SUBJECTIVE:  No acute events.  Much better over the weekend.  No longer requiring supplemental O2.  Per daughter, pt appears a lot better.  VITAL SIGNS: Temp:  [98.3 F (36.8 C)-98.4 F (36.9 C)] 98.3 F (36.8 C) (07/08 2245) Pulse Rate:  [72-87] 72 (07/08 2245) Resp:  [18-19] 19 (07/08 2245) BP: (121-130)/(63-65) 130/63 (07/08 2245) SpO2:  [92 %-94 %] 92 % (07/08 2245)  PHYSICAL EXAMINATION: Gen:      No acute distress, daughter at bedside. HEENT:  EOMI, sclera anicteric Neck:     No masses; no thyromegaly Lungs:    Normal respiratory effort.  CTAB CV:         Regular rate and rhythm; no murmurs Abd:      + bowel sounds; soft, non-tender; no palpable masses, no distension Ext:    No edema; adequate peripheral perfusion, ulnar deviation of fingers Skin:      Warm and dry; no rash Neuro:   Alert and oriented x 3 Psych:  Normal mood and affect   Recent Labs Lab 09/05/16 0255 09/06/16 0439 09/07/16 0335  NA 133* 135 137  K 5.1 4.9 4.9  CL 107 107 107  CO2 18* 19* 21*  BUN 28* 28* 28*  CREATININE 0.84 0.69 0.65  GLUCOSE 296* 277* 245*    Recent Labs Lab 09/03/16 0509 09/04/16 0504 09/07/16 0335  HGB 11.4* 10.8* 11.1*  HCT 35.9* 35.4* 35.0*  WBC 7.5 4.0 6.7  PLT 412*  410* 412*   No results found.  ASSESSMENT / PLAN: Acute hypoxemic respiratory failure with diffuse bilateral pulmonary infiltrates - responded well to steroids and now off O2. Has history of rheumatoid arthritis and was recently on methotrexate.  DDx is broad including RA-ILD, volume overload, viral infection. Doubt methotrexate lung toxicity as symptoms developed after it was stopped. She could have an oppurtunistic infection but does not appear infected at present and this is low likelihood. PCT reassuring.  She has responded well to steroids and try diuresis (-3L).  BAL was being considered if she had  no improvement on steroids; however, for now best plan would be to continue steroids and defer BAL.  - Continue steroids, transition to oral with gradual wean. - Follow autommune panel (ANA, RF, CRP, sed rate). Add CCP, hypersensitivity panel, beta D glucan. - Defer bronch / BAL.   Rest per primary team.   Rutherford Guys, PA - [facility] Pager: [phone]  or [phone] 09/07/2016, 7:24 AM

## 2016-09-07 NOTE — Progress Notes (Addendum)
Occupational Therapy Treatment Patient Details Name: Mary MeierGaynelle Wanzer MRN: [id number] DOB: Jul 19, 1940 Today's Date: 09/07/2016    History of present illness Pt is a 76 y/o female admitted secondary to hypoxia. CT angio of the chest negative for PE. PMH includes RA, gout, DM, and HTN.    OT comments  Completed ADL seated at sink with set up to max assist. Pt educated in multiple uses of 3 in 1. Pt eager to go home today with her husband's assist.  Follow Up Recommendations  No OT follow up;Supervision/Assistance - 24 hour    Equipment Recommendations  3 in 1 bedside commode    Recommendations for Other Services      Precautions / Restrictions Precautions Precautions: Fall Restrictions Weight Bearing Restrictions: No       Mobility Bed Mobility Overal bed mobility: Needs Assistance Bed Mobility: Supine to Sit     Supine to sit: Supervision     General bed mobility comments: no physical assist, HOB up  Transfers Overall transfer level: Needs assistance Equipment used: Rolling walker (2 wheeled) Transfers: Sit to/from Stand Sit to Stand: Min assist         General transfer comment: cues for hand placement, assist to steady, used momentum from bed    Balance Overall balance assessment: Needs assistance   Sitting balance-Leahy Scale: Fair     Standing balance support: Bilateral upper extremity supported;During functional activity Standing balance-Leahy Scale: Poor                             ADL either performed or assessed with clinical judgement   ADL Overall ADL's : Needs assistance/impaired     Grooming: Set up;Sitting;Wash/dry face;Wash/dry hands;Brushing hair   Upper Body Bathing: Minimal assistance;Sitting Upper Body Bathing Details (indicate cue type and reason): assisted with back only Lower Body Bathing: Maximal assistance;Sit to/from stand Lower Body Bathing Details (indicate cue type and reason): pt is typically dependent Upper Body  Dressing : Set up;Sitting   Lower Body Dressing: Set up;Sit to/from stand Lower Body Dressing Details (indicate cue type and reason): slip on shoes Toilet Transfer: Min guard;Ambulation;BSC;RW   Toileting- Clothing Manipulation and Hygiene: Minimal assistance;Sit to/from Nurse, children'sstand     Tub/Shower Transfer Details (indicate cue type and reason): educated in use of 3 in 1 as shower seat Functional mobility during ADLs: Min guard;Rolling walker General ADL Comments: pt sat at sink for bathing and dressing     Vision       Perception     Praxis      Cognition Arousal/Alertness: Awake/alert Behavior During Therapy: WFL for tasks assessed/performed Overall Cognitive Status: Within Functional Limits for tasks assessed                                          Exercises     Shoulder Instructions       General Comments      Pertinent Vitals/ Pain       Pain Assessment: Faces Faces Pain Scale: Hurts little more Pain Location: between toes with bathing Pain Descriptors / Indicators: Sore;Grimacing Pain Intervention(s): Monitored during session;Repositioned  Home Living Family/patient expects to be discharged to:: Private residence  Prior Functioning/Environment              Frequency  Min 2X/week        Progress Toward Goals  OT Goals(current goals can now be found in the care plan section)  Progress towards OT goals: Progressing toward goals  Acute Rehab OT Goals Patient Stated Goal: go home today OT Goal Formulation: With patient Time For Goal Achievement: 09/18/16 Potential to Achieve Goals: Good  Plan Discharge plan remains appropriate    Co-evaluation                 AM-PAC PT "6 Clicks" Daily Activity     Outcome Measure   Help from another person eating meals?: A Little Help from another person taking care of personal grooming?: A Little Help from another person  toileting, which includes using toliet, bedpan, or urinal?: A Little Help from another person bathing (including washing, rinsing, drying)?: A Lot Help from another person to put on and taking off regular upper body clothing?: None Help from another person to put on and taking off regular lower body clothing?: A Little 6 Click Score: 18    End of Session Equipment Utilized During Treatment: Rolling walker;Gait belt  OT Visit Diagnosis: Muscle weakness (generalized) (M62.81)   Activity Tolerance Patient tolerated treatment well   Patient Left in chair;with call bell/phone within reach;with family/visitor present   Nurse Communication Other (comment) (bathing completed)        Time:  0937- 10:37    Charges: OT General Charges $OT Visit: 1 Procedure OT Treatments $Self Care/Home Management : 2 self care    Evern Bio 09/07/2016, 11:01 AM  [phone]

## 2016-09-08 LAB — FANA STAINING PATTERNS: Homogeneous Pattern: 1:80 {titer}

## 2016-09-08 LAB — ANTINUCLEAR ANTIBODIES, IFA: ANA Ab, IFA: POSITIVE — AB

## 2016-09-09 LAB — HYPERSENSITIVITY PNEUMONITIS
A. FUMIGATUS #1 ABS: NEGATIVE
A. Pullulans Abs: NEGATIVE
MICROPOLYSPORA FAENI IGG: NEGATIVE
PIGEON SERUM ABS: NEGATIVE
THERMOACTINOMYCES VULGARIS IGG: NEGATIVE
Thermoact. Saccharii: NEGATIVE

## 2016-09-10 ENCOUNTER — Telehealth: Payer: Self-pay | Admitting: Pharmacist

## 2016-09-10 LAB — MISC LABCORP TEST (SEND OUT): LABCORP TEST CODE: 284526

## 2016-09-10 NOTE — Telephone Encounter (Signed)
I think the concern was for rheumatoid arthritis causing ground glass opacities on CT chest rather than leflunomide.  I think it would be okay for her to resume leflunomide and monitor.  She has f/u with our APP Kandice RobinsonsSarah Groce, and then Dr. Isaiah SergeMannam.

## 2016-09-10 NOTE — Telephone Encounter (Signed)
Please let us know if he can start this patient on Arava. Is there any concern about hypersensitivity pneumonitis and use of Arava ?

## 2016-09-10 NOTE — Telephone Encounter (Signed)
Plan per 08/24/16 rheumatology visit was to switch patient from methotrexate to leflunomide.  Methotrexate was d/c and we were waiting for patient's UTI to clear before starting leflunomide.    I spoke to patient.  She confirms she completed antibiotic course.  Urinary symptoms and fever have resolved.  Patient was hospitalized for hypoxia from 09/03/16 to 09/07/16.  She had a urinalysis on 09/03/16 which was negative.  She is on prednisone 30 mg daily per pulmonology.    Discharge summary: "1. Hypoxia: Mrs. Osuna presented following a episode of anxiety and rapid heart rate that did not resolve with her clorazepate. During evaluation in the ED, she was found to be hypoxic on room air with exertion to 85%. Heart rate was normal. A CTA chest was ordered to evaluate for PE.This study was negative for PE but did note extensive bilateral ground-glass opacities that were nonspecific. CXR showed "Vascular congestion and borderline cardiomegaly. Hazy interstitial prominence raises concern for pulmonary edema, more prominent on the left". Further questioning revealed SOB with exertion and several months of increasing DOE. Of note, she recently stopped MTX to switch to leflunomide, but had not started her new medication because she had a UTI.  BNP was normal and 2d Echo showed EF 60-65%. She received Duonebs, and Albuterol PRN. Pulmonology was consulted and recommended starting IV steroids and obtaing autoimmune Panel [ESR (47) CRP (2.1)  RF (neg) ANA (positive)], CCP (neg), hypersensitivity panel (pending), beta D Glucan (completed but no result listed), respiratory panel (negative for all) and PCT (neg). She responded well to several days of IV steroids and repeat CXR showed "Overall improved appearance of the lung supporting other edema or an acute inflammatory process." Pulmonology Reccommended discharging on 63m Prednisone Daily with follow-up in their clinic on 09/24/16.  DDx remains broad upon discharge and  includes RA-ILD, hypersensitivity pneumonitis, immune reconstitution, and viral infection (unlikely with negative viral panel). Methotrexate lung toxicity is considered to be less like as her symptoms developed after stopping the medication."    Do you want to proceed with starting leflunomide?

## 2016-09-11 NOTE — Telephone Encounter (Signed)
Attempted to contact patient and unable to leave message.

## 2016-09-11 NOTE — Telephone Encounter (Signed)
Okay to start patient on [place]. Please give her Arava 10 mg by mouth daily for 2 weeks and then increase it to 20 mg a day if the labs are normal. She will need a standing orders every 2 weeks 2 and then every 2 months.

## 2016-09-15 ENCOUNTER — Telehealth: Payer: Self-pay

## 2016-09-15 MED ORDER — LEFLUNOMIDE 10 MG PO TABS: ORAL_TABLET | ORAL | 0 refills | Status: DC

## 2016-09-15 NOTE — Telephone Encounter (Signed)
A prior authorization for Mary [name] has been submitted to patient's insurance via cover my meds. Will update once we receive a response.   Devri Kreher, [place], CPhT 4:20 PM

## 2016-09-15 NOTE — Telephone Encounter (Signed)
Patient advised prescription sent to the pharmacy and reviewed lab schedule. Patient verbalized understanding.

## 2016-09-17 NOTE — Telephone Encounter (Signed)
Received a confirmation from Cover my meds regarding a prior authorization approval for Arava from 09/17/2016 to 10/18/2016.   Reference [id number]:[id number] Phone number:[phone]   Will send document to scan center.  Spoke to patient to give her the update. Patient voices understanding and denies any questions at this time.  Morty Ortwein, [place], CPhT 4:10 PM

## 2016-09-24 ENCOUNTER — Encounter: Payer: Self-pay | Admitting: Acute Care

## 2016-09-24 ENCOUNTER — Ambulatory Visit (INDEPENDENT_AMBULATORY_CARE_PROVIDER_SITE_OTHER): Payer: Medicare HMO | Admitting: Acute Care

## 2016-09-24 DIAGNOSIS — M0510 Rheumatoid lung disease with rheumatoid arthritis of unspecified site: Secondary | ICD-10-CM

## 2016-09-24 DIAGNOSIS — R0902 Hypoxemia: Secondary | ICD-10-CM

## 2016-09-24 NOTE — Assessment & Plan Note (Signed)
Significantly improved on prednisone/steroids Recently started on Arava by Dr. Titus Dubinevashwar Plan You appear to be doing well since hospital discharge. We will decrease your prednisone to 20 mg daily. Follow up with Dr. Isaiah SergeMannam in 2 weeks or first available. Follow up labs per Dr. Titus Dubinevashwar in 2 weeks as ordered. Please contact office for sooner follow up if symptoms do not improve or worsen or seek emergency care   At follow-up consider pulmonary function tests At follow-up consider follow-up imaging At follow-up continue tapering prednisone

## 2016-09-24 NOTE — Assessment & Plan Note (Addendum)
Resolved Patient denies any dyspnea Oxygen saturations after ambulation in the office today were 94%.

## 2016-09-24 NOTE — Progress Notes (Signed)
History of Present Illness Mary [name] is a 76 y.o. female with RA  and   09/24/2016 Hospital Follow Up: Patient presents for hospital follow-up she was hospitalized July 5 through  09/07/2016. She presented to the emergency room following an episode of anxiety and rapid heart rate that did not resolve with her clorazepate. In the ED she was found to be hypoxic on room air with saturation of 85%. Heart rate was normal. CTA was negative for PE, but did note extensive bilateral groundglass opacities that were nonspecific. Chest x-ray at that time showed vascular congestion and borderline cardiomegaly. Hazy interstitial prominence is raised concern for pulmonary edema left greater than right. Upon further questioning the patient revealed that she has been having shortness of breath with exertion for the past several months with increased dyspnea on exertion. Of note she has recently switched from methotrexate to leflunomide, but had not started her new medication due to UTI. BNP was normal, and 2-D echo indicated EF of 60-65%. She was treated with DuoNeb's and albuterol, IV steroids were initiated after pulmonary consult. She responded well to several days of IV steroids, and diuresis (-3 L) and repeat chest x-ray indicated overall improved appearance of lung supporting other edema or acute inflammatory process. She was discharged home on 30 mg of prednisone daily. She presents to the pulmonary office today for follow-up. Patient states she is Doing well. No breathing issues. She is compliant with her prednisone at 30 mg daily. She is hungry on the prednisone She has no complaints of dyspnea. She states since she has come home her dyspnea is better.She was taken off methotrexate and started on [place] by Dr. Patrecia Pour.She will have labs in 2 weeks and then may increase dose from 10-20 mg daily if labs are stable.She denies chest pain, fever, orthopnea or hemoptysis. She recently had foot surgery to her right  bunyan area . She is wearing a boot, and ambulating with assistance of a cane. We did discuss fall precautions  STUDIES:  CTA chest 7/5 > Negative for pulmonary embolus. Extensive bilateral ground-glass opacities are nonspecific and could be due to pulmonary edema in this patient with cardiomegaly, ARDS or hypersensitivity pneumonitis among other entities. Bilateral hilar and mediastinal lymph nodes as described above are likely reactive. Calcific aortic and coronary atherosclerosis. Hiatal hernia. Echo 7/9 > EF 60-65%.   Autoimmune Panel [ESR (47) CRP (2.1)  RF (neg) ANA (positive)], CCP (neg), hypersensitivity panel (negative), beta D Glucan (completed but no result listed), respiratory panel (negative for all) and PCT (neg).  CRP>> 2.1  CBC Latest Ref Rng & Units 09/07/2016 09/04/2016 09/03/2016  WBC 4.0 - 10.5 K/uL 6.7 4.0 7.5  Hemoglobin 12.0 - 15.0 g/dL 11.1(L) 10.8(L) 11.4(L)  Hematocrit 36.0 - 46.0 % 35.0(L) 35.4(L) 35.9(L)  Platelets 150 - 400 K/uL 412(H) 410(H) 412(H)    BMP Latest Ref Rng & Units 09/07/2016 09/06/2016 09/05/2016  Glucose 65 - 99 mg/dL 245(H) 277(H) 296(H)  BUN 6 - 20 mg/dL 28(H) 28(H) 28(H)  Creatinine 0.44 - 1.00 mg/dL 0.65 0.69 0.84  BUN/Creat Ratio 12 - 28 - - -  Sodium 135 - 145 mmol/L 137 135 133(L)  Potassium 3.5 - 5.1 mmol/L 4.9 4.9 5.1  Chloride 101 - 111 mmol/L 107 107 107  CO2 22 - 32 mmol/L 21(L) 19(L) 18(L)  Calcium 8.9 - 10.3 mg/dL 9.2 9.2 9.3    BNP    Component Value Date/Time   BNP 14.6 09/03/2016 0509     Dg  Chest 2 View  Result Date: 09/07/2016 CLINICAL DATA:  Dyspnea EXAM: CHEST  2 VIEW COMPARISON:  09/03/2016 FINDINGS: Hazy opacity and increased interstitial markings throughout both lungs have improved. The heart remains mildly enlarged. The mediastinum remains prominent. No pneumothorax. No pleural effusion. Osteopenia. IMPRESSION: Overall improved appearance of the lung supporting other edema or an acute inflammatory process. Electronically  Signed   By: Marybelle Killings M.D.   On: 09/07/2016 07:43   Dg Chest 2 View  Result Date: 09/03/2016 CLINICAL DATA:  Acute onset of shortness of breath and decreased O2 saturation. Tachycardia. Initial encounter. EXAM: CHEST  2 VIEW COMPARISON:  None. FINDINGS: The lungs are well-aerated. Vascular congestion is noted. Hazy interstitial prominence raises concern for pulmonary edema, more prominent on the left. There is no evidence of pleural effusion or pneumothorax. The heart is borderline enlarged. No acute osseous abnormalities are seen. IMPRESSION: Vascular congestion and borderline cardiomegaly. Hazy interstitial prominence raises concern for pulmonary edema, more prominent on the left. Electronically Signed   By: Garald Balding M.D.   On: 09/03/2016 05:51   Ct Angio Chest Pe W/cm &/or Wo Cm  Result Date: 09/03/2016 CLINICAL DATA:  Recent onset of shortness of breath and exertional hypoxia. EXAM: CT ANGIOGRAPHY CHEST WITH CONTRAST TECHNIQUE: Multidetector CT imaging of the chest was performed using the standard protocol during bolus administration of intravenous contrast. Multiplanar CT image reconstructions and MIPs were obtained to evaluate the vascular anatomy. CONTRAST:  55 cc Isovue 370. COMPARISON:  Single-view of the chest this same day. FINDINGS: Cardiovascular: No pulmonary embolus is identified. Calcific aortic and coronary atherosclerosis is seen. There is cardiomegaly. No pericardial effusion. Mediastinum/Nodes: The patient has a small to moderate hiatal hernia. Thyroid gland appears normal. Right hilar lymph node on image 53 measures 2 cm short axis dimension. Left hilar lymph node on image 53 measures 1.1 cm short axis dimension. Subcarinal lymph node on image 61 measures 1.3 cm short axis dimension. Lungs/Pleura: No pleural effusion. Extensive bilateral ground-glass opacities are seen. No nodule or mass. Upper Abdomen: Negative. Musculoskeletal: No lytic or sclerotic lesion. Review of the MIP  images confirms the above findings. IMPRESSION: Negative for pulmonary embolus. Extensive bilateral ground-glass opacities are nonspecific and could be due to pulmonary edema in this patient with cardiomegaly, ARDS or hypersensitivity pneumonitis among other entities. Bilateral hilar and mediastinal lymph nodes as described above are likely reactive. Calcific aortic and coronary atherosclerosis. Hiatal hernia. Electronically Signed   By: Inge Rise M.D.   On: 09/03/2016 07:51     Past medical hx Past Medical History:  Diagnosis Date  . Complication of anesthesia   . Diabetes mellitus without complication ([place])   . Dyspnea   . Gout   . High cholesterol   . History of kidney stones   . Hypertension   . Hypoxia 08/2016  . Osteoarthritis   . PONV (postoperative nausea and vomiting)   . Rheumatoid arthritis [facility])      Social History  Substance Use Topics  . Smoking status: Never Smoker  . Smokeless tobacco: Never Used  . Alcohol use No    Tobacco Cessation: Patient is a never smoker Past surgical hx, Family hx, Social hx all reviewed.  Current Outpatient Prescriptions on File Prior to Visit  Medication Sig  . alendronate (FOSAMAX) 70 MG tablet Take 1 tablet (70 mg total) by mouth once a week. Take with a full glass of water on an empty stomach. (Patient taking differently: Take 70 mg by mouth  3 (three) times a week. Monday, Wednesday and  Friday  Take with a full glass of water on an empty stomach.)  . allopurinol (ZYLOPRIM) 100 MG tablet Take 100 mg by mouth 2 (two) times daily.   Marland Kitchen atorvastatin (LIPITOR) 10 MG tablet Take 10 mg by mouth daily.  . benazepril (LOTENSIN) 20 MG tablet Take 1 tablet (20 mg total) by mouth daily.  . carvedilol (COREG) 6.25 MG tablet Take 6.25 mg by mouth 2 (two) times daily with a meal.   . Cholecalciferol (VITAMIN D3) 5000 units TABS Take 1 tablet by mouth. Takes on Monday, Wednesday, and Friday.  . clorazepate (TRANXENE) 7.5 MG tablet Take 7.5  mg by mouth 2 (two) times daily.   . folic acid (FOLVITE) 1 MG tablet Take 2 tablets (2 mg total) by mouth daily.  Marland Kitchen glipiZIDE (GLUCOTROL) 5 MG tablet Take 1 tablet (5 mg total) by mouth daily before breakfast.  . leflunomide (ARAVA) 10 MG tablet Arava 10 mg by mouth daily for 2 weeks and then increase it to 20 mg a day if the labs are normal  . levothyroxine (SYNTHROID, LEVOTHROID) 75 MCG tablet Take 75 mcg by mouth daily.   . metFORMIN (GLUCOPHAGE) 500 MG tablet Take 500 mg by mouth 2 (two) times daily with a meal.   . predniSONE (DELTASONE) 10 MG tablet Take 3 tablets (30 mg total) by mouth daily with breakfast.   No current facility-administered medications on file prior to visit.      Allergies  Allergen Reactions  . Aspirin Other (See Comments)    Itching per pt  . Codeine Nausea Only    Review Of Systems:  Constitutional:   No  weight loss, night sweats,  Fevers, chills, fatigue, or  lassitude.  HEENT:   No headaches,  Difficulty swallowing,  Tooth/dental problems, or  Sore throat,                No sneezing, itching, ear ache, nasal congestion, post nasal drip,   CV:  No chest pain,  Orthopnea, PND, swelling in lower extremities, anasarca, dizziness, palpitations, syncope.   GI  No heartburn, indigestion, abdominal pain, nausea, vomiting, diarrhea, change in bowel habits, loss of appetite, bloody stools.   Resp: No shortness of breath with exertion or at rest.  No excess mucus, no productive cough,  No non-productive cough,  No coughing up of blood.  No change in color of mucus.  No wheezing.  No chest wall deformity  Skin: no rash or lesions.  GU: no dysuria, change in color of urine, no urgency or frequency.  No flank pain, no hematuria   MS:  No joint pain or swelling.  + decreased range of motion.  No back pain, right foot in a boot, postop bunion surgery.  Psych:  No change in mood or affect. No depression or anxiety.  No memory loss.   Vital Signs BP 124/72 (BP  Location: Left Arm, Cuff Size: Normal)   Pulse 74   Ht _0  (1.6 m)   Wt 156 lb (70.8 kg)   SpO2 94%   BMI 27.63 kg/m    Physical Exam:  General- No distress,  A&Ox3, very pleasant ENT: No sinus tenderness, TM clear, pale nasal mucosa, no oral exudate,no post nasal drip, no LAN Cardiac: S1, S2, regular rate and rhythm, no murmur Chest: No wheeze/ rales/ dullness; no accessory muscle use, no nasal flaring, no sternal retractions Abd.: Soft Non-tender, nondistended, bowel sounds positive, obese Ext:  No clubbing cyanosis, edema Neuro:  normal strength, walks with a walker, cranial nerves intact Skin: No rashes, warm and dry Psych: normal mood and behavior   Assessment/Plan  Rheumatoid lung (HCC) Significantly improved on prednisone/steroids Recently started on Arava by Dr. Patrecia Pour Plan You appear to be doing well since hospital discharge. We will decrease your prednisone to 20 mg daily. Follow up with Dr. Vaughan Browner in 2 weeks or first available. Follow up labs per Dr. Patrecia Pour in 2 weeks as ordered. Please contact office for sooner follow up if symptoms do not improve or worsen or seek emergency care   At follow-up consider pulmonary function tests At follow-up consider follow-up imaging At follow-up continue tapering prednisone   Hypoxia Resolved Patient denies any dyspnea Oxygen saturations after ambulation in the office today were 94%.    Magdalen Spatz, NP 09/24/2016  4:18 PM

## 2016-09-24 NOTE — Patient Instructions (Addendum)
It is nice to meet you today. You appear to be doing well since hospital discharge. We will decrease your prednisone to 20 mg daily. Follow up with Dr. Isaiah SergeMannam in 2 weeks or first available. Follow up labs per Dr. Titus Dubinevashwar in 2 weeks as ordered. Please contact office for sooner follow up if symptoms do not improve or worsen or seek emergency care   At follow-up consider pulmonary function tests At follow-up consider follow-up imaging At follow-up continue tapering prednisone

## 2016-09-29 ENCOUNTER — Telehealth: Payer: Self-pay | Admitting: Rheumatology

## 2016-09-29 NOTE — Telephone Encounter (Signed)
Patient advised to contact the pulmonologist. Patient verbalized understanding.

## 2016-09-29 NOTE — Telephone Encounter (Signed)
She should contact her pulmonologist

## 2016-09-29 NOTE — Telephone Encounter (Signed)
Patient would like to know when she is due for labs? Patient has enough Prednisone for two days. Patient uses [facility] [place].

## 2016-09-29 NOTE — Telephone Encounter (Signed)
Patient advised she is due for labs now. Patient was started on Arava 2 weeks ago. Patient will come tomorrow for labs,.  Patient states she was in the hospital in early July for issues with her breathing. Patient states the doctor at the hospital gave her prednisone after talking with you. She would like to know if you would be the one to continue the prednisone or if she should contact the pulmonologist?

## 2016-09-30 ENCOUNTER — Telehealth: Payer: Self-pay | Admitting: Pulmonary Disease

## 2016-09-30 ENCOUNTER — Other Ambulatory Visit: Payer: Self-pay

## 2016-09-30 DIAGNOSIS — Z79899 Other long term (current) drug therapy: Secondary | ICD-10-CM

## 2016-09-30 LAB — CBC WITH DIFFERENTIAL/PLATELET
BASOS ABS: 0 {cells}/uL (ref 0–200)
Basophils Relative: 0 %
EOS PCT: 1 %
Eosinophils Absolute: 101 cells/uL (ref 15–500)
HEMATOCRIT: 37.1 % (ref 35.0–45.0)
HEMOGLOBIN: 11.6 g/dL — AB (ref 11.7–15.5)
LYMPHS ABS: 3232 {cells}/uL (ref 850–3900)
Lymphocytes Relative: 32 %
MCH: 29.1 pg (ref 27.0–33.0)
MCHC: 31.3 g/dL — AB (ref 32.0–36.0)
MCV: 93 fL (ref 80.0–100.0)
MONO ABS: 404 {cells}/uL (ref 200–950)
MPV: 11.9 fL (ref 7.5–12.5)
Monocytes Relative: 4 %
NEUTROS ABS: 6363 {cells}/uL (ref 1500–7800)
NEUTROS PCT: 63 %
Platelets: 197 10*3/uL (ref 140–400)
RBC: 3.99 MIL/uL (ref 3.80–5.10)
RDW: 18.9 % — ABNORMAL HIGH (ref 11.0–15.0)
WBC: 10.1 10*3/uL (ref 3.8–10.8)

## 2016-09-30 MED ORDER — PREDNISONE 10 MG PO TABS: ORAL_TABLET | ORAL | 0 refills | Status: DC

## 2016-09-30 NOTE — Telephone Encounter (Signed)
Called and spoke to Mary [name] with [facility] and was advised pt is requesting a refill of pred 10 mg so she can take 2 tabs per day. At pt's last OV SG advised pt to decrease pred to 20mg  tabs. Spoke with Maralyn SagoSarah and was advised we can fill this time as courtesy for 21 days but she will need to f/u with rheum after for further refills, this has been mentioned in the refill. Nothing further needed.

## 2016-10-01 ENCOUNTER — Telehealth: Payer: Self-pay | Admitting: Radiology

## 2016-10-01 LAB — COMPLETE METABOLIC PANEL WITH GFR
ALBUMIN: 3.8 g/dL (ref 3.6–5.1)
ALK PHOS: 36 U/L (ref 33–130)
ALT: 13 U/L (ref 6–29)
AST: 11 U/L (ref 10–35)
BILIRUBIN TOTAL: 0.5 mg/dL (ref 0.2–1.2)
BUN: 29 mg/dL — AB (ref 7–25)
CALCIUM: 8.7 mg/dL (ref 8.6–10.4)
CO2: 16 mmol/L — ABNORMAL LOW (ref 20–31)
Chloride: 109 mmol/L (ref 98–110)
Creat: 0.72 mg/dL (ref 0.60–0.93)
GFR, EST NON AFRICAN AMERICAN: 83 mL/min (ref >=60)
GFR, Est African American: >89 mL/min (ref >=60)
Glucose, Bld: 121 mg/dL — ABNORMAL HIGH (ref 65–99)
POTASSIUM: 4.5 mmol/L (ref 3.5–5.3)
SODIUM: 142 mmol/L (ref 135–146)
TOTAL PROTEIN: 6.2 g/dL (ref 6.1–8.1)

## 2016-10-01 NOTE — Telephone Encounter (Signed)
I have called patient to advise labs are stable  

## 2016-10-01 NOTE — Progress Notes (Signed)
Labs are stable.

## 2016-10-01 NOTE — Telephone Encounter (Signed)
-----   Message from Shaili Deveshwar, MD sent at 10/01/2016  8:37 AM EDT ----- Labs are stable 

## 2016-10-05 ENCOUNTER — Other Ambulatory Visit: Payer: Self-pay | Admitting: Internal Medicine

## 2016-10-12 ENCOUNTER — Ambulatory Visit (INDEPENDENT_AMBULATORY_CARE_PROVIDER_SITE_OTHER): Payer: Medicare HMO | Admitting: Pulmonary Disease

## 2016-10-12 ENCOUNTER — Encounter: Payer: Self-pay | Admitting: Pulmonary Disease

## 2016-10-12 ENCOUNTER — Ambulatory Visit (INDEPENDENT_AMBULATORY_CARE_PROVIDER_SITE_OTHER)
Admission: RE | Admit: 2016-10-12 | Discharge: 2016-10-12 | Disposition: A | Discharge status: Home / Self Care | Payer: Medicare HMO | Source: Ambulatory Visit | Attending: Pulmonary Disease | Admitting: Pulmonary Disease

## 2016-10-12 VITALS — BP 124/76 | HR 85 | Ht 63.0 in | Wt 156.0 lb

## 2016-10-12 DIAGNOSIS — J849 Interstitial pulmonary disease, unspecified: Secondary | ICD-10-CM | POA: Diagnosis not present

## 2016-10-12 DIAGNOSIS — R0902 Hypoxemia: Secondary | ICD-10-CM

## 2016-10-12 DIAGNOSIS — M0510 Rheumatoid lung disease with rheumatoid arthritis of unspecified site: Secondary | ICD-10-CM | POA: Diagnosis not present

## 2016-10-12 NOTE — Progress Notes (Signed)
[place]    [id number]    12/26/1940  Primary Care Physician:York, Orie Routegina F, NP  Referring Physician: Dema SeverinYork, Regina F, NP [address] [place], [place] [id number]  Chief complaint:   Follow up for RA-ILD  HPI: 76 year old with history of rheumatoid arthritis, diabetes, gout, hypertension, hyperlipidemia and hypothyroidism. She is being followed by Dr. Corliss Skainseveshwar for management of rheumatoid arthritis. She had been previously maintained on methotrexate. She was taken off methotrexate with plans to transition to leflunomide. Before initiation of leflunomide she was hospitalized July 2018 with respiratory failure, bilateral lung infiltrates. CT at that time did not show any pulmonary embolus but there are diffuse ground glass opacities concerning for interstitial lung disease or edema.  She was started on prednisone and diuresed with improvement in symptoms. Bronchoscopy was considered but deferred due to improvement in respiratory status. She has been discharged on a prednisone taper and has followed up with Dr. Corliss Skainseveshwar and is currently on [place]. She was seen in the pulmonary clinic on 7/26 and prednisone reduced to 20 mg.  She returns to the clinic today for further follow-up. She feels well with no respiratory complaints. Denies any cough, sputum production, dyspnea, wheezing.  Outpatient Encounter Prescriptions as of 10/12/2016  Medication Sig  . alendronate (FOSAMAX) 70 MG tablet Take 1 tablet (70 mg total) by mouth once a week. Take with a full glass of water on an empty stomach. (Patient taking differently: Take 70 mg by mouth 3 (three) times a week. Monday, Wednesday and  Friday  Take with a full glass of water on an empty stomach.)  . allopurinol (ZYLOPRIM) 100 MG tablet Take 100 mg by mouth 2 (two) times daily.   Marland Kitchen. atorvastatin (LIPITOR) 10 MG tablet Take 10 mg by mouth daily.  . benazepril (LOTENSIN) 20 MG tablet Take 1 tablet (20 mg total) by mouth daily.  . carvedilol  (COREG) 6.25 MG tablet Take 6.25 mg by mouth 2 (two) times daily with a meal.   . Cholecalciferol (VITAMIN D3) 5000 units TABS Take 1 tablet by mouth. Takes on Monday, Wednesday, and Friday.  . clorazepate (TRANXENE) 7.5 MG tablet Take 7.5 mg by mouth 2 (two) times daily.   . folic acid (FOLVITE) 1 MG tablet Take 2 tablets (2 mg total) by mouth daily.  Marland Kitchen. leflunomide (ARAVA) 10 MG tablet Arava 10 mg by mouth daily for 2 weeks and then increase it to 20 mg a day if the labs are normal  . levothyroxine (SYNTHROID, LEVOTHROID) 75 MCG tablet Take 75 mcg by mouth daily.   . metFORMIN (GLUCOPHAGE) 500 MG tablet Take 500 mg by mouth 2 (two) times daily with a meal.   . predniSONE (DELTASONE) 10 MG tablet Take 2 tablets daily  . glipiZIDE (GLUCOTROL) 5 MG tablet Take 1 tablet (5 mg total) by mouth daily before breakfast.   No facility-administered encounter medications on file as of 10/12/2016.     Allergies as of 10/12/2016 - Review Complete 10/12/2016  Allergen Reaction Noted  . Aspirin Other (See Comments) 04/19/2015  . Codeine Nausea Only 04/19/2015    Past Medical History:  Diagnosis Date  . Complication of anesthesia   . Diabetes mellitus without complication (HCC)   . Dyspnea   . Gout   . High cholesterol   . History of kidney stones   . Hypertension   . Hypoxia 08/2016  . Osteoarthritis   . PONV (postoperative nausea and vomiting)   .  Rheumatoid arthritis [facility])     Past Surgical History:  Procedure Laterality Date  . ABDOMINAL HYSTERECTOMY      Family History  Problem Relation Age of Onset  . COPD Brother     Social History   Social History  . Marital status: Married    Spouse name: N/A  . Number of children: N/A  . Years of education: N/A   Occupational History  . Not on file.   Social History Main Topics  . Smoking status: Never Smoker  . Smokeless tobacco: Never Used  . Alcohol use No  . Drug use: No  . Sexual activity: Not on file   Other Topics Concern   . Not on file   Social History Narrative  . No narrative on file    Review of systems: Review of Systems  Constitutional: Negative for fever and chills.  HENT: Negative.   Eyes: Negative for blurred vision.  Respiratory: as per HPI  Cardiovascular: Negative for chest pain and palpitations.  Gastrointestinal: Negative for vomiting, diarrhea, blood per rectum. Genitourinary: Negative for dysuria, urgency, frequency and hematuria.  Musculoskeletal: Negative for myalgias, back pain and joint pain.  Skin: Negative for itching and rash.  Neurological: Negative for dizziness, tremors, focal weakness, seizures and loss of consciousness.  Endo/Heme/Allergies: Negative for environmental allergies.  Psychiatric/Behavioral: Negative for depression, suicidal ideas and hallucinations.  All other systems reviewed and are negative.  Physical Exam: Blood pressure 124/76, pulse 85, height 5\' 3"  (1.6 m), weight 156 lb (70.8 kg), SpO2 94 %. Gen:      No acute distress HEENT:  EOMI, sclera anicteric Neck:     No masses; no thyromegaly Lungs:    Clear to auscultation bilaterally; normal respiratory effort CV:         Regular rate and rhythm; no murmurs Abd:      + bowel sounds; soft, non-tender; no palpable masses, no distension Ext:    No edema; adequate peripheral perfusion, joint deformities of hand Skin:      Warm and dry; no rash Neuro: alert and oriented x 3 Psych: normal mood and affect  Data Reviewed: CTA chest 09/03/16- Negative for pulmonary embolus. Extensive bilateral ground-glass opacities, bilateral hilar and mediastinal lymph nodes. Calcific aortic and coronary atherosclerosis. Hiatal hernia.  Chest x-ray 09/07/16-improvement in interstitial opacities. 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 She is also on Arava for management of her rheumatoid arthritis. I'll  get another chest x-ray today to make sure we are on the right track and reduce her prednisone further to 10 mg. If she continues to be stable on this dose will taper further in 1-2 months. We will obtain pulmonary function test up get a baseline evaluation of lung function. She'll need a high-resolution CT at some point. I'll wait to order it when she is completely off prednisone.  Plan/Recommendations: - CXR - Reduce prednisone to 10 mg. - PFTs  Follow up in 1-2 months  Chilton Greathouse MD [facility] Pager [phone] 10/12/2016, 2:33 PM  CC: Dema Severin, NP  Pollyann Savoy MD

## 2016-10-12 NOTE — Patient Instructions (Addendum)
We will get CXR today. Based on the result we will call about reducing the prednisone dose. Return in 1-2 months with PFTs

## 2016-10-13 ENCOUNTER — Telehealth: Payer: Self-pay | Admitting: Pulmonary Disease

## 2016-10-13 ENCOUNTER — Other Ambulatory Visit: Payer: Self-pay | Admitting: Rheumatology

## 2016-10-13 ENCOUNTER — Encounter: Payer: Self-pay | Admitting: *Deleted

## 2016-10-13 NOTE — Telephone Encounter (Signed)
Spoke with pt daughter Mary [name]--returning call about results  Notes recorded by Chilton GreathouseMannam, Praveen, MD on 10/13/2016 at 11:40 AM EDT CXR shows improvement in opacities. Please call pt or her daughter and ask them to reduce prednisone to 10 mg/day  Results have been explained to patient, pt expressed understanding. Nothing further needed.

## 2016-10-14 NOTE — Telephone Encounter (Signed)
Labs due, needed 2 weeks after start of Leflunomide Called patient to advise. She states she will come in tomorrow.

## 2016-10-15 ENCOUNTER — Other Ambulatory Visit: Payer: Self-pay

## 2016-10-15 DIAGNOSIS — Z79899 Other long term (current) drug therapy: Secondary | ICD-10-CM

## 2016-10-15 LAB — CBC WITH DIFFERENTIAL/PLATELET
BASOS PCT: 0 %
Basophils Absolute: 0 cells/uL (ref 0–200)
EOS ABS: 89 {cells}/uL (ref 15–500)
Eosinophils Relative: 1 %
HEMATOCRIT: 39.7 % (ref 35.0–45.0)
HEMOGLOBIN: 12.7 g/dL (ref 11.7–15.5)
LYMPHS PCT: 33 %
Lymphs Abs: 2937 cells/uL (ref 850–3900)
MCH: 28.5 pg (ref 27.0–33.0)
MCHC: 32 g/dL (ref 32.0–36.0)
MCV: 89.2 fL (ref 80.0–100.0)
MONO ABS: 623 {cells}/uL (ref 200–950)
MPV: 11.8 fL (ref 7.5–12.5)
Monocytes Relative: 7 %
NEUTROS PCT: 59 %
Neutro Abs: 5251 cells/uL (ref 1500–7800)
Platelets: 217 10*3/uL (ref 140–400)
RBC: 4.45 MIL/uL (ref 3.80–5.10)
RDW: 19.1 % — AB (ref 11.0–15.0)
WBC: 8.9 10*3/uL (ref 3.8–10.8)

## 2016-10-16 ENCOUNTER — Other Ambulatory Visit: Payer: Self-pay | Admitting: Radiology

## 2016-10-16 LAB — COMPLETE METABOLIC PANEL WITH GFR
ALBUMIN: 4.1 g/dL (ref 3.6–5.1)
ALK PHOS: 40 U/L (ref 33–130)
ALT: 12 U/L (ref 6–29)
AST: 11 U/L (ref 10–35)
BILIRUBIN TOTAL: 0.5 mg/dL (ref 0.2–1.2)
BUN: 27 mg/dL — AB (ref 7–25)
CHLORIDE: 107 mmol/L (ref 98–110)
CO2: 17 mmol/L — ABNORMAL LOW (ref 20–32)
CREATININE: 0.68 mg/dL (ref 0.60–0.93)
Calcium: 9.3 mg/dL (ref 8.6–10.4)
GFR, Est Non African American: 86 mL/min (ref >=60)
Glucose, Bld: 154 mg/dL — ABNORMAL HIGH (ref 65–99)
Potassium: 4 mmol/L (ref 3.5–5.3)
Sodium: 140 mmol/L (ref 135–146)
Total Protein: 6.6 g/dL (ref 6.1–8.1)

## 2016-10-16 MED ORDER — LEFLUNOMIDE 20 MG PO TABS: 20.0000 mg | ORAL_TABLET | Freq: Every day | ORAL | 0 refills | Status: DC

## 2016-10-16 NOTE — Progress Notes (Signed)
Glu elevated. Rest are stable. Please fax the labs to her PCP.

## 2016-10-26 ENCOUNTER — Other Ambulatory Visit: Payer: Self-pay | Admitting: Internal Medicine

## 2016-10-27 ENCOUNTER — Other Ambulatory Visit: Payer: Self-pay | Admitting: Acute Care

## 2016-10-27 ENCOUNTER — Other Ambulatory Visit: Payer: Self-pay | Admitting: Internal Medicine

## 2016-10-29 ENCOUNTER — Other Ambulatory Visit: Payer: Self-pay

## 2016-10-30 ENCOUNTER — Telehealth: Payer: Self-pay | Admitting: Pulmonary Disease

## 2016-10-30 MED ORDER — PREDNISONE 10 MG PO TABS: ORAL_TABLET | ORAL | 0 refills | Status: DC

## 2016-10-30 NOTE — Telephone Encounter (Signed)
Spoke with OmnicomKayla. She stated that the patient was requesting a refill on her prednisone. Advised her that per the last refill, she needed to contact her PCP for future refills. She explained that she told the patient this, but she said that Dr. Isaiah SergeMannam said that he will continue to write her RX.   Reviewed her chart and saw where he had commented on her CXR and said that she could reduce prednisone to 10mg  per day. Raynald BlendGave Kayla a verbal on the phone based on PM's recs. Nothing else needed at time of call.

## 2016-10-30 NOTE — Telephone Encounter (Signed)
Called [facility] and they stated that they do not have a Mandy there and that no one called our office about this refill.  Will close this message at this time.

## 2016-11-19 DIAGNOSIS — M810 Age-related osteoporosis without current pathological fracture: Secondary | ICD-10-CM | POA: Insufficient documentation

## 2016-11-19 NOTE — Progress Notes (Deleted)
Office Visit Note  Patient: Mary [name]             Date of Birth: 1941/01/13           MRN: [id number]             PCP: Dema Severin, NP Referring: Dema Severin, NP Visit Date: 12/03/2016 Occupation: @    Subjective:  No chief complaint on file.   History of Present Illness: Mary [name] is a 76 y.o. female *** with severe erosive seronegative rheumatoid arthritis. Patient was initially seen by me in July 2017.  Activities of Daily Living:  Patient reports morning stiffness for *** {minute/hour:19697}.   Patient {ACTIONS;DENIES/REPORTS:21021675::"Denies"} nocturnal pain.  Difficulty dressing/grooming: {ACTIONS;DENIES/REPORTS:21021675::"Denies"} Difficulty climbing stairs: {ACTIONS;DENIES/REPORTS:21021675::"Denies"} Difficulty getting out of chair: {ACTIONS;DENIES/REPORTS:21021675::"Denies"} Difficulty using hands for taps, buttons, cutlery, and/or writing: {ACTIONS;DENIES/REPORTS:21021675::"Denies"}   No Rheumatology ROS completed.   PMFS History:  Patient Active Problem List   Diagnosis Date Noted  . Age-related osteoporosis without current pathological fracture 11/19/2016  . Acute respiratory failure (HCC)   . Rheumatoid lung (HCC)   . Hypoxia 09/03/2016  . Essential hypertension 09/03/2016  . Urinary tract infection 09/03/2016  . 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 High risk medication use 02/28/2016  . Idiopathic chronic gout of multiple sites without tophus 02/28/2016  . Primary osteoarthritis of both knees 02/28/2016  . Flexion contractures 02/28/2016  . History of hypothyroidism 02/28/2016  . History of diabetes mellitus 02/28/2016  . History of shingles 02/28/2016  . History of hyperlipidemia 02/28/2016    Past Medical History:  Diagnosis Date  . Complication of anesthesia   . Diabetes mellitus without complication (HCC)   . Dyspnea   . Gout   . High cholesterol   . History of  kidney stones   . Hypertension   . Hypoxia 08/2016  . Osteoarthritis   . PONV (postoperative nausea and vomiting)   . Rheumatoid arthritis (HCC)     Family History  Problem Relation Age of Onset  . COPD Brother    Past Surgical History:  Procedure Laterality Date  . ABDOMINAL HYSTERECTOMY     Social History   Social History Narrative  . No narrative on file     Objective: Vital Signs: There were no vitals taken for this visit.   Physical Exam   Musculoskeletal Exam: ***  CDAI Exam: No CDAI exam completed.    Investigation: No additional findings. CBC Latest Ref Rng & Units 10/15/2016 09/30/2016 09/07/2016  WBC 3.8 - 10.8 K/uL 8.9 10.1 6.7  Hemoglobin 11.7 - 15.5 g/dL 40.9 11.6(L) 11.1(L)  Hematocrit 35.0 - 45.0 % 39.7 37.1 35.0(L)  Platelets 140 - 400 K/uL 217 197 412(H)   CMP Latest Ref Rng & Units 10/15/2016 09/30/2016 09/07/2016  Glucose 65 - 99 mg/dL 811(B) 147(W) 295(A)  BUN 7 - 25 mg/dL 21(H) 08(M) 57(Q)  Creatinine 0.60 - 0.93 mg/dL 4.69 6.29 5.28  Sodium 135 - 146 mmol/L 140 142 137  Potassium 3.5 - 5.3 mmol/L 4.0 4.5 4.9  Chloride 98 - 110 mmol/L 107 109 107  CO2 20 - 32 mmol/L 17(L) 16(L) 21(L)  Calcium 8.6 - 10.4 mg/dL 9.3 8.7 9.2  Total Protein 6.1 - 8.1 g/dL 6.6 6.2 -  Total Bilirubin 0.2 - 1.2 mg/dL 0.5 0.5 -  Alkaline Phos 33 - 130 U/L 40 36 -  AST 10 - 35 U/L 11 11 -  ALT 6 - 29 U/L 12 13 -  Imaging: No results found.  Speciality Comments: No specialty comments available.    Procedures:  No procedures performed Allergies: Aspirin and Codeine   Assessment / Plan:     Visit Diagnoses: Rheumatoid arthritis of multiple sites with negative rheumatoid factor (HCC)  High risk medication use - Arava 20 mg by mouth daily  Rheumatoid lung (HCC) - Possible interstitial changes on chest x-ray. She has been evaluated by Dr. Isaiah Serge. Consider high-resolution CT for further evaluation.  Primary osteoarthritis of both knees  Flexion contractures -  Of multiple joints  Idiopathic chronic gout of multiple sites without tophus - Allopurinol 200 mg by mouth daily  Age-related osteoporosis without current pathological fracture - on Fosamax, 12/09/15 T score -3.6 left femoral neck   History of shingles  History of hypothyroidism  History of hyperlipidemia  History of diabetes mellitus    Orders: No orders of the defined types were placed in this encounter.  No orders of the defined types were placed in this encounter.   Face-to-face time spent with patient was *** minutes. 50% of time was spent in counseling and coordination of care.  Follow-Up Instructions: No Follow-up on file.   Pollyann Savoy, MD  Note - This record has been created using Animal nutritionist.  Chart creation errors have been sought, but may not always  have been located. Such creation errors do not reflect on  the standard of medical care.

## 2016-12-02 ENCOUNTER — Ambulatory Visit: Payer: Medicare HMO | Admitting: Pulmonary Disease

## 2016-12-03 ENCOUNTER — Encounter: Payer: Self-pay | Admitting: Rheumatology

## 2016-12-03 ENCOUNTER — Ambulatory Visit: Payer: Medicare HMO | Admitting: Rheumatology

## 2016-12-03 ENCOUNTER — Ambulatory Visit (INDEPENDENT_AMBULATORY_CARE_PROVIDER_SITE_OTHER): Payer: Medicare HMO | Admitting: Rheumatology

## 2016-12-03 VITALS — BP 121/76 | HR 82 | Resp 14

## 2016-12-03 DIAGNOSIS — Z780 Asymptomatic menopausal state: Secondary | ICD-10-CM | POA: Diagnosis not present

## 2016-12-03 DIAGNOSIS — Z79899 Other long term (current) drug therapy: Secondary | ICD-10-CM

## 2016-12-03 DIAGNOSIS — M0609 Rheumatoid arthritis without rheumatoid factor, multiple sites: Secondary | ICD-10-CM | POA: Diagnosis not present

## 2016-12-03 MED ORDER — HYDROXYCHLOROQUINE SULFATE 200 MG PO TABS: ORAL_TABLET | ORAL | 2 refills | Status: DC

## 2016-12-03 NOTE — Progress Notes (Signed)
Office Visit Note  Patient: Mary [name]             Date of Birth: February 04, 1941           MRN: [id number]             PCP: Dema Severin, NP Referring: Dema Severin, NP Visit Date: 12/03/2016 Occupation: @GUAROCC @    Subjective:  Medication Management   History of Present Illness: Mary [name] is a 76 y.o. female  As last seen 08/24/2016 for rheumatoid arthritis that was seronegative. She did not do well with methotrexate and it was decided at that visit to try Arava.  Today, she states that she is ==> Doing better with arava Does well w/ joints in daytime; but has pain at night (from her knees--hands and wrists are "not bad") Needs tylenol for pain at night --knee pain ( "5" and "gets better")  Activities of Daily Living:  Patient reports morning stiffness for 30 minutes.   Patient Reports nocturnal pain.  Difficulty dressing/grooming: Reports Difficulty climbing stairs: Reports Difficulty getting out of chair: Reports Difficulty using hands for taps, buttons, cutlery, and/or writing: Reports   Review of Systems  Constitutional: Negative for fatigue.  HENT: Negative for mouth sores and mouth dryness.   Eyes: Negative for dryness.  Respiratory: Negative for shortness of breath.   Gastrointestinal: Negative for constipation and diarrhea.  Musculoskeletal: Negative for myalgias and myalgias.  Skin: Negative for sensitivity to sunlight.  Psychiatric/Behavioral: Negative for decreased concentration and sleep disturbance.    PMFS History:  Patient Active Problem List   Diagnosis Date Noted  . Age-related osteoporosis without current pathological fracture 11/19/2016  . Acute respiratory failure (HCC)   . Rheumatoid lung (HCC)   . Hypoxia 09/03/2016  . Essential hypertension 09/03/2016  . Urinary tract infection 09/03/2016  . 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 High risk medication use 02/28/2016  .  Idiopathic chronic gout of multiple sites without tophus 02/28/2016  . Primary osteoarthritis of both knees 02/28/2016  . Flexion contractures 02/28/2016  . History of hypothyroidism 02/28/2016  . History of diabetes mellitus 02/28/2016  . History of shingles 02/28/2016  . History of hyperlipidemia 02/28/2016    Past Medical History:  Diagnosis Date  . Complication of anesthesia   . Diabetes mellitus without complication (HCC)   . Dyspnea   . Gout   . High cholesterol   . History of kidney stones   . Hypertension   . Hypoxia 08/2016  . Osteoarthritis   . PONV (postoperative nausea and vomiting)   . Rheumatoid arthritis (HCC)     Family History  Problem Relation Age of Onset  . COPD Brother    Past Surgical History:  Procedure Laterality Date  . ABDOMINAL HYSTERECTOMY     Social History   Social History Narrative  . No narrative on file     Objective: Vital Signs: BP 121/76 (BP Location: Left Arm, Patient Position: Sitting, Cuff Size: Large)   Pulse 82   Resp 14    Physical Exam  Constitutional: She is oriented to person, place, and time. She appears well-developed and well-nourished.  HENT:  Head: Normocephalic and atraumatic.  Eyes: Pupils are equal, round, and reactive to light. EOM are normal.  Cardiovascular: Normal rate, regular rhythm and normal heart sounds.  Exam reveals no gallop and no friction rub.   No murmur heard. Pulmonary/Chest: Effort normal and breath sounds normal. She has no wheezes.  She has no rales.  Abdominal: Soft. Bowel sounds are normal. She exhibits no distension. There is no tenderness. There is no guarding. No hernia.  Musculoskeletal: Normal range of motion. She exhibits no edema, tenderness or deformity.  Lymphadenopathy:    She has no cervical adenopathy.  Neurological: She is alert and oriented to person, place, and time. Coordination normal.  Skin: Skin is warm and dry. Capillary refill takes less than 2 seconds. No rash noted.    Psychiatric: She has a normal mood and affect. Her behavior is normal.  Nursing note and vitals reviewed.    Musculoskeletal Exam:  Full range of motion of all joints Grip strength is equal and strong bilaterally but handsare arthritic and have contractures and DIP PIP angulations Fiber myalgia tender points are absent  CDAI Exam: CDAI Homunculus Exam:   Joint Counts:  CDAI Tender Joint count: 0 CDAI Swollen Joint count: 0   No synovitis on exam  Investigation: No additional findings. Orders Only on 10/15/2016  Component Date Value Ref Range Status  . WBC 10/15/2016 8.9  3.8 - 10.8 K/uL Final   Few atypical lymphocytes noted.  Marland Kitchen RBC 10/15/2016 4.45  3.80 - 5.10 MIL/uL Final  . Hemoglobin 10/15/2016 12.7  11.7 - 15.5 g/dL Final  . HCT 10/15/2016 39.7  35.0 - 45.0 % Final  . MCV 10/15/2016 89.2  80.0 - 100.0 fL Final  . MCH 10/15/2016 28.5  27.0 - 33.0 pg Final  . MCHC 10/15/2016 32.0  32.0 - 36.0 g/dL Final  . RDW 10/15/2016 19.1* 11.0 - 15.0 % Final  . Platelets 10/15/2016 217  140 - 400 K/uL Final  . MPV 10/15/2016 11.8  7.5 - 12.5 fL Final  . Neutro Abs 10/15/2016 5251  1,500 - 7,800 cells/uL Final  . Lymphs Abs 10/15/2016 2937  850 - 3,900 cells/uL Final  . Monocytes Absolute 10/15/2016 623  200 - 950 cells/uL Final  . Eosinophils Absolute 10/15/2016 89  15 - 500 cells/uL Final  . Basophils Absolute 10/15/2016 0  0 - 200 cells/uL Final  . Neutrophils Relative % 10/15/2016 59  % Final  . Lymphocytes Relative 10/15/2016 33  % Final  . Monocytes Relative 10/15/2016 7  % Final  . Eosinophils Relative 10/15/2016 1  % Final  . Basophils Relative 10/15/2016 0  % Final  . Smear Review 10/15/2016 Criteria for review not met   Final  . Sodium 10/15/2016 140  135 - 146 mmol/L Final  . Potassium 10/15/2016 4.0  3.5 - 5.3 mmol/L Final  . Chloride 10/15/2016 107  98 - 110 mmol/L Final  . CO2 10/15/2016 17* 20 - 32 mmol/L Final   Comment: ** Please note change in reference  range(s). **     . Glucose, Bld 10/15/2016 154* 65 - 99 mg/dL Final  . BUN 10/15/2016 27* 7 - 25 mg/dL Final  . Creat 10/15/2016 0.68  0.60 - 0.93 mg/dL Final   Comment:   For patients > or = 74 years of age: The upper reference limit for Creatinine is approximately 13% higher for people identified as African-American.     . Total Bilirubin 10/15/2016 0.5  0.2 - 1.2 mg/dL Final  . Alkaline Phosphatase 10/15/2016 40  33 - 130 U/L Final  . AST 10/15/2016 11  10 - 35 U/L Final  . ALT 10/15/2016 12  6 - 29 U/L Final  . Total Protein 10/15/2016 6.6  6.1 - 8.1 g/dL Final  . Albumin 10/15/2016 4.1  3.6 -  5.1 g/dL Final  . Calcium 10/15/2016 9.3  8.6 - 10.4 mg/dL Final  . GFR, Est African American 10/15/2016 >89  >=60 mL/min Final  . GFR, Est Non African American 10/15/2016 86  >=60 mL/min Final  Orders Only on 09/30/2016  Component Date Value Ref Range Status  . WBC 09/30/2016 10.1  3.8 - 10.8 K/uL Final  . RBC 09/30/2016 3.99  3.80 - 5.10 MIL/uL Final  . Hemoglobin 09/30/2016 11.6* 11.7 - 15.5 g/dL Final  . HCT 09/30/2016 37.1  35.0 - 45.0 % Final  . MCV 09/30/2016 93.0  80.0 - 100.0 fL Final  . MCH 09/30/2016 29.1  27.0 - 33.0 pg Final  . MCHC 09/30/2016 31.3* 32.0 - 36.0 g/dL Final  . RDW 09/30/2016 18.9* 11.0 - 15.0 % Final  . Platelets 09/30/2016 197  140 - 400 K/uL Final  . MPV 09/30/2016 11.9  7.5 - 12.5 fL Final  . Neutro Abs 09/30/2016 6363  1,500 - 7,800 cells/uL Final  . Lymphs Abs 09/30/2016 3232  850 - 3,900 cells/uL Final  . Monocytes Absolute 09/30/2016 404  200 - 950 cells/uL Final  . Eosinophils Absolute 09/30/2016 101  15 - 500 cells/uL Final  . Basophils Absolute 09/30/2016 0  0 - 200 cells/uL Final  . Neutrophils Relative % 09/30/2016 63  % Final  . Lymphocytes Relative 09/30/2016 32  % Final  . Monocytes Relative 09/30/2016 4  % Final  . Eosinophils Relative 09/30/2016 1  % Final  . Basophils Relative 09/30/2016 0  % Final  . Smear Review 09/30/2016 Criteria  for review not met   Final  . Sodium 09/30/2016 142  135 - 146 mmol/L Final  . Potassium 09/30/2016 4.5  3.5 - 5.3 mmol/L Final  . Chloride 09/30/2016 109  98 - 110 mmol/L Final  . CO2 09/30/2016 16* 20 - 31 mmol/L Final  . Glucose, Bld 09/30/2016 121* 65 - 99 mg/dL Final  . BUN 09/30/2016 29* 7 - 25 mg/dL Final  . Creat 09/30/2016 0.72  0.60 - 0.93 mg/dL Final   Comment:   For patients > or = 74 years of age: The upper reference limit for Creatinine is approximately 13% higher for people identified as African-American.     . Total Bilirubin 09/30/2016 0.5  0.2 - 1.2 mg/dL Final  . Alkaline Phosphatase 09/30/2016 36  33 - 130 U/L Final  . AST 09/30/2016 11  10 - 35 U/L Final  . ALT 09/30/2016 13  6 - 29 U/L Final  . Total Protein 09/30/2016 6.2  6.1 - 8.1 g/dL Final  . Albumin 09/30/2016 3.8  3.6 - 5.1 g/dL Final  . Calcium 09/30/2016 8.7  8.6 - 10.4 mg/dL Final  . GFR, Est African American 09/30/2016 >89  >=60 mL/min Final  . GFR, Est Non African American 09/30/2016 83  >=60 mL/min Final  Admission on 09/03/2016, Discharged on 09/07/2016  Component Date Value Ref Range Status  . Sodium 09/03/2016 137  135 - 145 mmol/L Final  . Potassium 09/03/2016 5.3* 3.5 - 5.1 mmol/L Final  . Chloride 09/03/2016 109  101 - 111 mmol/L Final  . CO2 09/03/2016 19* 22 - 32 mmol/L Final  . Glucose, Bld 09/03/2016 135* 65 - 99 mg/dL Final  . BUN 09/03/2016 23* 6 - 20 mg/dL Final  . Creatinine, Ser 09/03/2016 0.96  0.44 - 1.00 mg/dL Final  . Calcium 09/03/2016 9.6  8.9 - 10.3 mg/dL Final  . Total Protein 09/03/2016 7.3  6.5 - 8.1  g/dL Final  . Albumin 09/03/2016 3.5  3.5 - 5.0 g/dL Final  . AST 09/03/2016 19  15 - 41 U/L Final  . ALT 09/03/2016 10* 14 - 54 U/L Final  . Alkaline Phosphatase 09/03/2016 63  38 - 126 U/L Final  . Total Bilirubin 09/03/2016 0.4  0.3 - 1.2 mg/dL Final  . GFR calc non Af Amer 09/03/2016 57* >60 mL/min Final  . GFR calc Af Amer 09/03/2016 >60  >60 mL/min Final   Comment:  (NOTE) The eGFR has been calculated using the CKD EPI equation. This calculation has not been validated in all clinical situations. eGFR's persistently <60 mL/min signify possible Chronic Kidney Disease.   . Anion gap 09/03/2016 9  5 - 15 Final  . B Natriuretic Peptide 09/03/2016 14.6  0.0 - 100.0 pg/mL Final  . WBC 09/03/2016 7.5  4.0 - 10.5 K/uL Final  . RBC 09/03/2016 3.91  3.87 - 5.11 MIL/uL Final  . Hemoglobin 09/03/2016 11.4* 12.0 - 15.0 g/dL Final  . HCT 09/03/2016 35.9* 36.0 - 46.0 % Final  . MCV 09/03/2016 91.8  78.0 - 100.0 fL Final  . MCH 09/03/2016 29.2  26.0 - 34.0 pg Final  . MCHC 09/03/2016 31.8  30.0 - 36.0 g/dL Final  . RDW 09/03/2016 17.9* 11.5 - 15.5 % Final  . Platelets 09/03/2016 412* 150 - 400 K/uL Final  . Neutrophils Relative % 09/03/2016 63  % Final  . Neutro Abs 09/03/2016 4.7  1.7 - 7.7 K/uL Final  . Lymphocytes Relative 09/03/2016 27  % Final  . Lymphs Abs 09/03/2016 2.0  0.7 - 4.0 K/uL Final  . Monocytes Relative 09/03/2016 5  % Final  . Monocytes Absolute 09/03/2016 0.4  0.1 - 1.0 K/uL Final  . Eosinophils Relative 09/03/2016 4  % Final  . Eosinophils Absolute 09/03/2016 0.3  0.0 - 0.7 K/uL Final  . Basophils Relative 09/03/2016 1  % Final  . Basophils Absolute 09/03/2016 0.1  0.0 - 0.1 K/uL Final  . Troponin i, poc 09/03/2016 0.00  0.00 - 0.08 ng/mL Final  . Comment 3 09/03/2016          Final   Comment: Due to the release kinetics of cTnI, a negative result within the first hours of the onset of symptoms does not rule out myocardial infarction with certainty. If myocardial infarction is still suspected, repeat the test at appropriate intervals.   . Color, Urine 09/03/2016 STRAW* YELLOW Final  . APPearance 09/03/2016 CLEAR  CLEAR Final  . Specific Gravity, Urine 09/03/2016 1.008  1.005 - 1.030 Final  . pH 09/03/2016 6.0  5.0 - 8.0 Final  . Glucose, UA 09/03/2016 NEGATIVE  NEGATIVE mg/dL Final  . Hgb urine dipstick 09/03/2016 NEGATIVE  NEGATIVE  Final  . Bilirubin Urine 09/03/2016 NEGATIVE  NEGATIVE Final  . Ketones, ur 09/03/2016 NEGATIVE  NEGATIVE mg/dL Final  . Protein, ur 09/03/2016 NEGATIVE  NEGATIVE mg/dL Final  . Nitrite 09/03/2016 NEGATIVE  NEGATIVE Final  . Leukocytes, UA 09/03/2016 NEGATIVE  NEGATIVE Final  . Magnesium 09/03/2016 1.7  1.7 - 2.4 mg/dL Final  . Weight 09/04/2016 2363.2  oz Final  . Height 09/04/2016 63  in Final  . BP 09/04/2016 107/58  mmHg Final  . LV PW d 09/04/2016 14* 0.6 - 1.1 mm Final  . FS 09/04/2016 40  28 - 44 % Final  . LA vol 09/04/2016 36.5  mL Final  . LA ID, A-P, ES 09/04/2016 35  mm Final  . IVS/LV PW  RATIO, ED 09/04/2016 .64   Final  . LVOT VTI 09/04/2016 15.6  cm Final  . LA diam index 09/04/2016 2.06  cm/m2 Final  . LA vol A4C 09/04/2016 36.6  ml Final  . LVOT peak grad rest 09/04/2016 4  mmHg Final  . LVOT diameter 09/04/2016 20  mm Final  . LVOT area 09/04/2016 3.14  cm2 Final  . LVOT peak vel 09/04/2016 97  cm/s Final  . LVOT SV 09/04/2016 49.00  mL Final  . LA vol index 09/04/2016 21.5  mL/m2 Final  . LA diam end sys 09/04/2016 35.00  mm Final  . Lateral S' vel 09/04/2016 15.10  cm/sec Final  . TAPSE 09/04/2016 17.40  mm Final  . Hgb A1c MFr Bld 09/03/2016 7.2* 4.8 - 5.6 % Final   Comment: (NOTE)         Pre-diabetes: 5.7 - 6.4         Diabetes: >6.4         Glycemic control for adults with diabetes: <7.0   . Mean Plasma Glucose 09/03/2016 160  mg/dL Final   Comment: (NOTE) Performed At: [facility] [address] [place], [place] [id number] Mila Homer MD XF:[id number]   . Glucose-Capillary 09/03/2016 203* 65 - 99 mg/dL Final  . Sed Rate 24/43/2985 47* 0 - 22 mm/hr Final  . CRP 09/03/2016 2.1* <1.0 mg/dL Final  . Rhuematoid fact SerPl-aCnc 09/03/2016 <10.0  0.0 - 13.9 IU/mL Final   Comment: (NOTE) Performed At: [facility] [address] [place], [place] [id number] Mila Homer MD YY:[id number]   . ANA Ab, IFA 09/03/2016 Positive*   Final   Comment: (NOTE)                                     Negative   <1:80                                     Borderline  1:80                                     Positive   >1:80 Performed At: [facility] [address] [place], [place] [id number] Mila Homer MD IC:[id number]   . Sodium 09/04/2016 133* 135 - 145 mmol/L Final  . Potassium 09/04/2016 5.4* 3.5 - 5.1 mmol/L Final  . Chloride 09/04/2016 108  101 - 111 mmol/L Final  . CO2 09/04/2016 17* 22 - 32 mmol/L Final  . Glucose, Bld 09/04/2016 248* 65 - 99 mg/dL Final  . BUN 61/67/4628 21* 6 - 20 mg/dL Final  . Creatinine, Ser 09/04/2016 0.75  0.44 - 1.00 mg/dL Final  . Calcium 28/66/8937 9.6  8.9 - 10.3 mg/dL Final  . GFR calc non Af Amer 09/04/2016 >60  >60 mL/min Final  . GFR calc Af Amer 09/04/2016 >60  >60 mL/min Final   Comment: (NOTE) The eGFR has been calculated using the CKD EPI equation. This calculation has not been validated in all clinical situations. eGFR's persistently <60 mL/min signify possible Chronic Kidney Disease.   . Anion gap 09/04/2016 8  5 - 15 Final  . WBC 09/04/2016 4.0  4.0 - 10.5 K/uL Final  . RBC 09/04/2016 3.82* 3.87 - 5.11 MIL/uL Final  .  Hemoglobin 09/04/2016 10.8* 12.0 - 15.0 g/dL Final  . HCT 09/04/2016 35.4* 36.0 - 46.0 % Final  . MCV 09/04/2016 92.7  78.0 - 100.0 fL Final  . MCH 09/04/2016 28.3  26.0 - 34.0 pg Final  . MCHC 09/04/2016 30.5  30.0 - 36.0 g/dL Final  . RDW 09/04/2016 17.9* 11.5 - 15.5 % Final  . Platelets 09/04/2016 410* 150 - 400 K/uL Final  . Glucose-Capillary 09/03/2016 117* 65 - 99 mg/dL Final  . Glucose-Capillary 09/03/2016 304* 65 - 99 mg/dL Final  . Glucose-Capillary 09/04/2016 226* 65 - 99 mg/dL Final  . Glucose-Capillary 09/04/2016 304* 65 - 99 mg/dL Final  . CCP Antibodies IgG/IgA 09/04/2016 7  0 - 19 units Final   Comment: (NOTE)                          Negative               <20                          Weak positive      20 - 39                           Moderate positive  40 - 59                          Strong positive        >59 Performed At: [facility] [place], [place] [id number] Lindon Romp MD JS:[id number]   . Procalcitonin 09/04/2016 <0.10  ng/mL Final   Comment:        Interpretation: PCT (Procalcitonin) <= 0.5 ng/mL: Systemic infection (sepsis) is not likely. Local bacterial infection is possible. (NOTE)         ICU PCT Algorithm               Non ICU PCT Algorithm    ----------------------------     ------------------------------         PCT < 0.25 ng/mL                 PCT < 0.1 ng/mL     Stopping of antibiotics            Stopping of antibiotics       strongly encouraged.               strongly encouraged.    ----------------------------     ------------------------------       PCT level decrease by               PCT < 0.25 ng/mL       >= 80% from peak PCT       OR PCT 0.25 - 0.5 ng/mL          Stopping of antibiotics                                             encouraged.     Stopping of antibiotics           encouraged.    ----------------------------     ------------------------------       PCT level decrease by  PCT >= 0.25 ng/mL       < 80% from peak PCT        AND PCT >= 0.5 ng/mL            Continuin                          g antibiotics                                              encouraged.       Continuing antibiotics            encouraged.    ----------------------------     ------------------------------     PCT level increase compared          PCT > 0.5 ng/mL         with peak PCT AND          PCT >= 0.5 ng/mL             Escalation of antibiotics                                          strongly encouraged.      Escalation of antibiotics        strongly encouraged.   Marland Kitchen A.Fumigatus #1 Abs 09/04/2016 Negative  Negative Final  . Micropolyspora faeni, IgG 09/04/2016 Negative  Negative Final  . Thermoactinomyces vulgaris, IgG 09/04/2016 Negative   Negative Final  . A. Pullulans Abs 09/04/2016 Negative  Negative Final  . Thermoact. Saccharii 09/04/2016 Negative  Negative Final  . Pigeon Serum Abs 09/04/2016 Negative  Negative Final   Comment: (NOTE) Performed At: [facility] [address] [place], [place] [id number] Mila Homer MD PO:[id number]   . Adenovirus 09/04/2016 NOT DETECTED  NOT DETECTED Final  . Coronavirus 229E 09/04/2016 NOT DETECTED  NOT DETECTED Final  . Coronavirus HKU1 09/04/2016 NOT DETECTED  NOT DETECTED Final  . Coronavirus NL63 09/04/2016 NOT DETECTED  NOT DETECTED Final  . Coronavirus OC43 09/04/2016 NOT DETECTED  NOT DETECTED Final  . Metapneumovirus 09/04/2016 NOT DETECTED  NOT DETECTED Final  . Rhinovirus / Enterovirus 09/04/2016 NOT DETECTED  NOT DETECTED Final  . Influenza A 09/04/2016 NOT DETECTED  NOT DETECTED Final  . Influenza B 09/04/2016 NOT DETECTED  NOT DETECTED Final  . Parainfluenza Virus 1 09/04/2016 NOT DETECTED  NOT DETECTED Final  . Parainfluenza Virus 2 09/04/2016 NOT DETECTED  NOT DETECTED Final  . Parainfluenza Virus 3 09/04/2016 NOT DETECTED  NOT DETECTED Final  . Parainfluenza Virus 4 09/04/2016 NOT DETECTED  NOT DETECTED Final  . Respiratory Syncytial Virus 09/04/2016 NOT DETECTED  NOT DETECTED Final  . Bordetella pertussis 09/04/2016 NOT DETECTED  NOT DETECTED Final  . Chlamydophila pneumoniae 09/04/2016 NOT DETECTED  NOT DETECTED Final  . Mycoplasma pneumoniae 09/04/2016 NOT DETECTED  NOT DETECTED Final  . Glucose-Capillary 09/04/2016 150* 65 - 99 mg/dL Final  . Sodium 10/41/4120 133* 135 - 145 mmol/L Final  . Potassium 09/05/2016 5.1  3.5 - 5.1 mmol/L Final  . Chloride 09/05/2016 107  101 - 111 mmol/L Final  . CO2 09/05/2016 18* 22 - 32 mmol/L Final  . Glucose, Bld 09/05/2016 296* 65 - 99 mg/dL Final  .  BUN 09/05/2016 28* 6 - 20 mg/dL Final  . Creatinine, Ser 09/05/2016 0.84  0.44 - 1.00 mg/dL Final  . Calcium 09/05/2016 9.3  8.9 - 10.3 mg/dL Final  . GFR calc  non Af Amer 09/05/2016 >60  >60 mL/min Final  . GFR calc Af Amer 09/05/2016 >60  >60 mL/min Final   Comment: (NOTE) The eGFR has been calculated using the CKD EPI equation. This calculation has not been validated in all clinical situations. eGFR's persistently <60 mL/min signify possible Chronic Kidney Disease.   . Anion gap 09/05/2016 8  5 - 15 Final  . [facility] test code 09/05/2016 [id number]   Final  . [facility] test name 09/05/2016 Richrd Prime D GLUCAN   Final  . Source ([facility]) 09/05/2016 SERUM   Final  . Misc [facility] result 09/05/2016 COMMENT   Final   Comment: (NOTE) Test Ordered: [id number] Fungitell, Serum Result                         35               pg/mL    NEWXW     Reference Range: <80                                   Interpretation: The Fungitell assay does not detect certain fungal species such as the genus Cryptococcus Baldomero Lamy et al. 4132) which produces very low levels of (1-3)-Beta-D-Glucan. The assay also does not detect the Zygomycetes such as Absidia, Mucor and Rhizopus ([place]) which are not known to produce (1-3)-Beta-D-Glucan. In addition, the yeast phase of Blastomyces dermatitidis produces little (1-3)-Beta-D-Glucan and may not be detected by the assay Charise Carwin et al. 2007). Reference Range: Less than 60 pg/mL. Glucan values of less than 60 pg/mL are interpreted as negative. Glucan values of 60 to 79 pg/mL are interpreted as indeterminate, and suggest a possible fungal infection. Additional sampling and testing of sera is required to interpret the results. Glucan values of greater than or                           equal to 80 pg/mL are interpreted as positive. Due to the potential for environmental contamination when transferred to pour-off tubes, which can lead to false positive results, interpret positive results from samples provided in pour-off tubes with caution.  Results should be used in conjunction with clinical findings, and  should not form the sole basis for a diagnosis or treatment decision. The Fungitell test is approved or cleared for in vitro diagnostic use by the [place] and Drug Administration. Modifications to the approved package insert have been made and the performance characteristics for these modifications were determined by [facility]. If sample result is greater than 500 pg/mL, physician may order a titer of the sample. Please contact [facility] if you would like to order a retest of this sample to obtain an actual value. Samples are held for 1 week after initial testing date. Performed At: [facility] [address] [place]                          , [place] [id number] Lindon Romp MD DG:[id number] Performed At: [facility] [address] [place], [place] [id number] Ileana Ladd PhD PP:[id number]   .  Glucose-Capillary 09/04/2016 276* 65 - 99 mg/dL Final  . Glucose-Capillary 09/05/2016 259* 65 - 99 mg/dL Final  . Glucose-Capillary 09/05/2016 217* 65 - 99 mg/dL Final  . Procalcitonin 09/06/2016 <0.10  ng/mL Final   Comment:        Interpretation: PCT (Procalcitonin) <= 0.5 ng/mL: Systemic infection (sepsis) is not likely. Local bacterial infection is possible. (NOTE)         ICU PCT Algorithm               Non ICU PCT Algorithm    ----------------------------     ------------------------------         PCT < 0.25 ng/mL                 PCT < 0.1 ng/mL     Stopping of antibiotics            Stopping of antibiotics       strongly encouraged.               strongly encouraged.    ----------------------------     ------------------------------       PCT level decrease by               PCT < 0.25 ng/mL       >= 80% from peak PCT       OR PCT 0.25 - 0.5 ng/mL          Stopping of antibiotics                                             encouraged.     Stopping of antibiotics           encouraged.    ----------------------------      ------------------------------       PCT level decrease by              PCT >= 0.25 ng/mL       < 80% from peak PCT        AND PCT >= 0.5 ng/mL            Continuin                          g antibiotics                                              encouraged.       Continuing antibiotics            encouraged.    ----------------------------     ------------------------------     PCT level increase compared          PCT > 0.5 ng/mL         with peak PCT AND          PCT >= 0.5 ng/mL             Escalation of antibiotics                                          strongly encouraged.      Escalation of antibiotics  strongly encouraged.   . Glucose-Capillary 09/05/2016 225* 65 - 99 mg/dL Final  . Sodium 09/06/2016 135  135 - 145 mmol/L Final  . Potassium 09/06/2016 4.9  3.5 - 5.1 mmol/L Final  . Chloride 09/06/2016 107  101 - 111 mmol/L Final  . CO2 09/06/2016 19* 22 - 32 mmol/L Final  . Glucose, Bld 09/06/2016 277* 65 - 99 mg/dL Final  . BUN 09/06/2016 28* 6 - 20 mg/dL Final  . Creatinine, Ser 09/06/2016 0.69  0.44 - 1.00 mg/dL Final  . Calcium 09/06/2016 9.2  8.9 - 10.3 mg/dL Final  . GFR calc non Af Amer 09/06/2016 >60  >60 mL/min Final  . GFR calc Af Amer 09/06/2016 >60  >60 mL/min Final   Comment: (NOTE) The eGFR has been calculated using the CKD EPI equation. This calculation has not been validated in all clinical situations. eGFR's persistently <60 mL/min signify possible Chronic Kidney Disease.   . Anion gap 09/06/2016 9  5 - 15 Final  . Glucose-Capillary 09/05/2016 245* 65 - 99 mg/dL Final  . Glucose-Capillary 09/06/2016 255* 65 - 99 mg/dL Final  . Glucose-Capillary 09/06/2016 156* 65 - 99 mg/dL Final  . Sodium 09/07/2016 137  135 - 145 mmol/L Final  . Potassium 09/07/2016 4.9  3.5 - 5.1 mmol/L Final  . Chloride 09/07/2016 107  101 - 111 mmol/L Final  . CO2 09/07/2016 21* 22 - 32 mmol/L Final  . Glucose, Bld 09/07/2016 245* 65 - 99 mg/dL Final  . BUN 09/07/2016  28* 6 - 20 mg/dL Final  . Creatinine, Ser 09/07/2016 0.65  0.44 - 1.00 mg/dL Final  . Calcium 09/07/2016 9.2  8.9 - 10.3 mg/dL Final  . GFR calc non Af Amer 09/07/2016 >60  >60 mL/min Final  . GFR calc Af Amer 09/07/2016 >60  >60 mL/min Final   Comment: (NOTE) The eGFR has been calculated using the CKD EPI equation. This calculation has not been validated in all clinical situations. eGFR's persistently <60 mL/min signify possible Chronic Kidney Disease.   . Anion gap 09/07/2016 9  5 - 15 Final  . WBC 09/07/2016 6.7  4.0 - 10.5 K/uL Final  . RBC 09/07/2016 3.86* 3.87 - 5.11 MIL/uL Final  . Hemoglobin 09/07/2016 11.1* 12.0 - 15.0 g/dL Final  . HCT 09/07/2016 35.0* 36.0 - 46.0 % Final  . MCV 09/07/2016 90.7  78.0 - 100.0 fL Final  . MCH 09/07/2016 28.8  26.0 - 34.0 pg Final  . MCHC 09/07/2016 31.7  30.0 - 36.0 g/dL Final  . RDW 09/07/2016 17.3* 11.5 - 15.5 % Final  . Platelets 09/07/2016 412* 150 - 400 K/uL Final  . Glucose-Capillary 09/06/2016 268* 65 - 99 mg/dL Final  . Glucose-Capillary 09/07/2016 315* 65 - 99 mg/dL Final  . Glucose-Capillary 09/07/2016 220* 65 - 99 mg/dL Final  . Glucose-Capillary 09/07/2016 197* 65 - 99 mg/dL Final  . Homogeneous Pattern 09/03/2016 1:80   Final  . Note: 09/03/2016 Comment   Final   Comment: (NOTE) A positive ANA result may occur in healthy individuals (low titer) or be associated with a variety of diseases.  See interpretation chart which is not all inclusive: Pattern      Antigen Detected  Suggested Disease Association -----------  ----------------  ----------------------------- Homogeneous  DNA(ds,ss),       SLE - High titers             Nucleosomes,             Histones  Drug-induced SLE -----------  ----------------  ----------------------------- Speckled     Sm, RNP, SCL-70,  SLE,MCTD,PSS (diffuse form),             SS-A/SS-B         Sjogrens -----------  ----------------  ----------------------------- Nucleolar    SCL-70,  PM-1/SCL  High titers Scleroderma,                               PM/DM -----------  ----------------  ----------------------------- Centromere   Centromere        PSS (limited form) w/Crest                               syndrome variable -----------  ----------------  ----------------------------- Nuclear Dot  Sp100,p80-c                          oilin  Primary Biliary Cirrhosis -----------  ----------------  ----------------------------- Nuclear      GP210,            Primary Biliary Cirrhosis Membrane     lamin A,B,C -----------  ----------------  ----------------------------- Performed At: [facility] [address] [place], [place] [id number] Mila Homer MD RL:[id number]   Office Visit on 08/24/2016  Component Date Value Ref Range Status  . WBC 08/24/2016 6.7  3.8 - 10.8 K/uL Final  . RBC 08/24/2016 4.05  3.80 - 5.10 MIL/uL Final  . Hemoglobin 08/24/2016 12.1  11.7 - 15.5 g/dL Final  . HCT 71/62/7269 37.5  35.0 - 45.0 % Final  . MCV 08/24/2016 92.6  80.0 - 100.0 fL Final  . MCH 08/24/2016 29.9  27.0 - 33.0 pg Final  . MCHC 08/24/2016 32.3  32.0 - 36.0 g/dL Final  . RDW 13/14/7529 17.9* 11.0 - 15.0 % Final  . Platelets 08/24/2016 412* 140 - 400 K/uL Final  . MPV 08/24/2016 10.7  7.5 - 12.5 fL Final  . Neutro Abs 08/24/2016 4757  1,500 - 7,800 cells/uL Final  . Lymphs Abs 08/24/2016 1072  850 - 3,900 cells/uL Final  . Monocytes Absolute 08/24/2016 670  200 - 950 cells/uL Final  . Eosinophils Absolute 08/24/2016 134  15 - 500 cells/uL Final  . Basophils Absolute 08/24/2016 67  0 - 200 cells/uL Final  . Neutrophils Relative % 08/24/2016 71  % Final  . Lymphocytes Relative 08/24/2016 16  % Final  . Monocytes Relative 08/24/2016 10  % Final  . Eosinophils Relative 08/24/2016 2  % Final  . Basophils Relative 08/24/2016 1  % Final  . Smear Review 08/24/2016 Criteria for review not met   Final  . Color, Urine 08/24/2016 YELLOW  YELLOW Final  . APPearance 08/24/2016  CLEAR  CLEAR Final  . Specific Gravity, Urine 08/24/2016 1.018  1.001 - 1.035 Final  . pH 08/24/2016 5.5  5.0 - 8.0 Final  . Glucose, UA 08/24/2016 NEGATIVE  NEGATIVE Final  . Bilirubin Urine 08/24/2016 NEGATIVE  NEGATIVE Final  . Ketones, ur 08/24/2016 NEGATIVE  NEGATIVE Final  . Hgb urine dipstick 08/24/2016 NEGATIVE  NEGATIVE Final  . Protein, ur 08/24/2016 1+* NEGATIVE Final  . Nitrite 08/24/2016 POSITIVE* NEGATIVE Final  . Leukocytes, UA 08/24/2016 1+* NEGATIVE Final  . WBC, UA 08/24/2016 6-10* <=5 WBC/HPF Final  . RBC / HPF 08/24/2016 NONE SEEN  <=2 RBC/HPF Final  . Squamous Epithelial / LPF  08/24/2016 NONE SEEN  <=5 HPF Final  . Bacteria, UA 08/24/2016 MANY* NONE SEEN HPF Final  . Crystals 08/24/2016 NONE SEEN  NONE SEEN HPF Final  . Casts 08/24/2016 NONE SEEN  NONE SEEN LPF Final  . Yeast 08/24/2016 NONE SEEN  NONE SEEN HPF Final  Office Visit on 07/02/2016  Component Date Value Ref Range Status  . WBC 07/28/2016 6.6  3.4 - 10.8 x10E3/uL Final  . RBC 07/28/2016 3.72* 3.77 - 5.28 x10E6/uL Final  . Hemoglobin 07/28/2016 11.4  11.1 - 15.9 g/dL Final  . Hematocrit 07/28/2016 35.1  34.0 - 46.6 % Final  . MCV 07/28/2016 94  79 - 97 fL Final  . MCH 07/28/2016 30.6  26.6 - 33.0 pg Final  . MCHC 07/28/2016 32.5  31.5 - 35.7 g/dL Final  . RDW 07/28/2016 17.8* 12.3 - 15.4 % Final  . Platelets 07/28/2016 342  150 - 379 x10E3/uL Final  . Neutrophils 07/28/2016 65  Not Estab. % Final  . Lymphs 07/28/2016 22  Not Estab. % Final  . Monocytes 07/28/2016 8  Not Estab. % Final  . Eos 07/28/2016 4  Not Estab. % Final  . Basos 07/28/2016 1  Not Estab. % Final  . Neutrophils Absolute 07/28/2016 4.3  1.4 - 7.0 x10E3/uL Final  . Lymphocytes Absolute 07/28/2016 1.4  0.7 - 3.1 x10E3/uL Final  . Monocytes Absolute 07/28/2016 0.6  0.1 - 0.9 x10E3/uL Final  . EOS (ABSOLUTE) 07/28/2016 0.3  0.0 - 0.4 x10E3/uL Final  . Basophils Absolute 07/28/2016 0.1  0.0 - 0.2 x10E3/uL Final  . Immature  Granulocytes 07/28/2016 0  Not Estab. % Final  . Immature Grans (Abs) 07/28/2016 0.0  0.0 - 0.1 x10E3/uL Final  . Glucose 07/28/2016 178* 65 - 99 mg/dL Final  . BUN 07/28/2016 15  8 - 27 mg/dL Final  . Creatinine, Ser 07/28/2016 0.65  0.57 - 1.00 mg/dL Final  . GFR calc non Af Amer 07/28/2016 88  >59 mL/min/1.73 Final  . GFR calc Af Amer 07/28/2016 101  >59 mL/min/1.73 Final  . BUN/Creatinine Ratio 07/28/2016 23  12 - 28 Final  . Sodium 07/28/2016 139  134 - 144 mmol/L Final  . Potassium 07/28/2016 4.8  3.5 - 5.2 mmol/L Final  . Chloride 07/28/2016 104  96 - 106 mmol/L Final  . CO2 07/28/2016 18  18 - 29 mmol/L Final   Comment: **Effective August 10, 2016 Carbon Dioxide, Total**   reference interval will be changing to:              Age                  Female          Female      0 days   - 30 days         16 - 37        16 - 33     31 days   -  1 year         15 - 63        15 - 25      2 years  -  5 years        69 - 26        17 - 17      6 years  - 12 years        51 - 24        19 - 35                >  12 years        20 - 70        20 - 29   . Calcium 07/28/2016 9.3  8.7 - 10.3 mg/dL Final  . Total Protein 07/28/2016 7.8  6.0 - 8.5 g/dL Final  . Albumin 07/28/2016 4.3  3.5 - 4.8 g/dL Final  . Globulin, Total 07/28/2016 3.5  1.5 - 4.5 g/dL Final  . Albumin/Globulin Ratio 07/28/2016 1.2  1.2 - 2.2 Final  . Bilirubin Total 07/28/2016 0.2  0.0 - 1.2 mg/dL Final  . Alkaline Phosphatase 07/28/2016 64  39 - 117 IU/L Final  . AST 07/28/2016 13  0 - 40 IU/L Final  . ALT 07/28/2016 13  0 - 32 IU/L Final     Imaging: No results found.  Speciality Comments: No specialty comments available.    Procedures:  No procedures performed Allergies: Aspirin and Codeine   Assessment / Plan:     Visit Diagnoses: High risk medication use - Plan: CBC with Differential/Platelet, COMPLETE METABOLIC PANEL WITH GFR, VITAMIN D 25 Hydroxy (Vit-D Deficiency, Fractures)  Rheumatoid arthritis of  multiple sites with negative rheumatoid factor (HCC)  Post-menopausal - Plan: VITAMIN D 25 Hydroxy (Vit-D Deficiency, Fractures)   Plan: #1: Rheumatoid arthritis. Doing well. Improved with Arava. Taking 20 mg daily.  #2: High risk prescription arava 20 mg daily. (Better with this then when she was takxate). We will do dual therapy (plan to add Plaquenil 200 mg twice a day Monday through Friday) and see if she improves with that.  #3: Osteoporosis. Lumbar spine shows a T score of -3.6 done October 2017. Patient is on Fosamax weekly.  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 Severe. Insurance is not covering the Visco injections well and it is too expensive for the patient to afford  #6: Return to clinic in 3 months.  #7: Plaquenil eye exam form handed to patient and she'll get it done this month. She wants to start the Plaquenilnow. I'm agreeable as long as she gets her Plaquenil eye exam done in the next 3-4 weeks.  #8: Plaquenil 200 mg twice a day Monday through Friday; none on Saturday or Sunday. Patient's daughter Larene Beach was instructed on how to give this medication to her mom. Larene Beach understands and is agreeable  #9: G6PD is not necessary per Dr. Estanislado Pandy  #10: CBC with differential, CMP with GFR today and then againone month after starting Plaquenil.  Orders: Orders Placed This Encounter  Procedures  . CBC with Differential/Platelet  . COMPLETE METABOLIC PANEL WITH GFR  . VITAMIN D 25 Hydroxy (Vit-D Deficiency, Fractures)   Meds ordered this encounter  Medications  . hydroxychloroquine (PLAQUENIL) 200 MG tablet    Sig: 200 mg twice a day, Monday through Friday only; none on Saturday or Sunday    Dispense:  40 tablet    Refill:  2    Order Specific Question:   Supervising Provider    Answer:   Bo Merino [phone]    Face-to-face time spent with patient was 40 minutes. 50% of time was spent in counseling and  coordination of care.  Follow-Up Instructions: Return in about 3 months (around 03/05/2017) for RA,arava '20mg'$  qd//plq 200 bid m-f // recheck 62mo.   NEliezer Lofts PA-C I examined and evaluated the patient with NEliezer LoftsPA. The plan of care was discussed as noted above.  SBo Merino MD Note - This record has been created using DEditor, commissioning  Chart  creation errors have been sought, but may not always  have been located. Such creation errors do not reflect on  the standard of medical care.

## 2016-12-03 NOTE — Patient Instructions (Signed)

## 2016-12-04 LAB — CBC WITH DIFFERENTIAL/PLATELET
BASOS ABS: 53 {cells}/uL (ref 0–200)
BASOS PCT: 0.5 %
EOS PCT: 0.4 %
Eosinophils Absolute: 42 cells/uL (ref 15–500)
HEMATOCRIT: 36.4 % (ref 35.0–45.0)
HEMOGLOBIN: 11.8 g/dL (ref 11.7–15.5)
LYMPHS ABS: 1082 {cells}/uL (ref 850–3900)
MCH: 28.9 pg (ref 27.0–33.0)
MCHC: 32.4 g/dL (ref 32.0–36.0)
MCV: 89.2 fL (ref 80.0–100.0)
MONOS PCT: 3.3 %
MPV: 13.7 fL — AB (ref 7.5–12.5)
NEUTROS ABS: 8978 {cells}/uL — AB (ref 1500–7800)
Neutrophils Relative %: 85.5 %
Platelets: 211 10*3/uL (ref 140–400)
RBC: 4.08 10*6/uL (ref 3.80–5.10)
RDW: 17.3 % — ABNORMAL HIGH (ref 11.0–15.0)
Total Lymphocyte: 10.3 %
WBC mixed population: 347 cells/uL (ref 200–950)
WBC: 10.5 10*3/uL (ref 3.8–10.8)

## 2016-12-04 LAB — COMPLETE METABOLIC PANEL WITH GFR
AG Ratio: 1.5 (calc) (ref 1.0–2.5)
ALBUMIN MSPROF: 4.2 g/dL (ref 3.6–5.1)
ALKALINE PHOSPHATASE (APISO): 46 U/L (ref 33–130)
ALT: 11 U/L (ref 6–29)
AST: 13 U/L (ref 10–35)
BUN: 20 mg/dL (ref 7–25)
CALCIUM: 9.5 mg/dL (ref 8.6–10.4)
CO2: 19 mmol/L — AB (ref 20–32)
CREATININE: 0.67 mg/dL (ref 0.60–0.93)
Chloride: 108 mmol/L (ref 98–110)
GFR, EST NON AFRICAN AMERICAN: 87 mL/min/{1.73_m2} (ref >=60)
GFR, Est African American: 100 mL/min/{1.73_m2} (ref >=60)
GLUCOSE: 131 mg/dL — AB (ref 65–99)
Globulin: 2.8 g/dL (calc) (ref 1.9–3.7)
Potassium: 4.5 mmol/L (ref 3.5–5.3)
Sodium: 140 mmol/L (ref 135–146)
Total Bilirubin: 0.4 mg/dL (ref 0.2–1.2)
Total Protein: 7 g/dL (ref 6.1–8.1)

## 2016-12-04 LAB — VITAMIN D 25 HYDROXY (VIT D DEFICIENCY, FRACTURES): VIT D 25 HYDROXY: 40 ng/mL (ref 30–100)

## 2016-12-04 NOTE — Progress Notes (Signed)
Labs are stable.

## 2016-12-07 ENCOUNTER — Ambulatory Visit (INDEPENDENT_AMBULATORY_CARE_PROVIDER_SITE_OTHER): Payer: Medicare HMO | Admitting: Pulmonary Disease

## 2016-12-07 ENCOUNTER — Other Ambulatory Visit: Payer: Self-pay | Admitting: Emergency Medicine

## 2016-12-07 DIAGNOSIS — J849 Interstitial pulmonary disease, unspecified: Secondary | ICD-10-CM

## 2016-12-07 MED ORDER — PREDNISONE 10 MG PO TABS: 10.0000 mg | ORAL_TABLET | Freq: Every day | ORAL | 1 refills | Status: DC

## 2016-12-07 NOTE — Progress Notes (Signed)
Pt was unable to perform PFT to meet ATS standards. Pt was unable to follow directions to complete the tests. PFT was attempted by two PFT techs without success.

## 2016-12-07 NOTE — Telephone Encounter (Signed)
Pt had PFT today at office. Pt requested refill of prednisone  daily. This has been sent to preferred pharmacy. Called and spoke to pt's daughter, [name], and informed her this rx has been sent to pharmacy. [name] verbalized understanding and denied any further questions or concerns at this time.

## 2016-12-15 ENCOUNTER — Other Ambulatory Visit (INDEPENDENT_AMBULATORY_CARE_PROVIDER_SITE_OTHER): Payer: Self-pay | Admitting: Rheumatology

## 2016-12-15 NOTE — Telephone Encounter (Signed)
Last visit: 12/03/16 Next visit: 04/21/17 Labs: 12/03/16 stable   Ok to refill per Dr. Corliss Skains.

## 2016-12-17 ENCOUNTER — Encounter: Payer: Self-pay | Admitting: Pulmonary Disease

## 2016-12-17 ENCOUNTER — Ambulatory Visit (INDEPENDENT_AMBULATORY_CARE_PROVIDER_SITE_OTHER): Payer: Medicare HMO | Admitting: Pulmonary Disease

## 2016-12-17 VITALS — BP 108/68 | HR 92 | Ht 61.0 in | Wt 157.2 lb

## 2016-12-17 DIAGNOSIS — M0510 Rheumatoid lung disease with rheumatoid arthritis of unspecified site: Secondary | ICD-10-CM

## 2016-12-17 DIAGNOSIS — J849 Interstitial pulmonary disease, unspecified: Secondary | ICD-10-CM | POA: Diagnosis not present

## 2016-12-17 MED ORDER — PREDNISONE 5 MG PO TABS: ORAL_TABLET | ORAL | 0 refills | Status: DC

## 2016-12-17 NOTE — Progress Notes (Signed)
Mary [name]    [id number]    23-Aug-1940  Primary Care Physician:York, Orie Routegina F, NP  Referring Physician: Dema SeverinYork, Regina F, NP [address] [place], [place] [id number]  Chief complaint:   Follow up for RA-ILD  HPI: 76 year old with history of rheumatoid arthritis, diabetes, gout, hypertension, hyperlipidemia and hypothyroidism. She is being followed by Dr. Corliss Skainseveshwar for management of rheumatoid arthritis. She had been previously maintained on methotrexate. She was taken off methotrexate with plans to transition to leflunomide. Before initiation of leflunomide she was hospitalized July 2018 with respiratory failure, bilateral lung infiltrates. CT at that time did not show any pulmonary embolus but there are diffuse ground glass opacities concerning for interstitial lung disease or edema.  She was started on prednisone and diuresed with improvement in symptoms. Bronchoscopy was considered but deferred due to improvement in respiratory status. She has been discharged on a prednisone taper and has followed up with Dr. Corliss Skainseveshwar and is currently on [place]. She was seen in the pulmonary clinic on 7/26 and prednisone reduced to 20 mg.  She returns to the clinic today for further follow-up. She feels well with no respiratory complaints. Denies any cough, sputum production, dyspnea, wheezing. Currently on prednsione 10 mg  Outpatient Encounter Prescriptions as of 12/17/2016  Medication Sig  . acetaminophen (TYLENOL) 500 MG tablet   . alendronate (FOSAMAX) 70 MG tablet TAKE 1 TABLET WEEKLY, SAME TIME EACH WEEK. TAKE ON EMPTY STOMACH WITHFULL GLASS OF WATER. DON'T LIE DOWN 30 MIN.  Marland Kitchen. allopurinol (ZYLOPRIM) 100 MG tablet Take 100 mg by mouth 2 (two) times daily.   Marland Kitchen. atorvastatin (LIPITOR) 10 MG tablet Take 10 mg by mouth daily.  . benazepril (LOTENSIN) 20 MG tablet Take 1 tablet (20 mg total) by mouth daily.  . carvedilol (COREG) 6.25 MG tablet Take 6.25 mg by mouth 2 (two) times daily with a  meal.   . Cholecalciferol (VITAMIN D3) 5000 units TABS Take 1 tablet by mouth. Takes on Monday, Wednesday, and Friday.  . clorazepate (TRANXENE) 7.5 MG tablet Take 7.5 mg by mouth 2 (two) times daily.   . folic acid (FOLVITE) 1 MG tablet Take 2 tablets (2 mg total) by mouth daily.  Marland Kitchen. glipiZIDE (GLUCOTROL) 5 MG tablet   . hydroxychloroquine (PLAQUENIL) 200 MG tablet 200 mg twice a day, Monday through Friday only; none on Saturday or Sunday  . leflunomide (ARAVA) 20 MG tablet Take 1 tablet (20 mg total) by mouth daily. Labs due November 15  . levothyroxine (SYNTHROID, LEVOTHROID) 75 MCG tablet Take 75 mcg by mouth daily.   . metFORMIN (GLUCOPHAGE) 500 MG tablet Take 500 mg by mouth 2 (two) times daily with a meal.   . predniSONE (DELTASONE) 10 MG tablet Take 1 tablet (10 mg total) by mouth daily with breakfast.  . glipiZIDE (GLUCOTROL) 5 MG tablet Take 1 tablet (5 mg total) by mouth daily before breakfast.   No facility-administered encounter medications on file as of 12/17/2016.     Allergies as of 12/17/2016 - Review Complete 12/17/2016  Allergen Reaction Noted  . Aspirin Other (See Comments) 04/19/2015  . Codeine Nausea Only 04/19/2015    Past Medical History:  Diagnosis Date  . Complication of anesthesia   . Diabetes mellitus without complication (HCC)   . Dyspnea   . Gout   . High cholesterol   . History of kidney stones   . Hypertension   . Hypoxia 08/2016  . Osteoarthritis   .  PONV (postoperative nausea and vomiting)   . Rheumatoid arthritis [facility])     Past Surgical History:  Procedure Laterality Date  . ABDOMINAL HYSTERECTOMY      Family History  Problem Relation Age of Onset  . COPD Brother     Social History   Social History  . Marital status: Married    Spouse name: N/A  . Number of children: N/A  . Years of education: N/A   Occupational History  . Not on file.   Social History Main Topics  . Smoking status: Never Smoker  . Smokeless tobacco: Never  Used  . Alcohol use No  . Drug use: No  . Sexual activity: Not on file   Other Topics Concern  . Not on file   Social History Narrative  . No narrative on file    Review of systems: Review of Systems  Constitutional: Negative for fever and chills.  HENT: Negative.   Eyes: Negative for blurred vision.  Respiratory: as per HPI  Cardiovascular: Negative for chest pain and palpitations.  Gastrointestinal: Negative for vomiting, diarrhea, blood per rectum. Genitourinary: Negative for dysuria, urgency, frequency and hematuria.  Musculoskeletal: Negative for myalgias, back pain and joint pain.  Skin: Negative for itching and rash.  Neurological: Negative for dizziness, tremors, focal weakness, seizures and loss of consciousness.  Endo/Heme/Allergies: Negative for environmental allergies.  Psychiatric/Behavioral: Negative for depression, suicidal ideas and hallucinations.  All other systems reviewed and are negative.  Physical Exam: Blood pressure 108/68, pulse 92, height 5\' 1"  (1.549 m), weight 157 lb 3.2 oz (71.3 kg), SpO2 92 %. Gen:      No acute distress HEENT:  EOMI, sclera anicteric Neck:     No masses; no thyromegaly Lungs:    Clear to auscultation bilaterally; normal respiratory effort CV:         Regular rate and rhythm; no murmurs Abd:      + bowel sounds; soft, non-tender; no palpable masses, no distension Ext:    No edema; adequate peripheral perfusion Skin:      Warm and dry; no rash Neuro: alert and oriented x 3 Psych: normal mood and affect  Data Reviewed: CTA chest 09/03/16- Negative for pulmonary embolus. Extensive bilateral ground-glass opacities, bilateral hilar and mediastinal lymph nodes. Calcific aortic and coronary atherosclerosis. Hiatal hernia.  Chest x-ray 09/07/16-improvement in interstitial opacities. 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 She is also on Arava for management of her rheumatoid arthritis. Unable to get PFTs.   She is doing well on prednsione with no issues. We will continue to reduce the dose.  She'll need a high-resolution CT at some point. I'll wait to order it when she is completely off prednisone.  Plan/Recommendations: - Continue taper of prednisone. Reduce to 5 mg for 2 weeks and then 2.5 mg for 2 weeks and then stop  Follow up in 6 months  Chilton Greathouse MD [facility] Pager [phone] 12/17/2016, 11:27 AM  CC: Dema Severin, NP  Pollyann Savoy MD

## 2016-12-17 NOTE — Patient Instructions (Signed)
Please reduce prednisone to 5 mg daily for 2 weeks After that reduced to 2.5 milligrams daily for 2 weeks and then stopped See back in clinic in 4-6 months. Please give me a call if there is any change in his symptoms while we are reducing the prednisone.

## 2017-01-04 ENCOUNTER — Other Ambulatory Visit: Payer: Self-pay | Admitting: Rheumatology

## 2017-01-04 NOTE — Telephone Encounter (Signed)
Last Visit: 12/03/16 Next Visit: 04/21/17 Labs: 12/03/16 Stable  Okay to refill per Dr. Corliss Skainseveshwar

## 2017-02-01 ENCOUNTER — Other Ambulatory Visit: Payer: Self-pay | Admitting: Rheumatology

## 2017-03-15 ENCOUNTER — Other Ambulatory Visit (INDEPENDENT_AMBULATORY_CARE_PROVIDER_SITE_OTHER): Payer: Self-pay | Admitting: Rheumatology

## 2017-03-15 NOTE — Telephone Encounter (Signed)
Last Visit: 12/03/16 Next Visit: 04/21/17 Labs: 12/03/16 Stable  Okay to refill per Dr. Corliss Skainseveshwar

## 2017-03-31 ENCOUNTER — Telehealth (INDEPENDENT_AMBULATORY_CARE_PROVIDER_SITE_OTHER): Payer: Self-pay | Admitting: Radiology

## 2017-03-31 MED ORDER — HYDROXYCHLOROQUINE SULFATE 200 MG PO TABS: ORAL_TABLET | ORAL | 2 refills | Status: DC

## 2017-03-31 NOTE — Telephone Encounter (Signed)
See message.

## 2017-03-31 NOTE — Telephone Encounter (Signed)
Patient calling triage this morning. She is a Dr. Corliss Skainseveshwar patient. Wanting to know if she needs a refill on plaquenil she took her last pill this morning. [facility] Please call back patient to advise today.

## 2017-03-31 NOTE — Telephone Encounter (Signed)
Last Visit: 12/03/16 Next Visit: 04/21/17 Labs: 12/03/16 Stable LQ eye exam: 12/23/2016 Normal.  Okay to refill per Dr. Corliss Skainseveshwar  Patient advised prescription has been sent to the pharmacy.

## 2017-03-31 NOTE — Addendum Note (Signed)
Addended by: Henriette CombsHATTON, Zyen Triggs L on: 03/31/2017 02:19 PM   Modules accepted: Orders

## 2017-04-02 NOTE — Progress Notes (Signed)
Office Visit Note  Patient: Mary [name]             Date of Birth: 03-15-1940           MRN: [id number]             PCP: Dema SeverinYork, Regina F, NP Referring: Dema SeverinYork, Regina F, NP Visit Date: 04/07/2017 Occupation: @GUAROCC @    Subjective:  Bilateral feet pain    History of Present Illness: Mary MeierGaynelle [name] is a 77 y.o. female with a history of seronegative rheumatoid arthritis, gout, osteoporosis, and osteoarthritis.  Patient states she has noticed an improvement in joint stiffness since starting Plaquenil in October.  She continues to take Arava 20 mg daily.  She has discontinued prednisone.  She is on Plaquenil 200 mg twice daily Monday through Friday.  He denies any joint pain or joint swelling in her bilateral hands.  She also takes Fosamax every Saturday.  She denies any recent gout flares.  She continues to take allopurinol 100 mg twice daily.  She states that she has been seeing her podiatrist every 3-4 weeks due to ulcerations on her pressure points feet.  She was also diagnosed with diabetic neuropathy.  She wears shoes from her podiatrist, and she will be fit for orthotics once her ulcers heal.  She continues to have significant pain in bilateral feet.  She denies any swelling in her bilateral knees.  She does experience occasional discomfort in her right knee.  She has been in a wheelchair due to her feet pain and pressure ulcers.    Activities of Daily Living:  Patient reports morning stiffness for 0 minutes.   Patient Reports nocturnal pain.  Difficulty dressing/grooming: Denies Difficulty climbing stairs: Reports Difficulty getting out of chair: Reports Difficulty using hands for taps, buttons, cutlery, and/or writing: Reports   Review of Systems  Constitutional: Negative for fatigue and weakness.  HENT: Negative for mouth sores, mouth dryness and nose dryness.   Eyes: Positive for itching. Negative for pain, redness, visual disturbance and dryness.  Respiratory: Negative  for cough, hemoptysis, shortness of breath and difficulty breathing.   Cardiovascular: Positive for hypertension. Negative for chest pain, palpitations, irregular heartbeat and swelling in legs/feet.  Gastrointestinal: Positive for diarrhea. Negative for blood in stool and constipation.  Endocrine: Negative for increased urination.  Genitourinary: Negative for painful urination.  Musculoskeletal: Positive for arthralgias and joint pain. Negative for joint swelling, myalgias, muscle weakness, morning stiffness, muscle tenderness and myalgias.  Skin: Positive for nodules/bumps and ulcers (Bilateral feet ulcers). Negative for color change, pallor, rash, hair loss, redness, skin tightness and sensitivity to sunlight.  Allergic/Immunologic: Negative for susceptible to infections.  Neurological: Negative for dizziness, numbness and headaches.  Hematological: Negative for swollen glands.  Psychiatric/Behavioral: Negative for depressed mood and sleep disturbance. The patient is not nervous/anxious.     PMFS History:  Patient Active Problem List   Diagnosis Date Noted  . Age-related osteoporosis without current pathological fracture 11/19/2016  . Acute respiratory failure (HCC)   . Rheumatoid lung (HCC)   . Hypoxia 09/03/2016  . Essential hypertension 09/03/2016  . Urinary tract infection 09/03/2016  . 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 High risk medication use 02/28/2016  . Idiopathic chronic gout of multiple sites without tophus 02/28/2016  . Primary osteoarthritis of both knees 02/28/2016  . Flexion contractures 02/28/2016  . History of hypothyroidism 02/28/2016  . History of diabetes mellitus 02/28/2016  . History of shingles 02/28/2016  .  History of hyperlipidemia 02/28/2016    Past Medical History:  Diagnosis Date  . Complication of anesthesia   . Diabetes mellitus without complication (HCC)   . Dyspnea   . Gout   . High  cholesterol   . History of kidney stones   . Hypertension   . Hypoxia 08/2016  . Osteoarthritis   . PONV (postoperative nausea and vomiting)   . Rheumatoid arthritis (HCC)     Family History  Problem Relation Age of Onset  . COPD Brother    Past Surgical History:  Procedure Laterality Date  . ABDOMINAL HYSTERECTOMY     Social History   Social History Narrative  . Not on file     Objective: Vital Signs: BP (!) 128/92 (BP Location: Left Arm, Patient Position: Sitting, Cuff Size: Normal)   Pulse 87   Resp 16   Ht 5' (1.524 m)   Wt 150 lb (68 kg) Comment: per patient, unable to weigh  BMI 29.29 kg/m    Physical Exam  Constitutional: She is oriented to person, place, and time. She appears well-developed and well-nourished.  HENT:  Head: Normocephalic and atraumatic.  Eyes: Conjunctivae and EOM are normal.  Neck: Normal range of motion.  Cardiovascular: Normal rate, regular rhythm, normal heart sounds and intact distal pulses.  Pulmonary/Chest: Effort normal and breath sounds normal.  Abdominal: Soft. Bowel sounds are normal.  Lymphadenopathy:    She has no cervical adenopathy.  Neurological: She is alert and oriented to person, place, and time.  Skin: Skin is warm and dry. Capillary refill takes less than 2 seconds.  Psychiatric: She has a normal mood and affect. Her behavior is normal.  Nursing note and vitals reviewed.    Musculoskeletal Exam: Patient was in a wheelchair throughout the examination.  I was unable to fully assess the range of motion of her spine.  She has limitation of shoulder abduction to 90 degrees rotation with no discomfort.  She has bilateral elbow flexion contractures but no synovitis on exam.  She has complete fist formation.  She has no synovitis of MCPs.  No tenderness of MCPs.  She has subluxations of many MCPs.  She has puffiness of her right hand but no synovitis.  She has extremely limited ROM of bilateral wrists.  No tenderness or synovitis.  She has no discomfort with internal or external rotation of her hips.  No trochanteric bursa tenderness.  No SI joint tenderness.  she has some mild lumbar midline spinal tenderness.  Right knee slightly warm.  No effusion.  she has bilateral knee crepitus. Bilateral feet are in boots from podiatrist.   CDAI Exam: CDAI Homunculus Exam:   Joint Counts:  CDAI Tender Joint count: 0 CDAI Swollen Joint count: 0  Global Assessments:  Patient Global Assessment: 8 Provider Global Assessment: 8  CDAI Calculated Score: 16    Investigation: No additional findings. CBC Latest Ref Rng & Units 12/03/2016 10/15/2016 09/30/2016  WBC 3.8 - 10.8 Thousand/uL 10.5 8.9 10.1  Hemoglobin 11.7 - 15.5 g/dL 32.4 40.1 11.6(L)  Hematocrit 35.0 - 45.0 % 36.4 39.7 37.1  Platelets 140 - 400 Thousand/uL 211 217 197   CMP Latest Ref Rng & Units 12/03/2016 10/15/2016 09/30/2016  Glucose 65 - 99 mg/dL 027(O) 536(U) 440(H)  BUN 7 - 25 mg/dL 20 47(Q) 25(Z)  Creatinine 0.60 - 0.93 mg/dL 5.63 8.75 6.43  Sodium 135 - 146 mmol/L 140 140 142  Potassium 3.5 - 5.3 mmol/L 4.5 4.0 4.5  Chloride 98 -  110 mmol/L 108 107 109  CO2 20 - 32 mmol/L 19(L) 17(L) 16(L)  Calcium 8.6 - 10.4 mg/dL 9.5 9.3 8.7  Total Protein 6.1 - 8.1 g/dL 7.0 6.6 6.2  Total Bilirubin 0.2 - 1.2 mg/dL 0.4 0.5 0.5  Alkaline Phos 33 - 130 U/L - 40 36  AST 10 - 35 U/L 13 11 11   ALT 6 - 29 U/L 11 12 13     Imaging: No results found.  Speciality Comments: PLQ eye exam: 12/23/2016 Normal. [facility]. Follow up in 6 months.    Procedures:  No procedures performed Allergies: Aspirin and Codeine   Assessment / Plan:     Visit Diagnoses: Rheumatoid arthritis of multiple sites with negative rheumatoid factor (HCC): She has no synovitis on exam today.  She continues to take Arava 20 mg daily and Plaquenil 200 mg twice daily Monday through Friday.  She has noticed an improvement since starting Plaquenil in October 2018.  She has decreased morning stiffness.   She has no tenderness over her MCP joints.  She has bilateral elbow contractures and very limited range of motion of bilateral wrists.  Continue on this current treatment regimen.  She does not need any refills at this time.  High risk medication use - Arava 20 mg, PLQ 200 mg BID M-F, PLQ eye exam: 12/23/2016 Normal. [facility]. Follow up in April 2019 with ophthalmologist.  CBC and CMP were drawn today to check for drug toxicity.  She will be due for labs in May and every 3 months.  See and CMP standing orders placed today.- Plan: CBC with Differential/Platelet, COMPLETE METABOLIC PANEL WITH GFR  Idiopathic chronic gout of multiple sites without tophus -She needs to take allopurinol 100 mg twice daily daily.  She denies any recent gout flares.  Uric acid level was checked today.- Plan: Uric acid  Age-related osteoporosis without current pathological fracture -She takes Fosamax 70 mg once a week. Lumbar spine shows a T score of -3.6 done October 2017.    Flexion contractures of bilateral elbows: No synovitis on exam.   Primary osteoarthritis of both knees: She has bilateral knee crepitus on exam.  She has slight warmth and swelling of her right knee.  She is in a wheelchair due to pressure point ulcers on her bilateral feet. She states she has occasional discomfort in her right knee, but her knee pain has improved since she is not up on her knees as much.   Other medical conditions are listed as follows:  Essential hypertension  History of hypothyroidism  History of shingles  History of hyperlipidemia  History of diabetes mellitus    Orders: Orders Placed This Encounter  Procedures  . CBC with Differential/Platelet  . COMPLETE METABOLIC PANEL WITH GFR  . Uric acid   No orders of the defined types were placed in this encounter.   Face-to-face time spent with patient was 30 minutes. Greater than 50% of time was spent in counseling and coordination of care.  Follow-Up  Instructions: Return in about 5 months (around 09/04/2017) for Rheumatoid arthritis, Gout, Osteoporosis, Osteoarthritis.   Gearldine Bienenstock, PA-C  Note - This record has been created using Dragon software.  Chart creation errors have been sought, but may not always  have been located. Such creation errors do not reflect on  the standard of medical care.

## 2017-04-07 ENCOUNTER — Ambulatory Visit (INDEPENDENT_AMBULATORY_CARE_PROVIDER_SITE_OTHER): Payer: Medicare HMO | Admitting: Physician Assistant

## 2017-04-07 ENCOUNTER — Encounter: Payer: Self-pay | Admitting: Physician Assistant

## 2017-04-07 ENCOUNTER — Other Ambulatory Visit: Payer: Self-pay | Admitting: Rheumatology

## 2017-04-07 VITALS — BP 128/92 | HR 87 | Resp 16 | Ht 60.0 in | Wt 150.0 lb

## 2017-04-07 DIAGNOSIS — M2450 Contracture, unspecified joint: Secondary | ICD-10-CM | POA: Diagnosis not present

## 2017-04-07 DIAGNOSIS — I10 Essential (primary) hypertension: Secondary | ICD-10-CM | POA: Diagnosis not present

## 2017-04-07 DIAGNOSIS — Z8639 Personal history of other endocrine, nutritional and metabolic disease: Secondary | ICD-10-CM

## 2017-04-07 DIAGNOSIS — Z8619 Personal history of other infectious and parasitic diseases: Secondary | ICD-10-CM | POA: Diagnosis not present

## 2017-04-07 DIAGNOSIS — M1A09X0 Idiopathic chronic gout, multiple sites, without tophus (tophi): Secondary | ICD-10-CM

## 2017-04-07 DIAGNOSIS — Z79899 Other long term (current) drug therapy: Secondary | ICD-10-CM

## 2017-04-07 DIAGNOSIS — M810 Age-related osteoporosis without current pathological fracture: Secondary | ICD-10-CM

## 2017-04-07 DIAGNOSIS — M0609 Rheumatoid arthritis without rheumatoid factor, multiple sites: Secondary | ICD-10-CM

## 2017-04-07 DIAGNOSIS — M170 Bilateral primary osteoarthritis of knee: Secondary | ICD-10-CM

## 2017-04-07 LAB — CBC WITH DIFFERENTIAL/PLATELET
BASOS PCT: 1.3 %
Basophils Absolute: 91 cells/uL (ref 0–200)
EOS ABS: 301 {cells}/uL (ref 15–500)
Eosinophils Relative: 4.3 %
HEMATOCRIT: 33.6 % — AB (ref 35.0–45.0)
Hemoglobin: 10.8 g/dL — ABNORMAL LOW (ref 11.7–15.5)
LYMPHS ABS: 1309 {cells}/uL (ref 850–3900)
MCH: 27.8 pg (ref 27.0–33.0)
MCHC: 32.1 g/dL (ref 32.0–36.0)
MCV: 86.4 fL (ref 80.0–100.0)
MPV: 13.8 fL — AB (ref 7.5–12.5)
Monocytes Relative: 8 %
Neutro Abs: 4739 cells/uL (ref 1500–7800)
Neutrophils Relative %: 67.7 %
Platelets: 224 10*3/uL (ref 140–400)
RBC: 3.89 10*6/uL (ref 3.80–5.10)
RDW: 15 % (ref 11.0–15.0)
Total Lymphocyte: 18.7 %
WBC: 7 10*3/uL (ref 3.8–10.8)
WBCMIX: 560 {cells}/uL (ref 200–950)

## 2017-04-07 LAB — COMPLETE METABOLIC PANEL WITH GFR
AG RATIO: 1.5 (calc) (ref 1.0–2.5)
ALT: 10 U/L (ref 6–29)
AST: 14 U/L (ref 10–35)
Albumin: 4.1 g/dL (ref 3.6–5.1)
Alkaline phosphatase (APISO): 55 U/L (ref 33–130)
BUN / CREAT RATIO: 20 (calc) (ref 6–22)
BUN: 11 mg/dL (ref 7–25)
CALCIUM: 9.4 mg/dL (ref 8.6–10.4)
CO2: 22 mmol/L (ref 20–32)
Chloride: 108 mmol/L (ref 98–110)
Creat: 0.55 mg/dL — ABNORMAL LOW (ref 0.60–0.93)
GFR, EST AFRICAN AMERICAN: 107 mL/min/{1.73_m2} (ref >=60)
GFR, EST NON AFRICAN AMERICAN: 92 mL/min/{1.73_m2} (ref >=60)
GLOBULIN: 2.8 g/dL (ref 1.9–3.7)
Glucose, Bld: 69 mg/dL (ref 65–99)
POTASSIUM: 4.1 mmol/L (ref 3.5–5.3)
SODIUM: 139 mmol/L (ref 135–146)
TOTAL PROTEIN: 6.9 g/dL (ref 6.1–8.1)
Total Bilirubin: 0.4 mg/dL (ref 0.2–1.2)

## 2017-04-07 LAB — URIC ACID: Uric Acid, Serum: 5.2 mg/dL (ref 2.5–7.0)

## 2017-04-07 NOTE — Patient Instructions (Signed)
Standing Labs We placed an order today for your standing lab work.    Please come back and get your standing labs in May and every 3 months  We have open lab Monday through Friday from 8:30-11:30 AM and 1:30-4 PM at the office of Dr. Shaili Deveshwar.   The office is located at [address], [address], [place],  [address] No appointment is necessary.   Labs are drawn by [facility].  You may receive a bill from [facility] for your lab work. If you have any questions regarding directions or hours of operation,  please call [phone].    

## 2017-04-08 NOTE — Progress Notes (Signed)
Hgb and Hct low. Please notify patient and fax results to PCP. Uric acid WNL.

## 2017-04-08 NOTE — Telephone Encounter (Signed)
Last visit: 04/07/2017 Next visit: 09/14/2017 Labs: 04/07/2017 Hgb and Hct low.  Okay to refill per Dr. Corliss Skainseveshwar.

## 2017-04-09 ENCOUNTER — Encounter: Payer: Self-pay | Admitting: *Deleted

## 2017-04-21 ENCOUNTER — Ambulatory Visit: Payer: Medicare HMO | Admitting: Rheumatology

## 2017-05-10 ENCOUNTER — Encounter: Payer: Self-pay | Admitting: Pulmonary Disease

## 2017-05-10 ENCOUNTER — Ambulatory Visit: Payer: Medicare HMO | Admitting: Pulmonary Disease

## 2017-05-10 VITALS — BP 126/72 | HR 72 | Ht 60.0 in | Wt 151.0 lb

## 2017-05-10 DIAGNOSIS — R0602 Shortness of breath: Secondary | ICD-10-CM

## 2017-05-10 DIAGNOSIS — M0510 Rheumatoid lung disease with rheumatoid arthritis of unspecified site: Secondary | ICD-10-CM

## 2017-05-10 DIAGNOSIS — J849 Interstitial pulmonary disease, unspecified: Secondary | ICD-10-CM | POA: Diagnosis not present

## 2017-05-10 LAB — NITRIC OXIDE: NITRIC OXIDE: 47

## 2017-05-10 MED ORDER — FLUTICASONE FUROATE-VILANTEROL 100-25 MCG/INH IN AEPB: 1.0000 | INHALATION_SPRAY | Freq: Every day | RESPIRATORY_TRACT | 3 refills | Status: DC

## 2017-05-10 MED ORDER — FLUTICASONE FUROATE-VILANTEROL 100-25 MCG/INH IN AEPB: 1.0000 | INHALATION_SPRAY | Freq: Every day | RESPIRATORY_TRACT | 0 refills | Status: AC

## 2017-05-10 NOTE — Progress Notes (Signed)
Manual MeierGaynelle [name]    [id number]    Sep 27, 1940  Primary Care Physician:[name], Mary Routegina F, NP  Referring Physician: Dema SeverinYork, Mary F, NP [address] [place], [place] [id number]  Chief complaint:   Follow up for RA-ILD  HPI: 77 year old with history of rheumatoid arthritis, diabetes, gout, hypertension, hyperlipidemia and hypothyroidism. She is being followed by Dr. Corliss Skainseveshwar for management of rheumatoid arthritis. She had been previously maintained on methotrexate. She was taken off methotrexate with plans to transition to leflunomide. Before initiation of leflunomide she was hospitalized July 2018 with respiratory failure, bilateral lung infiltrates. CT at that time did not show any pulmonary embolus but there are diffuse ground glass opacities concerning for interstitial lung disease or edema.  She was started on prednisone and diuresed with improvement in symptoms. Bronchoscopy was considered but deferred due to improvement in respiratory status. She has been discharged on a prednisone taper and has followed up with Dr. Corliss Skainseveshwar and is currently on [place].  Interim History:. She is now currently of prednisone and reports worsening dyspnea with cough, nonproductive in nature, occasional wheezing.  Outpatient Encounter Medications as of 05/10/2017  Medication Sig  . acetaminophen (TYLENOL) 500 MG tablet   . alendronate (FOSAMAX) 70 MG tablet TAKE 1 TABLET WEEKLY, SAME TIME EACH WEEK. TAKE ON EMPTY STOMACH WITHFULL GLASS OF WATER. DON'T LIE DOWN 30 MIN.  Marland Kitchen. allopurinol (ZYLOPRIM) 100 MG tablet Take 100 mg by mouth 2 (two) times daily.   Marland Kitchen. atorvastatin (LIPITOR) 10 MG tablet Take 10 mg by mouth daily.  . benazepril (LOTENSIN) 20 MG tablet Take 1 tablet (20 mg total) by mouth daily.  . carvedilol (COREG) 6.25 MG tablet Take 6.25 mg by mouth 2 (two) times daily with a meal.   . Cholecalciferol (VITAMIN D3) 5000 units TABS Take 1 tablet by mouth. Takes on Monday, Wednesday, and Friday.  .  clorazepate (TRANXENE) 7.5 MG tablet Take 7.5 mg by mouth 2 (two) times daily.   . folic acid (FOLVITE) 1 MG tablet Take 2 tablets (2 mg total) by mouth daily.  Marland Kitchen. glipiZIDE (GLUCOTROL) 5 MG tablet   . hydroxychloroquine (PLAQUENIL) 200 MG tablet 200 mg twice a day, Monday through Friday only; none on Saturday or Sunday  . leflunomide (ARAVA) 20 MG tablet TAKE ONE TABLET BY MOUTH ONCE DAILY (LABS DUE NOVEMBER 15)  . levothyroxine (SYNTHROID, LEVOTHROID) 75 MCG tablet Take 75 mcg by mouth daily.   . metFORMIN (GLUCOPHAGE) 500 MG tablet Take 500 mg by mouth 2 (two) times daily with a meal.   . [DISCONTINUED] predniSONE (DELTASONE) 5 MG tablet Take 1 tab daily x2 weeks, then 1/2 tab daily x2 weeks, then stop  . glipiZIDE (GLUCOTROL) 5 MG tablet Take 1 tablet (5 mg total) by mouth daily before breakfast.   No facility-administered encounter medications on file as of 05/10/2017.     Allergies as of 05/10/2017 - Review Complete 05/10/2017  Allergen Reaction Noted  . Aspirin Other (See Comments) 04/19/2015  . Codeine Nausea Only 04/19/2015    Past Medical History:  Diagnosis Date  . Complication of anesthesia   . Diabetes mellitus without complication (HCC)   . Dyspnea   . Gout   . High cholesterol   . History of kidney stones   . Hypertension   . Hypoxia 08/2016  . Osteoarthritis   . PONV (postoperative nausea and vomiting)   . Rheumatoid arthritis [facility](HCC)     Past Surgical History:  Procedure Laterality Date  .  ABDOMINAL HYSTERECTOMY      Family History  Problem Relation Age of Onset  . COPD Brother     Social History   Socioeconomic History  . Marital status: Married    Spouse name: Not on file  . Number of children: Not on file  . Years of education: Not on file  . Highest education level: Not on file  Social Needs  . Financial resource strain: Not on file  . Food insecurity - worry: Not on file  . Food insecurity - inability: Not on file  . Transportation needs -  medical: Not on file  . Transportation needs - non-medical: Not on file  Occupational History  . Not on file  Tobacco Use  . Smoking status: Never Smoker  . Smokeless tobacco: Never Used  Substance and Sexual Activity  . Alcohol use: No  . Drug use: No  . Sexual activity: Not on file  Other Topics Concern  . Not on file  Social History Narrative  . Not on file    Review of systems: Review of Systems  Constitutional: Negative for fever and chills.  HENT: Negative.   Eyes: Negative for blurred vision.  Respiratory: as per HPI  Cardiovascular: Negative for chest pain and palpitations.  Gastrointestinal: Negative for vomiting, diarrhea, blood per rectum. Genitourinary: Negative for dysuria, urgency, frequency and hematuria.  Musculoskeletal: Negative for myalgias, back pain and joint pain.  Skin: Negative for itching and rash.  Neurological: Negative for dizziness, tremors, focal weakness, seizures and loss of consciousness.  Endo/Heme/Allergies: Negative for environmental allergies.  Psychiatric/Behavioral: Negative for depression, suicidal ideas and hallucinations.  All other systems reviewed and are negative.  Physical Exam: Blood pressure 126/72, pulse 72, height 5' (1.524 m), weight 151 lb (68.5 kg), SpO2 94 %. Gen:      No acute distress HEENT:  EOMI, sclera anicteric Neck:     No masses; no thyromegaly Lungs:    Scattered wheeze; normal respiratory effort CV:         Regular rate and rhythm; no murmurs Abd:      + bowel sounds; soft, non-tender; no palpable masses, no distension Ext:    No edema; adequate peripheral perfusion Skin:      Warm and dry; no rash Neuro: alert and oriented x 3 Psych: normal mood and affect  Data Reviewed: CTA chest 09/03/16- Negative for pulmonary embolus. Extensive bilateral ground-glass opacities, bilateral hilar and mediastinal lymph nodes. Calcific aortic and coronary atherosclerosis. Hiatal hernia.  Chest x-ray 09/07/16-improvement in  interstitial opacities. Chest x-ray 10/29/16-stable interstitial opacities I reviewed her images personally  FENO 05/12/17-47  PFTs-unable to obtain.  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  Continues on Plaquenil and Arava per her rheumatologist She'll need a high-resolution CT if symptoms worsen again.  Dyspnea, wheezing As the prednisone has been tapered off she has developed some wheezing.  I suspect she may have mild asthma as FENO is high.. We will start her on Breo inhaler  Plan/Recommendations: - Start Breo 100/25  Follow up in 3 months  Chilton Greathouse MD [facility] Pager [phone] 05/10/2017, 11:05 AM  CC: Mary Severin, NP  Pollyann Savoy MD

## 2017-05-10 NOTE — Patient Instructions (Signed)
I am glad you are doing well after the prednisone has been stopped I believe you may have mild asthma as status more inflammation and congestion. We will try you on Breo 100 inhaler Follow-up in 3 months.

## 2017-06-08 ENCOUNTER — Other Ambulatory Visit (INDEPENDENT_AMBULATORY_CARE_PROVIDER_SITE_OTHER): Payer: Self-pay | Admitting: Rheumatology

## 2017-06-08 NOTE — Telephone Encounter (Signed)
Last visit: 04/07/2017 Next visit: 09/14/2017 Labs: 04/07/2017 Hgb and Hct low.  Okay to refill per Dr. Corliss Skainseveshwar.

## 2017-07-12 ENCOUNTER — Other Ambulatory Visit: Payer: Self-pay | Admitting: Rheumatology

## 2017-07-12 NOTE — Progress Notes (Signed)
Office Visit Note  Patient: Mary [name]             Date of Birth: 05-14-1940           MRN: [id number]             PCP: Dema Severin, NP Referring: Dema Severin, NP Visit Date: 07/14/2017 Occupation: @    Subjective:  Right knee pain    History of Present Illness: Mary [name] is a 76 y.o. female with history of seronegative rheumatoid arthritis, gout, osteoporosis, and osteoarthritis.  She continues to take Arava 20 mg daily and Plaquenil 200 mg twice daily Monday through Friday.  She reports that for 1 week she was taking Plaquenil 1 tablet by mouth daily due to the instructions on her bottle being incorrect.  She states that after this she started having increased pain and swelling in her right knee.  She states that she feels that she has instability and weakness in her right leg.  She states that she had a fall recently which she feels is due to her right knee pain and instability.  She denies any mechanical symptoms.  She denies any increased pain or swelling in any other joints.  She denies any recent gout flares.  She continues to take allopurinol 200 mg daily.   Activities of Daily Living:  Patient reports morning stiffness for 5 minutes.   Patient Denies nocturnal pain.  Difficulty dressing/grooming: Denies Difficulty climbing stairs: Reports Difficulty getting out of chair: Reports Difficulty using hands for taps, buttons, cutlery, and/or writing: Reports   Review of Systems  Constitutional: Positive for fatigue.  HENT: Negative for mouth sores, mouth dryness and nose dryness.   Eyes: Negative for pain, visual disturbance and dryness.  Respiratory: Negative for cough, hemoptysis, shortness of breath and difficulty breathing.   Cardiovascular: Negative for chest pain, palpitations, hypertension and swelling in legs/feet.  Gastrointestinal: Negative for blood in stool, constipation and diarrhea.  Endocrine: Negative for increased urination.    Genitourinary: Negative for painful urination.  Musculoskeletal: Positive for arthralgias, joint pain, joint swelling, muscle weakness and morning stiffness. Negative for myalgias, muscle tenderness and myalgias.  Skin: Negative for color change, pallor, rash, hair loss, nodules/bumps, skin tightness, ulcers and sensitivity to sunlight.  Allergic/Immunologic: Negative for susceptible to infections.  Neurological: Negative for dizziness, numbness, headaches and weakness.  Hematological: Negative for swollen glands.  Psychiatric/Behavioral: Negative for depressed mood and sleep disturbance. The patient is not nervous/anxious.     PMFS History:  Patient Active Problem List   Diagnosis Date Noted  . Age-related osteoporosis without current pathological fracture 11/19/2016  . Acute respiratory failure (HCC)   . Rheumatoid lung (HCC)   . Hypoxia 09/03/2016  . Essential hypertension 09/03/2016  . Urinary tract infection 09/03/2016  . 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 High risk medication use 02/28/2016  . Idiopathic chronic gout of multiple sites without tophus 02/28/2016  . Primary osteoarthritis of both knees 02/28/2016  . Flexion contractures 02/28/2016  . History of hypothyroidism 02/28/2016  . History of diabetes mellitus 02/28/2016  . History of shingles 02/28/2016  . History of hyperlipidemia 02/28/2016    Past Medical History:  Diagnosis Date  . Complication of anesthesia   . Diabetes mellitus without complication (HCC)   . Dyspnea   . Gout   . High cholesterol   . History of kidney stones   . Hypertension   . Hypoxia 08/2016  .  Osteoarthritis   . PONV (postoperative nausea and vomiting)   . Rheumatoid arthritis (HCC)     Family History  Problem Relation Age of Onset  . Arthritis Brother   . Arthritis Sister   . Arthritis Sister   . Arthritis Brother   . Arthritis Brother   . Arthritis Brother    Past  Surgical History:  Procedure Laterality Date  . ABDOMINAL HYSTERECTOMY     Social History   Social History Narrative  . Not on file     Objective: Vital Signs: BP 140/88 (BP Location: Left Arm, Patient Position: Sitting, Cuff Size: Normal)   Pulse 74   Resp 16   Ht 5' (1.524 m)   Wt 150 lb (68 kg) Comment: per patient, unable to weigh  BMI 29.29 kg/m    Physical Exam  Constitutional: She is oriented to person, place, and time. She appears well-developed and well-nourished.  HENT:  Head: Normocephalic and atraumatic.  Eyes: Conjunctivae and EOM are normal.  Neck: Normal range of motion.  Cardiovascular: Normal rate, regular rhythm, normal heart sounds and intact distal pulses.  Pulmonary/Chest: Effort normal and breath sounds normal.  Abdominal: Soft. Bowel sounds are normal.  Lymphadenopathy:    She has no cervical adenopathy.  Neurological: She is alert and oriented to person, place, and time.  Skin: Skin is warm and dry. Capillary refill takes less than 2 seconds.  Psychiatric: She has a normal mood and affect. Her behavior is normal.  Nursing note and vitals reviewed.    Musculoskeletal Exam: C-spine limited range of motion.  Unable to fully assess thoracic and lumbar range of motion due to patient being in wheelchair.  No midline spinal tenderness.  No SI joint tenderness.  She has limited shoulder abduction to 90 degrees bilaterally.  She has bilateral elbow contractures but no synovitis.  She has no synovitis or tenderness of MCPs.  She has subluxations of MCPs.  She has edema in bilateral hands, worse in right hand.  She has very limited ROM of bilateral wrist joints but no tenderness of synovitis.  She has warmth and a small effusion of right knee joint.  Bilateral knee crepitus.  She is wearing diabetic shoes bilaterally.    CDAI Exam: CDAI Homunculus Exam:   Joint Counts:  CDAI Tender Joint count: 0 CDAI Swollen Joint count: 0  Global Assessments:  Patient  Global Assessment: 7 Provider Global Assessment: 7  CDAI Calculated Score: 14    Investigation: No additional findings. CBC Latest Ref Rng & Units 04/07/2017 12/03/2016 10/15/2016  WBC 3.8 - 10.8 Thousand/uL 7.0 10.5 8.9  Hemoglobin 11.7 - 15.5 g/dL 10.8(L) 11.8 12.7  Hematocrit 35.0 - 45.0 % 33.6(L) 36.4 39.7  Platelets 140 - 400 Thousand/uL 224 211 217   CMP Latest Ref Rng & Units 04/07/2017 12/03/2016 10/15/2016  Glucose 65 - 99 mg/dL 69 784(O) 962(X)  BUN 7 - 25 mg/dL 11 20 52(W)  Creatinine 0.60 - 0.93 mg/dL 4.13(K) 4.40 1.02  Sodium 135 - 146 mmol/L 139 140 140  Potassium 3.5 - 5.3 mmol/L 4.1 4.5 4.0  Chloride 98 - 110 mmol/L 108 108 107  CO2 20 - 32 mmol/L 22 19(L) 17(L)  Calcium 8.6 - 10.4 mg/dL 9.4 9.5 9.3  Total Protein 6.1 - 8.1 g/dL 6.9 7.0 6.6  Total Bilirubin 0.2 - 1.2 mg/dL 0.4 0.4 0.5  Alkaline Phos 33 - 130 U/L - - 40  AST 10 - 35 U/L ALT 6 - 29  U/L Imaging: No results found.  Speciality Comments: PLQ eye exam: 12/23/2016 Normal. [facility]. Follow up in 6 months.    Procedures:  Large Joint Inj: R knee on 07/14/2017 11:15 AM Indications: pain Details: 27 G 1.5 in needle, medial approach  Arthrogram: No  Medications: 1.5 mL lidocaine 1 %; 40 mg triamcinolone acetonide 40 MG/ML Aspirate: 0 mL Outcome: tolerated well, no immediate complications Procedure, treatment alternatives, risks and benefits explained, specific risks discussed. Consent was given by the patient. Immediately prior to procedure a time out was called to verify the correct patient, procedure, equipment, support staff and site/side marked as required. Patient was prepped and draped in the usual sterile fashion.     Allergies: Aspirin and Codeine   Assessment / Plan:     Visit Diagnoses: Rheumatoid arthritis of multiple sites with negative rheumatoid factor (HCC): She has no active synovitis on exam today.  She does have some warmth and swelling of her right knee.   I performed a cortisone injection today in the office.  She tolerated the procedure well.  She will continue taking Arava 20 mg by mouth daily and Plaquenil 200 mg twice daily Monday through Friday.  She continues to have chronic bilateral elbow contractures and very limited range of motion of bilateral wrists.  Not need any refills of her medications at this time.  High risk medication use - Arava 20 mg, PLQ 200 mg BID M-F, PLQ eye exam: 12/23/2016 Normal. [facility]. Follow up in April 2019 with ophthalmologistCBC and CMP 04/07/17 -CBC and CMP will be drawn today to monitor for drug toxicity.  She will return in August and every 3 months for lab work.  Plan: CBC with Differential/Platelet, COMPLETE METABOLIC PANEL WITH GFR  Flexion contractures: Chronic.  On exam today.  Idiopathic chronic gout of multiple sites without tophus -she has not had any recent gout flares.  She continues to take allopurinol 100 mg twice daily.  Her uric acid was 5.212 619.  We will recheck her uric acid with her next lab work.    Age-related osteoporosis without current pathological fracture - She takes Fosamax 70 mg once a week. Lumbar spine shows a T score of -3.6 done October 2017.    Primary osteoarthritis of both knees: She has limited range of motion of bilateral knees with bilateral knee crepitus.  She has warmth and a small effusion of her right knee. I  performed a cortisone injection of her right knee today in the office.  A referral to physical therapy will be placed today to work on lower extremity muscle weakness and gait stability.  Chronic pain of right knee -she is having increased pain in her right knee.  She is also having some instability.  She is having warmth and effusion as well as knee crepitus in the right knee.  A referral be placed for physical therapy.  A cortisone injection was performed today in the office.  She tolerated the procedure well.  She was given a handout of knee exercises that she  can perform at home.  Plan: XR KNEE 3 VIEW RIGHT   Essential hypertension: She is advised to monitor her blood pressure closely following the cortisone injection today.  History of hypothyroidism  History of hyperlipidemia  History of diabetes mellitus: He was advised to monitor blood glucose level closely upon the cortisone injection today.  History of shingles  History of kidney stones  Anemia, unspecified type -  PCP requested labs for anemia to be checked today.  Plan: Iron, TIBC and Ferritin Panel, Retic, Folate, Vitamin B12     Orders: Orders Placed This Encounter  Procedures  . Large Joint Inj  . XR KNEE 3 VIEW RIGHT  . Iron, TIBC and Ferritin Panel  . Retic  . Folate  . Vitamin B12  . CBC with Differential/Platelet  . COMPLETE METABOLIC PANEL WITH GFR   No orders of the defined types were placed in this encounter.   Face-to-face time spent with patient was 30 minutes. >50% of time was spent in counseling and coordination of care.  Follow-Up Instructions: Return in about 5 months (around 12/14/2017) for Rheumatoid arthritis, Gout, Osteoporosis, Osteoarthritis.   Gearldine Bienenstock, PA-C  Note - This record has been created using Dragon software.  Chart creation errors have been sought, but may not always  have been located. Such creation errors do not reflect on  the standard of medical care.

## 2017-07-12 NOTE — Telephone Encounter (Addendum)
Last visit: 04/07/2017 Next visit: 09/14/2017 Labs: 04/07/2017 Hgb and Hct low.  Patient advised she is due to update labs. Will update 07/14/17   Okay to refill 30 day per Dr. Corliss Skains

## 2017-07-14 ENCOUNTER — Encounter: Payer: Self-pay | Admitting: Physician Assistant

## 2017-07-14 ENCOUNTER — Ambulatory Visit: Payer: Medicare HMO | Admitting: Physician Assistant

## 2017-07-14 ENCOUNTER — Ambulatory Visit (INDEPENDENT_AMBULATORY_CARE_PROVIDER_SITE_OTHER): Payer: Medicare HMO

## 2017-07-14 VITALS — BP 140/88 | HR 74 | Resp 16 | Ht 60.0 in | Wt 150.0 lb

## 2017-07-14 DIAGNOSIS — D649 Anemia, unspecified: Secondary | ICD-10-CM | POA: Diagnosis not present

## 2017-07-14 DIAGNOSIS — M0609 Rheumatoid arthritis without rheumatoid factor, multiple sites: Secondary | ICD-10-CM

## 2017-07-14 DIAGNOSIS — M170 Bilateral primary osteoarthritis of knee: Secondary | ICD-10-CM

## 2017-07-14 DIAGNOSIS — M2450 Contracture, unspecified joint: Secondary | ICD-10-CM

## 2017-07-14 DIAGNOSIS — Z87442 Personal history of urinary calculi: Secondary | ICD-10-CM | POA: Diagnosis not present

## 2017-07-14 DIAGNOSIS — Z79899 Other long term (current) drug therapy: Secondary | ICD-10-CM | POA: Diagnosis not present

## 2017-07-14 DIAGNOSIS — G8929 Other chronic pain: Secondary | ICD-10-CM

## 2017-07-14 DIAGNOSIS — M25561 Pain in right knee: Secondary | ICD-10-CM

## 2017-07-14 DIAGNOSIS — M1A09X0 Idiopathic chronic gout, multiple sites, without tophus (tophi): Secondary | ICD-10-CM | POA: Diagnosis not present

## 2017-07-14 DIAGNOSIS — Z8619 Personal history of other infectious and parasitic diseases: Secondary | ICD-10-CM | POA: Diagnosis not present

## 2017-07-14 DIAGNOSIS — Z8639 Personal history of other endocrine, nutritional and metabolic disease: Secondary | ICD-10-CM

## 2017-07-14 DIAGNOSIS — M810 Age-related osteoporosis without current pathological fracture: Secondary | ICD-10-CM | POA: Diagnosis not present

## 2017-07-14 DIAGNOSIS — I10 Essential (primary) hypertension: Secondary | ICD-10-CM | POA: Diagnosis not present

## 2017-07-14 MED ORDER — TRIAMCINOLONE ACETONIDE 40 MG/ML IJ SUSP
40.0000 mg | INTRAMUSCULAR | Status: AC | PRN
Start: 2017-07-14 — End: 2017-07-14
  Administered 2017-07-14: 40 mg via INTRA_ARTICULAR

## 2017-07-14 MED ORDER — LIDOCAINE HCL 1 % IJ SOLN
1.5000 mL | INTRAMUSCULAR | Status: AC | PRN
  Administered 2017-07-14: 1.5 mL

## 2017-07-14 NOTE — Patient Instructions (Signed)
Standing Labs We placed an order today for your standing lab work.    Please come back and get your standing labs in August and every 3 months   We have open lab Monday through Friday from 8:30-11:30 AM and 1:30-4:00 PM  at the office of Dr. Shaili Deveshwar.   You may experience shorter wait times on Monday and Friday afternoons. The office is located at [address], [address], [place], [place] [address] No appointment is necessary.   Labs are drawn by [facility].  You may receive a bill from [facility] for your lab work. If you have any questions regarding directions or hours of operation,  please call [phone].    

## 2017-07-20 ENCOUNTER — Telehealth: Payer: Self-pay | Admitting: Rheumatology

## 2017-07-20 DIAGNOSIS — M0609 Rheumatoid arthritis without rheumatoid factor, multiple sites: Secondary | ICD-10-CM

## 2017-07-20 DIAGNOSIS — Z79899 Other long term (current) drug therapy: Secondary | ICD-10-CM

## 2017-07-20 NOTE — Telephone Encounter (Signed)
Patient's daughter [name] called requesting labwork order to be sent to [facility] in [place].  Patient will be going tomorrow morning.

## 2017-07-20 NOTE — Telephone Encounter (Signed)
Patient's daughter has been advised that lab orders have been placed and released for [facility].

## 2017-07-22 LAB — IRON,TIBC AND FERRITIN PANEL
FERRITIN: 142 ng/mL (ref 15–150)
IRON SATURATION: 17 % (ref 15–55)
IRON: 54 ug/dL (ref 27–139)
Total Iron Binding Capacity: 313 ug/dL (ref 250–450)
UIBC: 259 ug/dL (ref 118–369)

## 2017-07-22 LAB — CBC WITH DIFFERENTIAL/PLATELET
BASOS: 1 %
Basophils Absolute: 0.1 10*3/uL (ref 0.0–0.2)
EOS (ABSOLUTE): 0.4 10*3/uL (ref 0.0–0.4)
EOS: 5 %
HEMATOCRIT: 32.7 % — AB (ref 34.0–46.6)
Hemoglobin: 10.2 g/dL — ABNORMAL LOW (ref 11.1–15.9)
IMMATURE GRANS (ABS): 0 10*3/uL (ref 0.0–0.1)
Immature Granulocytes: 0 %
LYMPHS: 22 %
Lymphocytes Absolute: 1.6 10*3/uL (ref 0.7–3.1)
MCH: 28.4 pg (ref 26.6–33.0)
MCHC: 31.2 g/dL — ABNORMAL LOW (ref 31.5–35.7)
MCV: 91 fL (ref 79–97)
Monocytes Absolute: 0.7 10*3/uL (ref 0.1–0.9)
Monocytes: 10 %
NEUTROS ABS: 4.6 10*3/uL (ref 1.4–7.0)
Neutrophils: 62 %
Platelets: 265 10*3/uL (ref 150–450)
RBC: 3.59 x10E6/uL — ABNORMAL LOW (ref 3.77–5.28)
RDW: 18.3 % — ABNORMAL HIGH (ref 12.3–15.4)
WBC: 7.4 10*3/uL (ref 3.4–10.8)

## 2017-07-22 LAB — CMP14+EGFR
ALK PHOS: 68 IU/L (ref 39–117)
ALT: 9 IU/L (ref 0–32)
AST: 16 IU/L (ref 0–40)
Albumin/Globulin Ratio: 1.5 (ref 1.2–2.2)
Albumin: 4.1 g/dL (ref 3.5–4.8)
BUN/Creatinine Ratio: 28 (ref 12–28)
BUN: 21 mg/dL (ref 8–27)
Bilirubin Total: 0.3 mg/dL (ref 0.0–1.2)
CO2: 18 mmol/L — AB (ref 20–29)
CREATININE: 0.75 mg/dL (ref 0.57–1.00)
Calcium: 9.3 mg/dL (ref 8.7–10.3)
Chloride: 106 mmol/L (ref 96–106)
GFR calc Af Amer: 90 mL/min/{1.73_m2} (ref >59)
GFR calc non Af Amer: 78 mL/min/{1.73_m2} (ref >59)
GLOBULIN, TOTAL: 2.7 g/dL (ref 1.5–4.5)
GLUCOSE: 61 mg/dL — AB (ref 65–99)
Potassium: 4.6 mmol/L (ref 3.5–5.2)
SODIUM: 141 mmol/L (ref 134–144)
Total Protein: 6.8 g/dL (ref 6.0–8.5)

## 2017-07-22 LAB — RETICULOCYTES: Retic Ct Pct: 2.6 % (ref 0.6–2.6)

## 2017-07-22 LAB — FOLATE

## 2017-07-22 LAB — VITAMIN B12: Vitamin B-12: 491 pg/mL (ref 232–1245)

## 2017-07-22 NOTE — Telephone Encounter (Signed)
Labs are stable.

## 2017-08-03 ENCOUNTER — Telehealth: Payer: Self-pay | Admitting: Rheumatology

## 2017-08-03 MED ORDER — HYDROXYCHLOROQUINE SULFATE 200 MG PO TABS: ORAL_TABLET | ORAL | 2 refills | Status: DC

## 2017-08-03 NOTE — Telephone Encounter (Signed)
Last visit: 07/14/17 Next visit: 12/14/17 Labs: 07/21/17 stable PLQ eye exam: 12/23/2016 Normal  Okay to refill per Dr. Corliss Skainseveshwar

## 2017-08-03 NOTE — Telephone Encounter (Signed)
Patient called requesting prescription refill of Hydroxychloroquine to be [facility] to [facility] [place].

## 2017-08-04 ENCOUNTER — Telehealth: Payer: Self-pay | Admitting: Rheumatology

## 2017-08-04 NOTE — Telephone Encounter (Signed)
Please call Jae DireKate with [facility] to approve the approve plan.

## 2017-08-04 NOTE — Telephone Encounter (Signed)
Jae DireKate with [facility] left a message requesting verbal orders for 1x wk/ for 1wk then 2x wk/ for 8wks. Please call with verbal okay.

## 2017-08-04 NOTE — Telephone Encounter (Signed)
Spoke with Jae DireKate and gave approval for PT.

## 2017-08-11 ENCOUNTER — Other Ambulatory Visit: Payer: Self-pay | Admitting: Rheumatology

## 2017-08-11 NOTE — Telephone Encounter (Signed)
Last visit: 07/14/17 Next visit: 12/14/17 Labs: 07/21/17 stable  Okay to refill per Dr. Deveshwar 

## 2017-08-17 ENCOUNTER — Ambulatory Visit (INDEPENDENT_AMBULATORY_CARE_PROVIDER_SITE_OTHER)
Admission: RE | Admit: 2017-08-17 | Discharge: 2017-08-17 | Disposition: A | Discharge status: Home / Self Care | Payer: Medicare HMO | Source: Ambulatory Visit | Attending: Pulmonary Disease | Admitting: Pulmonary Disease

## 2017-08-17 ENCOUNTER — Other Ambulatory Visit (INDEPENDENT_AMBULATORY_CARE_PROVIDER_SITE_OTHER): Payer: Medicare HMO

## 2017-08-17 ENCOUNTER — Other Ambulatory Visit: Payer: Medicare HMO

## 2017-08-17 ENCOUNTER — Ambulatory Visit: Payer: Medicare HMO | Admitting: Pulmonary Disease

## 2017-08-17 ENCOUNTER — Encounter: Payer: Self-pay | Admitting: Pulmonary Disease

## 2017-08-17 VITALS — BP 132/72 | HR 88 | Ht 60.0 in | Wt 150.0 lb

## 2017-08-17 DIAGNOSIS — M0510 Rheumatoid lung disease with rheumatoid arthritis of unspecified site: Secondary | ICD-10-CM

## 2017-08-17 DIAGNOSIS — J849 Interstitial pulmonary disease, unspecified: Secondary | ICD-10-CM

## 2017-08-17 LAB — CBC WITH DIFFERENTIAL/PLATELET
BASOS ABS: 0.1 10*3/uL (ref 0.0–0.1)
Basophils Relative: 1 % (ref 0.0–3.0)
EOS ABS: 0.4 10*3/uL (ref 0.0–0.7)
Eosinophils Relative: 6 % — ABNORMAL HIGH (ref 0.0–5.0)
HEMATOCRIT: 32 % — AB (ref 36.0–46.0)
Hemoglobin: 10.5 g/dL — ABNORMAL LOW (ref 12.0–15.0)
LYMPHS ABS: 1.4 10*3/uL (ref 0.7–4.0)
LYMPHS PCT: 19.5 % (ref 12.0–46.0)
MCHC: 32.8 g/dL (ref 30.0–36.0)
MCV: 94.1 fl (ref 78.0–100.0)
MONOS PCT: 10.5 % (ref 3.0–12.0)
Monocytes Absolute: 0.7 10*3/uL (ref 0.1–1.0)
NEUTROS ABS: 4.4 10*3/uL (ref 1.4–7.7)
NEUTROS PCT: 63 % (ref 43.0–77.0)
PLATELETS: 224 10*3/uL (ref 150.0–400.0)
RBC: 3.4 Mil/uL — ABNORMAL LOW (ref 3.87–5.11)
RDW: 16.8 % — ABNORMAL HIGH (ref 11.5–15.5)
WBC: 7 10*3/uL (ref 4.0–10.5)

## 2017-08-17 MED ORDER — ARFORMOTEROL TARTRATE 15 MCG/2ML IN NEBU: 15.0000 ug | INHALATION_SOLUTION | Freq: Two times a day (BID) | RESPIRATORY_TRACT | 12 refills | Status: DC

## 2017-08-17 MED ORDER — BUDESONIDE 0.5 MG/2ML IN SUSP: 0.5000 mg | Freq: Two times a day (BID) | RESPIRATORY_TRACT | 12 refills | Status: DC

## 2017-08-17 NOTE — Patient Instructions (Signed)
We will get a repeat chest x-ray today to monitor your lungs Check CBC with differential, blood allergy profile, alpha-1 antitrypsin levels and phenotype We will stop the Breo and start you on Brovana, Pulmicort nebulizers twice daily Follow-up in 3 months.

## 2017-08-17 NOTE — Progress Notes (Addendum)
Mary [name]    [id number]    1940-12-11  Primary Care Physician:York, Orie Routegina F, NP  Referring Physician: Dema SeverinYork, Regina F, NP [address] [place], [place] [id number]  Chief complaint:   Follow up for RA-ILD  HPI: 77 year old with history of rheumatoid arthritis, diabetes, gout, hypertension, hyperlipidemia and hypothyroidism. She is being followed by Dr. Corliss Skainseveshwar for management of rheumatoid arthritis. She had been previously maintained on methotrexate. She was taken off methotrexate with plans to transition to leflunomide. Before initiation of leflunomide she was hospitalized July 2018 with respiratory failure, bilateral lung infiltrates. CT at that time did not show any pulmonary embolus but there are diffuse ground glass opacities concerning for interstitial lung disease or edema.  She was started on prednisone and diuresed with improvement in symptoms. Bronchoscopy was considered but deferred due to improvement in respiratory status. She has been discharged on a prednisone taper and has followed up with Dr. Corliss Skainseveshwar and is currently on [place].  Interim History:. States that breathing is at baseline.  Continues on Arava and Plaquenil per rheumatology.  Outpatient Encounter Medications as of 08/17/2017  Medication Sig  . acetaminophen (TYLENOL) 500 MG tablet as needed.   Marland Kitchen. alendronate (FOSAMAX) 70 MG tablet TAKE 1 TABLET WEEKLY, SAME TIME EACH WEEK. TAKE ON EMPTY STOMACH WITHFULL GLASS OF WATER. DON'T LIE DOWN 30 MIN.  Marland Kitchen. allopurinol (ZYLOPRIM) 100 MG tablet Take 100 mg by mouth 2 (two) times daily.   Marland Kitchen. atorvastatin (LIPITOR) 10 MG tablet Take 10 mg by mouth daily.  . benazepril (LOTENSIN) 20 MG tablet Take 1 tablet (20 mg total) by mouth daily.  . carvedilol (COREG) 6.25 MG tablet Take 6.25 mg by mouth 2 (two) times daily with a meal.   . Cholecalciferol (VITAMIN D3) 5000 units TABS Take 1 tablet by mouth. Takes on Monday, Wednesday, and Friday.  . clorazepate (TRANXENE) 7.5  MG tablet Take 7.5 mg by mouth 2 (two) times daily.   . fluticasone furoate-vilanterol (BREO ELLIPTA) 100-25 MCG/INH AEPB Inhale 1 puff into the lungs daily. (Patient taking differently: Inhale 1 puff into the lungs as needed. )  . glipiZIDE (GLUCOTROL) 5 MG tablet   . hydroxychloroquine (PLAQUENIL) 200 MG tablet 200 mg twice a day, Monday through Friday only; none on Saturday or Sunday  . leflunomide (ARAVA) 20 MG tablet TAKE 1 TABLET BY MOUTH DAILY  . levothyroxine (SYNTHROID, LEVOTHROID) 75 MCG tablet Take 75 mcg by mouth daily.   . metFORMIN (GLUCOPHAGE) 500 MG tablet Take 500 mg by mouth 2 (two) times daily with a meal.   . [DISCONTINUED] glipiZIDE (GLUCOTROL) 5 MG tablet Take 1 tablet (5 mg total) by mouth daily before breakfast.   No facility-administered encounter medications on file as of 08/17/2017.     Allergies as of 08/17/2017 - Review Complete 07/14/2017  Allergen Reaction Noted  . Aspirin Other (See Comments) 04/19/2015  . Codeine Nausea Only 04/19/2015    Past Medical History:  Diagnosis Date  . Complication of anesthesia   . Diabetes mellitus without complication (HCC)   . Dyspnea   . Gout   . High cholesterol   . History of kidney stones   . Hypertension   . Hypoxia 08/2016  . Osteoarthritis   . PONV (postoperative nausea and vomiting)   . Rheumatoid arthritis [facility](HCC)     Past Surgical History:  Procedure Laterality Date  . ABDOMINAL HYSTERECTOMY      Family History  Problem Relation Age of  Onset  . Arthritis Brother   . Arthritis Sister   . Arthritis Sister   . Arthritis Brother   . Arthritis Brother   . Arthritis Brother     Social History   Socioeconomic History  . Marital status: Married    Spouse name: Not on file  . Number of children: Not on file  . Years of education: Not on file  . Highest education level: Not on file  Occupational History  . Not on file  Social Needs  . Financial resource strain: Not on file  . Food insecurity:     Worry: Not on file    Inability: Not on file  . Transportation needs:    Medical: Not on file    Non-medical: Not on file  Tobacco Use  . Smoking status: Never Smoker  . Smokeless tobacco: Never Used  Substance and Sexual Activity  . Alcohol use: No  . Drug use: Never  . Sexual activity: Not on file  Lifestyle  . Physical activity:    Days per week: Not on file    Minutes per session: Not on file  . Stress: Not on file  Relationships  . Social connections:    Talks on phone: Not on file    Gets together: Not on file    Attends religious service: Not on file    Active member of club or organization: Not on file    Attends meetings of clubs or organizations: Not on file    Relationship status: Not on file  . Intimate partner violence:    Fear of current or ex partner: Not on file    Emotionally abused: Not on file    Physically abused: Not on file    Forced sexual activity: Not on file  Other Topics Concern  . Not on file  Social History Narrative  . Not on file    Review of systems: Review of Systems  Constitutional: Negative for fever and chills.  HENT: Negative.   Eyes: Negative for blurred vision.  Respiratory: as per HPI  Cardiovascular: Negative for chest pain and palpitations.  Gastrointestinal: Negative for vomiting, diarrhea, blood per rectum. Genitourinary: Negative for dysuria, urgency, frequency and hematuria.  Musculoskeletal: Negative for myalgias, back pain and joint pain.  Skin: Negative for itching and rash.  Neurological: Negative for dizziness, tremors, focal weakness, seizures and loss of consciousness.  Endo/Heme/Allergies: Negative for environmental allergies.  Psychiatric/Behavioral: Negative for depression, suicidal ideas and hallucinations.  All other systems reviewed and are negative.  Physical Exam: Blood pressure 132/72, pulse 88, height 5' (1.524 m), weight 150 lb (68 kg), SpO2 93 %. Gen:      No acute distress HEENT:  EOMI, sclera  anicteric Neck:     No masses; no thyromegaly Lungs:    Clear to auscultation bilaterally; normal respiratory effort CV:         Regular rate and rhythm; no murmurs Abd:      + bowel sounds; soft, non-tender; no palpable masses, no distension Ext:    No edema; adequate peripheral perfusion Skin:      Warm and dry; no rash Neuro: alert and oriented x 3 Psych: normal mood and affect  Data Reviewed: CTA chest 09/03/16- Negative for pulmonary embolus. Extensive bilateral ground-glass opacities, bilateral hilar and mediastinal lymph nodes. Calcific aortic and coronary atherosclerosis. Hiatal hernia.  Chest x-ray 09/07/16-improvement in interstitial opacities. Chest x-ray 10/12/16-stable interstitial opacities I reviewed her images personally  FENO 05/12/17-47  PFTs-unable to  obtain.  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  Continues on Plaquenil and Arava per her rheumatologist Chest x-ray today.  Dyspnea, wheezing As the prednisone has been tapered off she has developed some wheezing.  Unable to obtain PFTs due to inability to follow instructions She is not able to use inhalers effectively as she cannot generate inspiratory flow We will switch her to Brovana, Pulmicort nebulizers. Check CBC differential, blood allergy profile, alpha-1 antitrypsin levels and phenotype.  Health maintenance 12/07/2016-influenza 09/04/2015- Pneumovax.  Plan/Recommendations: - Stop Breo, start Brovana, Pulmicort nebulizer - Chest x-ray, CBC differential, blood allergy profile, alpha-1 antitrypsin levels and phenotype.  Follow up in 3 months  Chilton Greathouse MD [facility] 08/17/2017, 11:24 AM  CC: Dema Severin, NP  Pollyann Savoy MD

## 2017-08-18 ENCOUNTER — Telehealth: Payer: Self-pay | Admitting: Pulmonary Disease

## 2017-08-18 NOTE — Telephone Encounter (Signed)
Called [facility] and spoke with Amy stating to her scripts needed to be clarified.  Per Amy, in order for pt to be able to receive neb solutions through [facility] and for pt's insurance to cover it, the diangoses code would have to be a code between J41.0 and J70.9.  With pt's current diagnosis code, pt would have to pay for nebs out of pocket or meds will have to be through her local pharmacy.  Dr. Isaiah SergeMannam, please advise if there is a different diagnosis code we can use for pt.  Thanks!

## 2017-08-19 MED ORDER — BUDESONIDE 0.5 MG/2ML IN SUSP: 0.5000 mg | Freq: Two times a day (BID) | RESPIRATORY_TRACT | 12 refills | Status: DC

## 2017-08-19 MED ORDER — ARFORMOTEROL TARTRATE 15 MCG/2ML IN NEBU: 15.0000 ug | INHALATION_SOLUTION | Freq: Two times a day (BID) | RESPIRATORY_TRACT | 12 refills | Status: DC

## 2017-08-19 NOTE — Telephone Encounter (Signed)
The scripts were originally sent to [facility] but due to pt's diagnosis not being covered at [facility], pt called her pharmacy to have the scripts be filled there.  Called [facility] and spoke with Marcelino DusterMichelle to clarify this and per Marcelino DusterMichelle, pt was needing the neb solutions.  Scripts have been sent to [facility] for [facility] and Marcelino DusterMichelle was made aware this was done.  Nothing further needed at this time.

## 2017-08-19 NOTE — Telephone Encounter (Signed)
Marcelino DusterMichelle from [facility] in [place] is calling to check on RX for nebulizer solution.  She states they have not recd RX for this but patient is calling and asking about this.  Marcelino DusterMichelle, [place] is [phone].

## 2017-08-19 NOTE — Addendum Note (Signed)
Addended by: Wyvonne LenzPINION, Janus Vlcek P on: 08/19/2017 09:49 AM   Modules accepted: Orders

## 2017-08-20 ENCOUNTER — Telehealth: Payer: Self-pay | Admitting: Pulmonary Disease

## 2017-08-20 MED ORDER — BUDESONIDE 0.5 MG/2ML IN SUSP: 0.5000 mg | Freq: Two times a day (BID) | RESPIRATORY_TRACT | 12 refills | Status: DC

## 2017-08-20 MED ORDER — ARFORMOTEROL TARTRATE 15 MCG/2ML IN NEBU: 15.0000 ug | INHALATION_SOLUTION | Freq: Two times a day (BID) | RESPIRATORY_TRACT | 12 refills | Status: DC

## 2017-08-20 NOTE — Telephone Encounter (Signed)
Patient returning Mary [name] call, CB is [phone]

## 2017-08-20 NOTE — Telephone Encounter (Signed)
Called and spoke with pt letting her know that the Rosalyn GessBrovana will be sent to [facility].  Also stated to her we had to give a different dx to get her neb solutions covered.  Pt expressed understanding.  brovana has been sent to [facility] pharmacy and dx code has been placed for the asthma dx on the Rx

## 2017-08-20 NOTE — Telephone Encounter (Signed)
Use Dx of asthma 

## 2017-08-20 NOTE — Telephone Encounter (Signed)
Called and spoke to Wat[place]Michelle with [facility] and provided asthma ICD code [place]. Marcelino DusterMichelle stated that [facility] Pulmicort, however Rosalyn GessBrovana has to go through a pharmacy that covers medicare. I have spoken to pt and made her aware of this matter. Pt agreed to proceed with [facility] for Brovana. Rosalyn GessBrovana will need to be ran with asthma dx.  lmtcb x1 for [facility].

## 2017-08-20 NOTE — Telephone Encounter (Addendum)
Called and spoke to CourtlandMichelle with [facility].  Marcelino DusterMichelle stated that ILD is not a covered dx for Phelps DodgeBrovana.  Rheumatoid lung was not approved either.  Dr. Isaiah SergeMannam please advise on dx code. Thanks

## 2017-08-21 LAB — RESPIRATORY ALLERGY PROFILE REGION II ~~LOC~~
Allergen, Cedar tree, t12: <0.1 kU/L
Allergen, Comm Silver Birch, t9: <0.1 kU/L
Allergen, Cottonwood, t14: <0.1 kU/L
Allergen, D pternoyssinus,d7: <0.1 kU/L
Allergen, Mulberry, t76: <0.1 kU/L
Allergen, Oak,t7: <0.1 kU/L
Aspergillus fumigatus, m3: <0.1 kU/L
Bermuda Grass: <0.1 kU/L
CLADOSPORIUM HERBARUM (M2) IGE: <0.1 kU/L
CLASS: 0
CLASS: 0
CLASS: 0
CLASS: 0
CLASS: 0
CLASS: 0
CLASS: 0
CLASS: 0
CLASS: 0
CLASS: 0
CLASS: 0
COMMON RAGWEED (SHORT) (W1) IGE: <0.1 kU/L
Cat Dander: <0.1 kU/L
Class: 0
Class: 0
Class: 0
Class: 0
Class: 0
Class: 0
Class: 0
Class: 0
Class: 0
Class: 0
Class: 0
Class: 0
Class: 0
Cockroach: <0.1 kU/L
D. farinae: <0.1 kU/L
Dog Dander: <0.1 kU/L
Elm IgE: <0.1 kU/L
IGE (IMMUNOGLOBULIN E), SERUM: 10 kU/L (ref <=114)
Pecan/Hickory Tree IgE: <0.1 kU/L
Rough Pigweed  IgE: <0.1 kU/L
Sheep Sorrel IgE: <0.1 kU/L

## 2017-08-21 LAB — INTERPRETATION:

## 2017-08-21 LAB — ALPHA-1 ANTITRYPSIN PHENOTYPE: A-1 Antitrypsin, Ser: 159 mg/dL (ref 83–199)

## 2017-08-30 ENCOUNTER — Other Ambulatory Visit (INDEPENDENT_AMBULATORY_CARE_PROVIDER_SITE_OTHER): Payer: Self-pay | Admitting: Rheumatology

## 2017-08-30 NOTE — Telephone Encounter (Signed)
Last visit: 07/14/17 Next visit: 12/14/17 Labs: 07/21/17 stable  Okay to refill per Dr. Deveshwar 

## 2017-08-31 ENCOUNTER — Ambulatory Visit: Payer: Medicare HMO | Admitting: Pulmonary Disease

## 2017-09-03 ENCOUNTER — Telehealth: Payer: Self-pay | Admitting: Pulmonary Disease

## 2017-09-03 MED ORDER — BUDESONIDE 0.5 MG/2ML IN SUSP: 0.5000 mg | Freq: Two times a day (BID) | RESPIRATORY_TRACT | 12 refills | Status: DC

## 2017-09-03 NOTE — Telephone Encounter (Signed)
Pt's budesonide script was sent to pt's pharmacy on 08/20/17.  Called [facility] Angelica ChessmanMandy to clarify for pt if the med had been picked up and [facility], script was picked up on 6/22.  Called pt to clarify if it was the budesonide script she was talking about and pt stated whichever neb sol could not be covered [facility] that was supposed to be sent elsewhere was the script she had not received.  Looked through pt's recent meds and the brovana was sent to [facility] but pt stated she has not received it yet.  Dr. Sherene SiresWert, please advise if it is okay to print pt's brovana neb sol script so we can send it to [facility] for pt due to Dr. Isaiah SergeMannam being out of the office.

## 2017-09-03 NOTE — Telephone Encounter (Signed)
RX printed and stamped with PM's signature. Will fax over to [facility] for patient. Patient is aware. Nothing else needed at time of call.

## 2017-09-03 NOTE — Telephone Encounter (Signed)
Fine with me

## 2017-09-09 ENCOUNTER — Telehealth: Payer: Self-pay | Admitting: Pulmonary Disease

## 2017-09-09 NOTE — Telephone Encounter (Signed)
Spoke with patient. She stated that she still has not received her Brovana medication. She has received her nebulizer machine. Medication was to be sent from [facility]. Advised patient that I would check on the status of this medication.   ATC [facility] but they are closed for lunch until 1pm.   [facility] but was placed on a long hold. Will attempt to call back later.

## 2017-09-13 ENCOUNTER — Telehealth: Payer: Self-pay | Admitting: Pulmonary Disease

## 2017-09-13 MED ORDER — ARFORMOTEROL TARTRATE 15 MCG/2ML IN NEBU: 15.0000 ug | INHALATION_SOLUTION | Freq: Two times a day (BID) | RESPIRATORY_TRACT | 12 refills | Status: DC

## 2017-09-13 NOTE — Telephone Encounter (Signed)
Attempted to contact pt. Received a busy signal x2. Will try back. 

## 2017-09-13 NOTE — Telephone Encounter (Signed)
Called [facility] and spoke with Adelina MingsKelsey who stated pt was spoken to on June 25 and was [facility].  Will send script to pt's preferred pharmacy for her to have it filled.  Called and spoke with pt letting her know this was being done. Pt expressed understanding. Nothing further needed.

## 2017-09-13 NOTE — Telephone Encounter (Signed)
[facility]ed [facility] but was placed on hold for awhile. Left a message for [facility] to return our call so we can see if we can get things taken care of regarding pt's Brovana neb sol.

## 2017-09-13 NOTE — Telephone Encounter (Signed)
[place] [facility] back [phone]

## 2017-09-14 ENCOUNTER — Ambulatory Visit: Payer: Medicare HMO | Admitting: Physician Assistant

## 2017-09-14 NOTE — Telephone Encounter (Signed)
Spoke with pt. States that Rosalyn GessBrovana is going to expensive for her. Advised her to contact her insurance company and get a copy of her drug formulary. She verbalized understanding.

## 2017-09-17 ENCOUNTER — Telehealth: Payer: Self-pay | Admitting: Pulmonary Disease

## 2017-09-17 NOTE — Telephone Encounter (Signed)
Called and spoke with pt regarding insurance formulary to our office. She advised that she spoke with insurance company and it would take 2 weeks to get to pt Once pt receives the formulary she will send it to our office.  [facility] needing additional info on PA Filled out the form, placed form in Dr. Nanine MeansMannam's cubby  Routing message to Delray AltMargie to f/u with pt in two weeks regarding this PA

## 2017-09-17 NOTE — Telephone Encounter (Signed)
Called [employer] and was placed on hold for over 10 minutes. Left message for someone to give us a call back.

## 2017-09-21 NOTE — Telephone Encounter (Signed)
I was able to speak with rep at [facility]-request for Brovana case was closed due to lack of information. Pt's insurance will not cover Brovana for her. Mannam, please advise. Thanks.

## 2017-09-23 NOTE — Telephone Encounter (Signed)
Dr. Isaiah SergeMannam please advise what pt can use instead of Brovana. Pt's insurance will not cover Brovana at this time.

## 2017-09-24 MED ORDER — FORMOTEROL FUMARATE 20 MCG/2ML IN NEBU: 20.0000 ug | INHALATION_SOLUTION | Freq: Two times a day (BID) | RESPIRATORY_TRACT | 11 refills | Status: DC

## 2017-09-24 NOTE — Telephone Encounter (Signed)
perforomist has been sent to the pharmacy per PM request to change meds.

## 2017-09-24 NOTE — Telephone Encounter (Signed)
Try performist nebs. If it is not covered as well then duo nebs q4 hrs as needed

## 2017-09-25 ENCOUNTER — Other Ambulatory Visit: Payer: Self-pay | Admitting: Rheumatology

## 2017-09-27 ENCOUNTER — Other Ambulatory Visit: Payer: Self-pay | Admitting: Rheumatology

## 2017-09-27 NOTE — Telephone Encounter (Signed)
Last visit: 07/14/17 Next visit: 12/14/17 Labs: 07/21/17 stable PLQ eye exam: 12/23/2016 Normal.  Okay to refill per Dr. Corliss Skainseveshwar

## 2017-09-27 NOTE — Telephone Encounter (Signed)
Last visit: 07/14/17 Next visit: 12/14/17 Labs: 07/21/17 stable  Okay to refill per Dr. Deveshwar 

## 2017-09-29 NOTE — Telephone Encounter (Signed)
Please see 09/17/17 phone note.  Nothing further is needed.

## 2017-09-30 NOTE — Telephone Encounter (Signed)
We received a rejection for the preformist it is not covered. Dr. Isaiah SergeMannam would you like us to do the PA or change this medication please advise Thank you.

## 2017-10-07 ENCOUNTER — Telehealth: Payer: Self-pay | Admitting: Pulmonary Disease

## 2017-10-07 NOTE — Telephone Encounter (Signed)
Perforomist neb solution has been denied by insurance. Per last telephone encounter, Dr Isaiah SergeMannam also mentioned trying duoneb qid if insurance did not cover Perforomist.   Please advise if this is how you'd like to proceed.  Thanks!

## 2017-10-08 ENCOUNTER — Telehealth: Payer: Self-pay | Admitting: Rheumatology

## 2017-10-08 DIAGNOSIS — Z79899 Other long term (current) drug therapy: Secondary | ICD-10-CM

## 2017-10-08 MED ORDER — IPRATROPIUM-ALBUTEROL 0.5-2.5 (3) MG/3ML IN SOLN: 3.0000 mL | Freq: Four times a day (QID) | RESPIRATORY_TRACT | 3 refills | Status: DC

## 2017-10-08 NOTE — Telephone Encounter (Signed)
Patient called requesting her labwork orders be sent to [facility] in [place].  Patient states she is planning to go on Monday 10/11/17.

## 2017-10-08 NOTE — Telephone Encounter (Signed)
Yes. Start duonebs qid as needed

## 2017-10-08 NOTE — Telephone Encounter (Signed)
Lab orders have been placed and released for [facility]. Patient has been notified.

## 2017-10-08 NOTE — Telephone Encounter (Signed)
Spoke with pt, advised her of medication change and that RX was sent to the pharmacy. Pt understood and nothing further is needed.

## 2017-10-13 LAB — CBC WITH DIFFERENTIAL/PLATELET
BASOS: 1 %
Basophils Absolute: 0 10*3/uL (ref 0.0–0.2)
EOS (ABSOLUTE): 0.6 10*3/uL — ABNORMAL HIGH (ref 0.0–0.4)
Eos: 8 %
Hematocrit: 29.6 % — ABNORMAL LOW (ref 34.0–46.6)
Hemoglobin: 9.9 g/dL — ABNORMAL LOW (ref 11.1–15.9)
LYMPHS: 22 %
Lymphocytes Absolute: 1.5 10*3/uL (ref 0.7–3.1)
MCH: 29.8 pg (ref 26.6–33.0)
MCHC: 33.4 g/dL (ref 31.5–35.7)
MCV: 89 fL (ref 79–97)
MONOCYTES: 9 %
Monocytes Absolute: 0.7 10*3/uL (ref 0.1–0.9)
NEUTROS ABS: 4.2 10*3/uL (ref 1.4–7.0)
Neutrophils: 60 %
Platelets: 237 10*3/uL (ref 150–450)
RBC: 3.32 x10E6/uL — ABNORMAL LOW (ref 3.77–5.28)
RDW: 14.2 % (ref 12.3–15.4)
WBC: 7 10*3/uL (ref 3.4–10.8)

## 2017-10-13 LAB — CMP14+EGFR
A/G RATIO: 2 (ref 1.2–2.2)
ALBUMIN: 4.3 g/dL (ref 3.5–4.8)
ALT: 7 IU/L (ref 0–32)
AST: 14 IU/L (ref 0–40)
Alkaline Phosphatase: 73 IU/L (ref 39–117)
BUN/Creatinine Ratio: 30 — ABNORMAL HIGH (ref 12–28)
BUN: 16 mg/dL (ref 8–27)
Bilirubin Total: 0.3 mg/dL (ref 0.0–1.2)
CALCIUM: 9.5 mg/dL (ref 8.7–10.3)
CO2: 17 mmol/L — ABNORMAL LOW (ref 20–29)
Chloride: 108 mmol/L — ABNORMAL HIGH (ref 96–106)
Creatinine, Ser: 0.54 mg/dL — ABNORMAL LOW (ref 0.57–1.00)
GFR, EST AFRICAN AMERICAN: 107 mL/min/{1.73_m2} (ref >59)
GFR, EST NON AFRICAN AMERICAN: 93 mL/min/{1.73_m2} (ref >59)
Globulin, Total: 2.2 g/dL (ref 1.5–4.5)
Glucose: 76 mg/dL (ref 65–99)
POTASSIUM: 4.3 mmol/L (ref 3.5–5.2)
Sodium: 141 mmol/L (ref 134–144)
TOTAL PROTEIN: 6.5 g/dL (ref 6.0–8.5)

## 2017-10-13 NOTE — Telephone Encounter (Signed)
Drop in HB . Please notify Pt and fax results to her PCP.

## 2017-11-15 ENCOUNTER — Other Ambulatory Visit (INDEPENDENT_AMBULATORY_CARE_PROVIDER_SITE_OTHER): Payer: Self-pay | Admitting: Rheumatology

## 2017-11-15 NOTE — Telephone Encounter (Signed)
Last visit: 07/14/17 Next visit: 12/14/17 Labs: 07/21/17 stable  Okay to refill per Dr. Corliss Skainseveshwar

## 2017-11-18 ENCOUNTER — Telehealth: Payer: Self-pay | Admitting: Pulmonary Disease

## 2017-11-18 ENCOUNTER — Ambulatory Visit: Payer: Medicare HMO | Admitting: Pulmonary Disease

## 2017-11-18 ENCOUNTER — Encounter: Payer: Self-pay | Admitting: Pulmonary Disease

## 2017-11-18 VITALS — BP 144/80 | HR 75 | Ht 60.0 in | Wt 152.0 lb

## 2017-11-18 DIAGNOSIS — J849 Interstitial pulmonary disease, unspecified: Secondary | ICD-10-CM | POA: Diagnosis not present

## 2017-11-18 DIAGNOSIS — J4530 Mild persistent asthma, uncomplicated: Secondary | ICD-10-CM | POA: Insufficient documentation

## 2017-11-18 MED ORDER — ALBUTEROL SULFATE (2.5 MG/3ML) 0.083% IN NEBU: 2.5000 mg | INHALATION_SOLUTION | Freq: Four times a day (QID) | RESPIRATORY_TRACT | 5 refills | Status: AC | PRN

## 2017-11-18 NOTE — Patient Instructions (Signed)
I am glad your breathing is doing better Continue your nebulizers as prescribed Follow back in 6 months Please give us a call sooner if there is any change in his symptoms.

## 2017-11-18 NOTE — Telephone Encounter (Signed)
Yes.It would be fine to use albuterol instead of duonebs

## 2017-11-18 NOTE — Telephone Encounter (Signed)
Spoke with patient, patient states the Duo-neb is too expensive and would like to see if she can albuterol instead as pharmacist told her it would be cheaper.   PM please advise if okay to switch patient from Duo-neb to albuteral due to cost for patient.

## 2017-11-18 NOTE — Progress Notes (Signed)
Mary MeierGaynelle [name]    [id number]    1940/09/07  Primary Care Physician:York, Orie Routegina F, NP  Referring Physician: Dema SeverinYork, Regina F, NP [address] [place], [place] [id number]  Chief complaint:   Follow up for RA-ILD  HPI: 77 year old with history of rheumatoid arthritis, diabetes, gout, hypertension, hyperlipidemia and hypothyroidism. She is being followed by Dr. Corliss Skainseveshwar for management of rheumatoid arthritis. She had been previously maintained on methotrexate. She was taken off methotrexate with plans to transition to leflunomide. Before initiation of leflunomide she was hospitalized July 2018 with respiratory failure, bilateral lung infiltrates. CT at that time did not show any pulmonary embolus but there are diffuse ground glass opacities concerning for interstitial lung disease or edema.   She was started on prednisone and diuresed with improvement in symptoms. Bronchoscopy was considered but deferred due to improvement in respiratory status. She has been discharged on a prednisone taper and has followed up with Dr. Corliss Skainseveshwar and is currently on [place]rava.  Interim History:. States that breathing is at baseline.  Continues on Arava and Plaquenil per rheumatology.  Outpatient Encounter Medications as of 11/18/2017  Medication Sig  . acetaminophen (TYLENOL) 500 MG tablet as needed.   Marland Kitchen. alendronate (FOSAMAX) 70 MG tablet TAKE 1 TABLET WEEKLY, SAME TIME EACH WEEK. TAKE ON EMPTY STOMACH WITHFULL GLASS OF WATER. DON'T LIE DOWN 30 MIN.  Marland Kitchen. allopurinol (ZYLOPRIM) 100 MG tablet Take 100 mg by mouth 2 (two) times daily.   Marland Kitchen. arformoterol (BROVANA) 15 MCG/2ML NEBU Take 2 mLs (15 mcg total) by nebulization 2 (two) times daily. J45.909  . atorvastatin (LIPITOR) 10 MG tablet Take 10 mg by mouth daily.  . benazepril (LOTENSIN) 20 MG tablet Take 1 tablet (20 mg total) by mouth daily.  . budesonide (PULMICORT) 0.5 MG/2ML nebulizer solution Take 2 mLs (0.5 mg total) by nebulization 2 (two) times daily.  J45.909  . carvedilol (COREG) 6.25 MG tablet Take 6.25 mg by mouth 2 (two) times daily with a meal.   . Cholecalciferol (VITAMIN D3) 5000 units TABS Take 1 tablet by mouth. Takes on Monday, Wednesday, and Friday.  . clorazepate (TRANXENE) 7.5 MG tablet Take 7.5 mg by mouth 2 (two) times daily.   . formoterol (PERFOROMIST) 20 MCG/2ML nebulizer solution Take 2 mLs (20 mcg total) by nebulization 2 (two) times daily.  Marland Kitchen. glipiZIDE (GLUCOTROL) 5 MG tablet   . hydroxychloroquine (PLAQUENIL) 200 MG tablet TAKE 1 TABLET BY MOUTH TWICE DAILY. MONDAY THROUGH FRIDAY ONLY. **DONT TAKE ON SATURDAY AND SUNDAY**  . ipratropium-albuterol (DUONEB) 0.5-2.5 (3) MG/3ML SOLN Take 3 mLs by nebulization 4 (four) times daily.  Marland Kitchen. leflunomide (ARAVA) 20 MG tablet TAKE 1 TABLET BY MOUTH DAILY  . levothyroxine (SYNTHROID, LEVOTHROID) 75 MCG tablet Take 75 mcg by mouth daily.   . metFORMIN (GLUCOPHAGE) 500 MG tablet Take 500 mg by mouth 2 (two) times daily with a meal.    No facility-administered encounter medications on file as of 11/18/2017.    Physical Exam: Blood pressure 132/72, pulse 88, height 5' (1.524 m), weight 150 lb (68 kg), SpO2 93 %. Gen:      No acute distress HEENT:  EOMI, sclera anicteric Neck:     No masses; no thyromegaly Lungs:    Clear to auscultation bilaterally; normal respiratory effort CV:         Regular rate and rhythm; no murmurs Abd:      + bowel sounds; soft, non-tender; no palpable masses, no distension Ext:  No edema; adequate peripheral perfusion Skin:      Warm and dry; no rash Neuro: alert and oriented x 3 Psych: normal mood and affect  Data Reviewed: Imaging CTA chest 09/03/16- Negative for pulmonary embolus. Extensive bilateral ground-glass opacities, bilateral hilar and mediastinal lymph nodes. Calcific aortic and coronary atherosclerosis. Hiatal hernia.  Chest x-ray 09/07/16-improvement in interstitial opacities. Chest x-ray 10/12/16-stable interstitial opacities Chest x-ray  08/17/17- mild stable cardiomegaly, no active lung disease. I reviewed the images personally.  PFTs PFTs-unable to obtain.  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 Continues on Plaquenil and Arava per rheumatology. Continue monitoring  Mild persistent asthma As the prednisone was tapered off she has developed some wheezing.  Unable to obtain PFTs due to inability to follow instructions.  Has mild elevation in FENO and peripheral eosinophilia.  She is not able to use inhalers effectively as she cannot generate inspiratory flow Doing well now on Brovana and Pulmicort nebulizers.  Health maintenance 12/07/2016-influenza 09/04/2015- Pneumovax.  Plan/Recommendations: - Continue Brovana, Pulmicort nebulizer  Follow up in 6 months  Chilton Greathouse MD [facility] 11/18/2017, 11:33 AM  CC: Dema Severin, NP  Pollyann Savoy MD

## 2017-11-18 NOTE — Telephone Encounter (Signed)
Spoke with patient, made albuterol has been sent to [facility]. Nothing further needed at this time.

## 2017-11-30 NOTE — Progress Notes (Signed)
Office Visit Note  Patient: Mary [name]             Date of Birth: November 22, 1940           MRN: [id number]             PCP: Dema Severin, NP Referring: Dema Severin, NP Visit Date: 12/14/2017 Occupation: @GUAROCC @  Subjective:  Right knee pain   History of Present Illness: Mary [name] is a 77 y.o. female with history of seronegative rheumatoid arthritis, gout, osteoarthritis, and osteoporosis.  She is on Arava 20 mg by mouth daily and Plaquenil 200 mg by mouth twice daily Monday through Friday.  She has not had any recent rheumatoid arthritis flares.  She reports that after her last visit where she had a right knee cortisone injection she developed shingles.  She reports that she was started on Valtrex but continues to have neuralgia.  She has been using a pain patch but is unsure what the name of it is.  She reports that the right knee cortisone injection provided only temporary relief.  She reports she continues to have pain and swelling in her right knee joint.  She states that she has difficulty bearing weight and fears that her right knee is going to give out on her.  She states that at home she is either in a wheelchair or uses a walker for very short distances.  She takes Tylenol for pain relief.  She denies any recent gout flares.  She continues take allopurinol 200 mg by mouth daily.  She takes Fosamax 70 mg by mouth once weekly for management of osteoporosis.  We will schedule her next bone density.   Activities of Daily Living:  Patient reports morning stiffness for 0  minutes.   Patient Denies nocturnal pain.  Difficulty dressing/grooming: Denies Difficulty climbing stairs: Reports Difficulty getting out of chair: Reports Difficulty using hands for taps, buttons, cutlery, and/or writing: Reports  Review of Systems  Constitutional: Negative for fatigue.  HENT: Negative for mouth sores, mouth dryness and nose dryness.   Eyes: Negative for pain, visual disturbance and  dryness.  Respiratory: Negative for cough, hemoptysis, shortness of breath and difficulty breathing.   Cardiovascular: Negative for chest pain, palpitations, hypertension and swelling in legs/feet.  Gastrointestinal: Negative for blood in stool, constipation and diarrhea.  Endocrine: Negative for increased urination.  Genitourinary: Negative for painful urination.  Musculoskeletal: Positive for arthralgias, joint pain and joint swelling. Negative for myalgias, muscle weakness, morning stiffness, muscle tenderness and myalgias.  Skin: Negative for color change, pallor, rash, hair loss, nodules/bumps, skin tightness, ulcers and sensitivity to sunlight.  Allergic/Immunologic: Negative for susceptible to infections.  Neurological: Negative for dizziness, numbness, headaches and weakness.  Hematological: Negative for swollen glands.  Psychiatric/Behavioral: Negative for depressed mood and sleep disturbance. The patient is not nervous/anxious.     PMFS History:  Patient Active Problem List   Diagnosis Date Noted  . Mild persistent asthma without complication 11/18/2017  . Age-related osteoporosis without current pathological fracture 11/19/2016  . Acute respiratory failure (HCC)   . Rheumatoid lung (HCC)   . Hypoxia 09/03/2016  . Essential hypertension 09/03/2016  . Urinary tract infection 09/03/2016  . 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 High risk medication use 02/28/2016  . Idiopathic chronic gout of multiple sites without tophus 02/28/2016  . Primary osteoarthritis of both knees 02/28/2016  . Flexion contractures 02/28/2016  . History of hypothyroidism 02/28/2016  .  History of diabetes mellitus 02/28/2016  . History of shingles 02/28/2016  . History of hyperlipidemia 02/28/2016    Past Medical History:  Diagnosis Date  . Complication of anesthesia   . Diabetes mellitus without complication (HCC)   . Dyspnea   . Gout   . High  cholesterol   . History of kidney stones   . Hypertension   . Hypoxia 08/2016  . Osteoarthritis   . PONV (postoperative nausea and vomiting)   . Rheumatoid arthritis (HCC)     Family History  Problem Relation Age of Onset  . Arthritis Brother   . Arthritis Sister   . Arthritis Sister   . Arthritis Brother   . Arthritis Brother   . Arthritis Brother    Past Surgical History:  Procedure Laterality Date  . ABDOMINAL HYSTERECTOMY     Social History   Social History Narrative  . Not on file    Objective: Vital Signs: BP 123/78 (BP Location: Left Arm, Patient Position: Sitting, Cuff Size: Normal)   Pulse 77   Resp 15   Ht 5' (1.524 m) Comment: per patient  Wt 150 lb (68 kg) Comment: per patient  BMI 29.29 kg/m    Physical Exam  Constitutional: She is oriented to person, place, and time. She appears well-developed and well-nourished.  HENT:  Head: Normocephalic and atraumatic.  Eyes: Conjunctivae and EOM are normal.  Neck: Normal range of motion.  Cardiovascular: Normal rate, regular rhythm, normal heart sounds and intact distal pulses.  Pulmonary/Chest: Effort normal and breath sounds normal.  Abdominal: Soft. Bowel sounds are normal.  Lymphadenopathy:    She has no cervical adenopathy.  Neurological: She is alert and oriented to person, place, and time.  Skin: Skin is warm and dry. Capillary refill takes less than 2 seconds.  Psychiatric: She has a normal mood and affect. Her behavior is normal.  Nursing note and vitals reviewed.    Musculoskeletal Exam: C-spine limited range of motion.  Unable to fully assess thoracic and lumbar spine right range of motion due to patient being wheelchair.  She has no midline spinal tenderness or SI joint tenderness on exam.  She has limited shoulder abduction to 90 degrees bilaterally.  Elbow joint contractures but no synovitis or tenderness.  No tenderness or synovitis of MCP joints.  She has subluxation of MCPs 2.  She has  incomplete fist formation bilaterally. Limited ROM of both wrist joints but no tenderness or synovitis noted. Right knee warmth.  Limited extension and valgus deformity of right knee joint.  Left knee mild warmth.  Bilateral knee crepitus.  Diabetic shoes bilaterally.   CDAI Exam: CDAI Score: 0.6  Patient Global Assessment: 3 (mm); Provider Global Assessment: 3 (mm) Swollen: 0 ; Tender: 0  Joint Exam   Not documented   There is currently no information documented on the homunculus. Go to the Rheumatology activity and complete the homunculus joint exam.  Investigation: No additional findings.  Imaging: No results found.  Recent Labs: Lab Results  Component Value Date   WBC 7.0 10/12/2017   HGB 9.9 (L) 10/12/2017   PLT 237 10/12/2017   NA 141 10/12/2017   K 4.3 10/12/2017   CL 108 (H) 10/12/2017   CO2 17 (L) 10/12/2017   GLUCOSE 76 10/12/2017   BUN 16 10/12/2017   CREATININE 0.54 (L) 10/12/2017   BILITOT 0.3 10/12/2017   ALKPHOS 73 10/12/2017   AST 14 10/12/2017   ALT 7 10/12/2017   PROT 6.5 10/12/2017  ALBUMIN 4.3 10/12/2017   CALCIUM 9.5 10/12/2017   GFRAA 107 10/12/2017    Speciality Comments: PLQ eye exam: 12/23/2016 Normal. [facility]. Follow up in 6 months.  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  She has not had any recent rheumatoid arthritis flares.  She has chronic flexion contractures of several joints and subluxation of MCP joints.  She is in complete fist formation bilaterally.  She has no tenderness or joint swelling at this time.  She is clinically doing well on Arava 20 mg by mouth daily and 500 mg by mouth twice daily Monday through Friday.  She has not missed any doses of her medications recently.  She does not need any refills at this time.  She will follow-up in the  office in 5 months.  She is advised to notify us if she develops increased joint pain or joint swelling.  High risk medication use - Arava 20 mg, PLQ 200 mg BID M-F. eye exam: 12/23/2016.  CBC and CMP return on 10/19/2017.  She will return for lab work in November and every 3 months to monitor for drug toxicity.  Flexion contractures  Idiopathic chronic gout of multiple sites without tophus -She has not had any recent gout flares.  She is on allopurinol 200 mg by mouth daily.  A future order for uric acid was placed today.  She is to go to [facility] for lab work.  She was advised to notify us if she develops a gout flare.  Plan: Uric acid  Age-related osteoporosis without current pathological fracture - She takes Fosamax 70 mg once a week. Lumbar spine shows a T score of -3.6 done October 2017.  Repeat bone density scan was ordered today.   - Plan: DG BONE DENSITY (DXA)  Chronic pain of right knee: Right knee warmth.  Limited extension and valgus deformity.  She had a cortisone injection performed on 07/09/2017 which provided temporary relief.  She has been taking Tylenol for pain relief.  She spends majority of her time in a wheelchair but occasionally will walk very short distances at home using a walker.  She feels significant instability in her right knee joint she would like to try wearing a brace.  In the office she tried on a right knee spider brace but did not feel any additional support.  She was given a prescription for a right knee osteoarthritis custom brace which she will take to [facility].  Primary osteoarthritis of both knees: Chronic pain.  She has bilateral knee crepitus.  Right knee warmth on exam with limited extension and valgus deformity.  She is given a prescription for a custom right knee brace which she will take to [facility].  Other medical conditions are listed as follows:  Essential hypertension  Anemia, unspecified type  History of hyperlipidemia  History of  hypothyroidism  History of shingles  History of kidney stones  History of diabetes mellitus   Orders: Orders Placed This Encounter  Procedures  . DG BONE DENSITY (DXA)  . Uric acid   No orders of the defined types were placed in this encounter.   Face-to-face time spent with patient was 30 minutes. Greater than 50% of time was spent in counseling and coordination of care.  Follow-Up Instructions: Return in about 5 months (around 05/15/2018) for Rheumatoid arthritis, Gout,  Osteoporosis, Osteoarthritis.   Gearldine Bienenstock, PA-C  Note - This record has been created using Dragon software.  Chart creation errors have been sought, but may not always  have been located. Such creation errors do not reflect on  the standard of medical care.

## 2017-12-14 ENCOUNTER — Ambulatory Visit: Payer: Medicare HMO | Admitting: Physician Assistant

## 2017-12-14 ENCOUNTER — Encounter: Payer: Self-pay | Admitting: Physician Assistant

## 2017-12-14 VITALS — BP 123/78 | HR 77 | Resp 15 | Ht 60.0 in | Wt 150.0 lb

## 2017-12-14 DIAGNOSIS — M810 Age-related osteoporosis without current pathological fracture: Secondary | ICD-10-CM

## 2017-12-14 DIAGNOSIS — M0609 Rheumatoid arthritis without rheumatoid factor, multiple sites: Secondary | ICD-10-CM

## 2017-12-14 DIAGNOSIS — I10 Essential (primary) hypertension: Secondary | ICD-10-CM

## 2017-12-14 DIAGNOSIS — M1A09X0 Idiopathic chronic gout, multiple sites, without tophus (tophi): Secondary | ICD-10-CM | POA: Diagnosis not present

## 2017-12-14 DIAGNOSIS — Z79899 Other long term (current) drug therapy: Secondary | ICD-10-CM | POA: Diagnosis not present

## 2017-12-14 DIAGNOSIS — M25561 Pain in right knee: Secondary | ICD-10-CM

## 2017-12-14 DIAGNOSIS — M170 Bilateral primary osteoarthritis of knee: Secondary | ICD-10-CM

## 2017-12-14 DIAGNOSIS — Z8639 Personal history of other endocrine, nutritional and metabolic disease: Secondary | ICD-10-CM

## 2017-12-14 DIAGNOSIS — Z87442 Personal history of urinary calculi: Secondary | ICD-10-CM

## 2017-12-14 DIAGNOSIS — D649 Anemia, unspecified: Secondary | ICD-10-CM

## 2017-12-14 DIAGNOSIS — Z8619 Personal history of other infectious and parasitic diseases: Secondary | ICD-10-CM

## 2017-12-14 DIAGNOSIS — M2450 Contracture, unspecified joint: Secondary | ICD-10-CM | POA: Diagnosis not present

## 2017-12-14 DIAGNOSIS — G8929 Other chronic pain: Secondary | ICD-10-CM

## 2017-12-14 NOTE — Patient Instructions (Signed)
Standing Labs We placed an order today for your standing lab work.    Please come back and get your standing labs in November and every 3 months   Uric acid, CBC, and CMP   We have open lab Monday through Friday from 8:30-11:30 AM and 1:30-4:00 PM  at the office of Dr. Shaili Deveshwar.   You may experience shorter wait times on Monday and Friday afternoons. The office is located at [address], [address], [place], [place] [address] No appointment is necessary.   Labs are drawn by [facility].  You may receive a bill from [facility] for your lab work. If you have any questions regarding directions or hours of operation,  please call [phone].   Just as a reminder please drink plenty of water prior to coming for your lab work. Thanks!   

## 2018-01-10 ENCOUNTER — Other Ambulatory Visit: Payer: Self-pay | Admitting: Rheumatology

## 2018-01-10 NOTE — Telephone Encounter (Addendum)
Last visit: 12/14/17 Next visit: due March 2019. Message sent to the front to schedule patient. Labs:12/27/17 Hgb 10.9 Hct 33.7, elevated glucose. Will monitor.   Okay to refill per Dr. Deveshwar 

## 2018-01-10 NOTE — Telephone Encounter (Signed)
Please schedule patient for a follow up visit. Patient due March 2020 Thanks!  

## 2018-01-10 NOTE — Telephone Encounter (Signed)
Tried to call patient to schedule follow-up appointment.  Patient doesn't have answering machine / Unable to leave message

## 2018-01-14 NOTE — Telephone Encounter (Signed)
I tried contacting patient to schedule rov for March. No answer, and no way to leave a message.

## 2018-01-17 ENCOUNTER — Other Ambulatory Visit: Payer: Self-pay | Admitting: Rheumatology

## 2018-01-17 NOTE — Telephone Encounter (Signed)
Last visit: 12/14/17 Next visit: due March 2019. Message sent to the front to schedule patient. Labs: 12/27/17 Hgb 10.9 Hct 33.7, elevated glucose. Will monitor.  PLQ eye exam: 12/23/2016 Normal  Okay to refill per Dr.Deveshwar

## 2018-01-19 ENCOUNTER — Telehealth (INDEPENDENT_AMBULATORY_CARE_PROVIDER_SITE_OTHER): Payer: Self-pay | Admitting: Physician Assistant

## 2018-01-19 NOTE — Telephone Encounter (Signed)
12/14/17 ov note faxed to [facility] [phone]

## 2018-01-25 ENCOUNTER — Telehealth: Payer: Self-pay | Admitting: Rheumatology

## 2018-01-25 ENCOUNTER — Other Ambulatory Visit: Payer: Self-pay | Admitting: *Deleted

## 2018-01-25 DIAGNOSIS — M1A09X0 Idiopathic chronic gout, multiple sites, without tophus (tophi): Secondary | ICD-10-CM

## 2018-01-25 DIAGNOSIS — Z79899 Other long term (current) drug therapy: Secondary | ICD-10-CM

## 2018-01-25 NOTE — Telephone Encounter (Signed)
Patient called requesting her labwork orders be sent to [facility] in [place].  Patient is planning on going tomorrow or Friday.

## 2018-01-25 NOTE — Telephone Encounter (Signed)
Lab orders released.  

## 2018-01-30 LAB — CMP14+EGFR
ALBUMIN: 4 g/dL (ref 3.5–4.8)
ALT: 8 IU/L (ref 0–32)
AST: 11 IU/L (ref 0–40)
Albumin/Globulin Ratio: 1.6 (ref 1.2–2.2)
Alkaline Phosphatase: 81 IU/L (ref 39–117)
BILIRUBIN TOTAL: 0.5 mg/dL (ref 0.0–1.2)
BUN / CREAT RATIO: 29 — AB (ref 12–28)
BUN: 14 mg/dL (ref 8–27)
CHLORIDE: 108 mmol/L — AB (ref 96–106)
CO2: 18 mmol/L — ABNORMAL LOW (ref 20–29)
CREATININE: 0.48 mg/dL — AB (ref 0.57–1.00)
Calcium: 8.7 mg/dL (ref 8.7–10.3)
GFR calc Af Amer: 111 mL/min/{1.73_m2} (ref >59)
GFR calc non Af Amer: 96 mL/min/{1.73_m2} (ref >59)
GLUCOSE: 182 mg/dL — AB (ref 65–99)
Globulin, Total: 2.5 g/dL (ref 1.5–4.5)
Potassium: 4 mmol/L (ref 3.5–5.2)
Sodium: 137 mmol/L (ref 134–144)
TOTAL PROTEIN: 6.5 g/dL (ref 6.0–8.5)

## 2018-01-30 LAB — CBC WITH DIFFERENTIAL/PLATELET
Basophils Absolute: 0.1 10*3/uL (ref 0.0–0.2)
Basos: 1 %
EOS (ABSOLUTE): 0.5 10*3/uL — ABNORMAL HIGH (ref 0.0–0.4)
EOS: 6 %
HEMATOCRIT: 31.2 % — AB (ref 34.0–46.6)
HEMOGLOBIN: 10.4 g/dL — AB (ref 11.1–15.9)
LYMPHS: 15 %
Lymphocytes Absolute: 1.3 10*3/uL (ref 0.7–3.1)
MCH: 28.8 pg (ref 26.6–33.0)
MCHC: 33.3 g/dL (ref 31.5–35.7)
MCV: 86 fL (ref 79–97)
MONOCYTES: 8 %
Monocytes Absolute: 0.7 10*3/uL (ref 0.1–0.9)
NEUTROS ABS: 6 10*3/uL (ref 1.4–7.0)
NEUTROS PCT: 70 %
Platelets: 208 10*3/uL (ref 150–450)
RBC: 3.61 x10E6/uL — ABNORMAL LOW (ref 3.77–5.28)
RDW: 15.1 % (ref 12.3–15.4)
WBC: 8.7 10*3/uL (ref 3.4–10.8)

## 2018-01-30 LAB — URIC ACID: Uric Acid: 5 mg/dL (ref 2.5–7.1)

## 2018-01-31 NOTE — Progress Notes (Signed)
Uric acid within desirable range.

## 2018-02-15 ENCOUNTER — Other Ambulatory Visit (INDEPENDENT_AMBULATORY_CARE_PROVIDER_SITE_OTHER): Payer: Self-pay | Admitting: Rheumatology

## 2018-02-15 ENCOUNTER — Telehealth: Payer: Self-pay | Admitting: Rheumatology

## 2018-02-15 NOTE — Telephone Encounter (Signed)
Last visit: 12/14/17 Next visit: due March 2019. Message sent to the front to schedule patient. Labs:12/27/17 Hgb 10.9 Hct 33.7, elevated glucose. Will monitor.   Okay to refill per Dr. Corliss Skainseveshwar

## 2018-02-15 NOTE — Telephone Encounter (Signed)
Patient needs a refill generic Fosamax 70mg  sent to [facility]. Patient is due for next dose on Saturday.

## 2018-02-16 NOTE — Telephone Encounter (Signed)
Patient advised prescription for Fosamax was sent to the pharmacy 02/15/18.

## 2018-04-07 ENCOUNTER — Other Ambulatory Visit: Payer: Self-pay | Admitting: Rheumatology

## 2018-04-07 ENCOUNTER — Encounter: Payer: Self-pay | Admitting: Rheumatology

## 2018-04-07 NOTE — Telephone Encounter (Addendum)
Last visit: 12/14/17 Next visit: 05/18/18 Labs: 12/27/17 Hgb 10.9 Hct 33.7, elevated glucose. Will monitor.  PLQ eye exam: 12/23/2016 Normal.   Spoke with patient's daughter and she will advise patient we need an update PLQ eye exam.   Okay to refill 30 day supply PLQ?

## 2018-04-07 NOTE — Telephone Encounter (Signed)
ok 

## 2018-04-11 ENCOUNTER — Encounter: Payer: Self-pay | Admitting: Rheumatology

## 2018-04-11 ENCOUNTER — Telehealth: Payer: Self-pay | Admitting: Rheumatology

## 2018-04-11 MED ORDER — LEFLUNOMIDE 20 MG PO TABS: 20.0000 mg | ORAL_TABLET | Freq: Every day | ORAL | 0 refills | Status: DC

## 2018-04-11 NOTE — Telephone Encounter (Signed)
Patient called requesting prescription refill of Leflunomide to be sent to [facility] in [place].

## 2018-04-11 NOTE — Telephone Encounter (Signed)
Last visit: 12/14/17 Next visit: 05/18/18 Labs:12/27/17 Hgb 10.9 Hct 33.7, elevated glucose. Will monitor.   Okay to refill per Dr. Corliss Skainseveshwar

## 2018-04-13 ENCOUNTER — Telehealth: Payer: Self-pay | Admitting: Pharmacist

## 2018-04-13 NOTE — Telephone Encounter (Signed)
Received a Prior Authorization request from [employer] for [employer] Authorization has been submitted to patient's insurance via Cover My Meds. Will update once we receive a response.

## 2018-04-13 NOTE — Telephone Encounter (Signed)
Please start BIV for Forteo.  Patient T-score -3.4 and 11% decrease in BMD in 3 years.  She has been on Fosamax since January 2018.

## 2018-04-14 NOTE — Telephone Encounter (Signed)
Received a fax from [facility] regarding a prior authorization for [place]. Authorization has been APPROVED from 04/13/2018 to 03/02/2019.   Will send document to scan center.  Authorization # [id number] Phone # [phone]  Ran test claim: 1 month supply is $66.57- claim met patient's deductible. So could possibly be cheaper in the future.  Patient has Medicare plan so is eligible to apply for [employer] PAP is she can not afford copay.  8:11 AM Beatriz Chancellor, CPhT

## 2018-04-15 ENCOUNTER — Telehealth: Payer: Self-pay | Admitting: *Deleted

## 2018-04-15 NOTE — Telephone Encounter (Signed)
Patient advised bone density test results show Osteoporosis, Currently on Fosamax. Dr. Corliss Skains would like to discuss other treatment options. Patient scheduled for 04/18/18 at 11 am.

## 2018-04-18 ENCOUNTER — Encounter: Payer: Self-pay | Admitting: Rheumatology

## 2018-04-18 ENCOUNTER — Other Ambulatory Visit: Payer: Self-pay | Admitting: Pharmacist

## 2018-04-18 ENCOUNTER — Ambulatory Visit: Payer: Medicare HMO | Admitting: Rheumatology

## 2018-04-18 VITALS — BP 160/89 | HR 87 | Resp 15 | Ht 60.0 in | Wt 150.0 lb

## 2018-04-18 DIAGNOSIS — M0609 Rheumatoid arthritis without rheumatoid factor, multiple sites: Secondary | ICD-10-CM | POA: Diagnosis not present

## 2018-04-18 DIAGNOSIS — Z8619 Personal history of other infectious and parasitic diseases: Secondary | ICD-10-CM

## 2018-04-18 DIAGNOSIS — M2450 Contracture, unspecified joint: Secondary | ICD-10-CM

## 2018-04-18 DIAGNOSIS — M1A09X0 Idiopathic chronic gout, multiple sites, without tophus (tophi): Secondary | ICD-10-CM | POA: Diagnosis not present

## 2018-04-18 DIAGNOSIS — I10 Essential (primary) hypertension: Secondary | ICD-10-CM

## 2018-04-18 DIAGNOSIS — M810 Age-related osteoporosis without current pathological fracture: Secondary | ICD-10-CM | POA: Diagnosis not present

## 2018-04-18 DIAGNOSIS — Z79899 Other long term (current) drug therapy: Secondary | ICD-10-CM | POA: Diagnosis not present

## 2018-04-18 DIAGNOSIS — M170 Bilateral primary osteoarthritis of knee: Secondary | ICD-10-CM

## 2018-04-18 DIAGNOSIS — Z87442 Personal history of urinary calculi: Secondary | ICD-10-CM

## 2018-04-18 DIAGNOSIS — D649 Anemia, unspecified: Secondary | ICD-10-CM

## 2018-04-18 DIAGNOSIS — Z8639 Personal history of other endocrine, nutritional and metabolic disease: Secondary | ICD-10-CM

## 2018-04-18 NOTE — Progress Notes (Signed)
Office Visit Note  Patient: Mary [name]             Date of Birth: [birth year]           MRN: [id number]             PCP: Dema Severin, NP Referring: Dema Severin, NP Visit Date: 04/18/2018 Occupation: @GUAROCC @  Subjective:  Discuss treatment options   History of Present Illness: Mary [name] is a 78 y.o. female with history of seronegative rheumatoid arthritis, gout, and osteoporosis. She is on Arava 20 mg daily and Plaquenil 200 mg twice daily. She denies any rheumatoid arthritis flares. Reports that her right hand continues to swell throughout the day.  She still has discomfort in her right knee and states cortisone injections are not helpful. She no longers uses a walker at home and uses wheelchair as it feels like her knee gives out on her.  She is wearing a brace daily and has noticed minimal relief. She denies any gout flares and continues to take allopurinol 200 mg daily. She continues to take fosamax 70 mg weekly for osteoporosis.  Activities of Daily Living:  Patient reports morning stiffness for 0  minute.   Patient Denies nocturnal pain.  Difficulty dressing/grooming: Reports Difficulty climbing stairs: Reports Difficulty getting out of chair: Reports Difficulty using hands for taps, buttons, cutlery, and/or writing: Reports  Review of Systems  Constitutional: Negative for fatigue.  HENT: Negative for mouth sores, mouth dryness and nose dryness.   Eyes: Negative for pain, visual disturbance and dryness.  Respiratory: Negative for cough, hemoptysis, shortness of breath and difficulty breathing.   Cardiovascular: Negative for chest pain, palpitations, hypertension and swelling in legs/feet.  Gastrointestinal: Negative for blood in stool, constipation and diarrhea.  Endocrine: Negative for increased urination.  Genitourinary: Negative for painful urination.  Musculoskeletal: Negative for arthralgias, joint pain, joint swelling, myalgias, muscle weakness, morning  stiffness, muscle tenderness and myalgias.  Skin: Negative for color change, pallor, rash, hair loss, nodules/bumps, skin tightness, ulcers and sensitivity to sunlight.  Allergic/Immunologic: Negative for susceptible to infections.  Neurological: Negative for dizziness, numbness, headaches and weakness.  Hematological: Negative for swollen glands.  Psychiatric/Behavioral: Negative for depressed mood and sleep disturbance. The patient is not nervous/anxious.     PMFS History:  Patient Active Problem List   Diagnosis Date Noted  . Mild persistent asthma without complication 11/18/2017  . Age-related osteoporosis without current pathological fracture 11/19/2016  . Acute respiratory failure (HCC)   . Rheumatoid lung (HCC)   . Hypoxia 09/03/2016  . Essential hypertension 09/03/2016  . Urinary tract infection 09/03/2016  . 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 High risk medication use 02/28/2016  . Idiopathic chronic gout of multiple sites without tophus 02/28/2016  . Primary osteoarthritis of both knees 02/28/2016  . Flexion contractures 02/28/2016  . History of hypothyroidism 02/28/2016  . History of diabetes mellitus 02/28/2016  . History of shingles 02/28/2016  . History of hyperlipidemia 02/28/2016    Past Medical History:  Diagnosis Date  . Complication of anesthesia   . Diabetes mellitus without complication (HCC)   . Dyspnea   . Gout   . High cholesterol   . History of kidney stones   . Hypertension   . Hypoxia 08/2016  . Osteoarthritis   . PONV (postoperative nausea and vomiting)   . Rheumatoid arthritis (HCC)     Family History  Problem Relation Age of Onset  . Arthritis  Brother   . Arthritis Sister   . Arthritis Sister   . Arthritis Brother   . Arthritis Brother   . Arthritis Brother    Past Surgical History:  Procedure Laterality Date  . ABDOMINAL HYSTERECTOMY     Social History   Social History  Narrative  . Not on file   Immunization History  Administered Date(s) Administered  . Influenza, High Dose Seasonal PF 11/26/2015  . Influenza-Unspecified 12/07/2016  . Pneumococcal-Unspecified 09/04/2015     Objective: Vital Signs: BP (!) 160/89 (BP Location: Left Arm, Patient Position: Sitting, Cuff Size: Normal)   Pulse 87   Resp 15   Ht 5' (1.524 m) Comment: per patient, in wheelchair  Wt 150 lb (68 kg) Comment: per patient, in wheelchair  BMI 29.29 kg/m    Physical Exam Vitals signs and nursing note reviewed.  Constitutional:      Appearance: She is well-developed.  HENT:     Head: Normocephalic and atraumatic.  Eyes:     Conjunctiva/sclera: Conjunctivae normal.  Neck:     Musculoskeletal: Normal range of motion.  Cardiovascular:     Rate and Rhythm: Normal rate and regular rhythm.     Heart sounds: Normal heart sounds.  Pulmonary:     Effort: Pulmonary effort is normal.     Breath sounds: Normal breath sounds.  Abdominal:     General: Bowel sounds are normal.     Palpations: Abdomen is soft.  Lymphadenopathy:     Cervical: No cervical adenopathy.  Skin:    General: Skin is warm and dry.     Capillary Refill: Capillary refill takes less than 2 seconds.  Neurological:     Mental Status: She is alert and oriented to person, place, and time.  Psychiatric:        Behavior: Behavior normal.      Musculoskeletal Exam: C-spine limited ROM. Thoracic kyphosis noted.  Unable to assess lumbar ROM due to patient being in wheelchair.  She has no midline spinal tenderness.  Shoulder joint abduction to about 90 degrees bilaterally.  Elbow joint contractures bilaterally.  Very limited ROM of both wrist joints.  Right hand/wrist swelling related to edema.  Tenderness of left wrist joint on exam. Subluxation of MCPs.  Contractures of PIP joints.  Hip joints difficult to assess in wheelchair.  Knee joints limited ROM.  Right knee warmth.  Bilateral knee joint crepitus.  Pedal  edema bilaterally.    CDAI Exam: CDAI Score: 0.6  Patient Global Assessment: 3 (mm); Provider Global Assessment: 3 (mm) Swollen: 0 ; Tender: 0  Joint Exam   Not documented   There is currently no information documented on the homunculus. Go to the Rheumatology activity and complete the homunculus joint exam.  Investigation: No additional findings.  Imaging: No results found.  Recent Labs: Lab Results  Component Value Date   WBC 8.7 01/28/2018   HGB 10.4 (L) 01/28/2018   PLT 208 01/28/2018   NA 137 01/28/2018   K 4.0 01/28/2018   CL 108 (H) 01/28/2018   CO2 18 (L) 01/28/2018   GLUCOSE 182 (H) 01/28/2018   BUN 14 01/28/2018   CREATININE 0.48 (L) 01/28/2018   BILITOT 0.5 01/28/2018   ALKPHOS 81 01/28/2018   AST 11 01/28/2018   ALT 8 01/28/2018   PROT 6.5 01/28/2018   ALBUMIN 4.0 01/28/2018   CALCIUM 8.7 01/28/2018   GFRAA 111 01/28/2018    Speciality Comments: PLQ eye exam: 12/23/2016 Normal. [facility]. Follow up in  6 months.  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 She has severe rheumatoid arthritis with several joint contractures.  She has bilateral elbow joint contractures and PIP joint contractures. She has not had any recent rheumatoid arthritis flares.  She is taking Arava 20 mg po daily and PLQ 200 mg BID M-F. She continues to have chronic right knee pain with warmth.  She has started wearing a brace and is solely using a wheelchair.  She will continue on the above current treatment regimen.  She was advised to notify us if she develops increased joint pain or joint swelling.  She will follow up in 5 months.   High risk medication use - Arava 20 mg, PLQ 200 mg BID M-F.eye exam: 12/23/2016. CBC and CMP will be drawn today to monitor for drug toxicity. - Plan: CBC with  Differential/Platelet, COMPLETE METABOLIC PANEL WITH GFR  Idiopathic chronic gout of multiple sites without tophus: She has not had any recent gout flares.  She is clinically doing well on Allopurinol 200 mg po daily.  She does not need any refills at this time.  She was advised to notify us if she develops a gout flare.   Age-related osteoporosis without current pathological fracture - DEXA 10/17:L spine shows a T score of -3.6. DEXA on 04/11/2018 T-score: -3.4, BMD: 0.476, -11% change for RFN. Recent DEXA results were discussed with the patient. She has been taking Fosamax since January 2018.  We discussed switching her from Fosamax to [facility].  Indications, contraindications, and potential side effects of Forteo were discussed.  We demonstrated how to use the injectable device. She has no history of cancer.  GFR 111 on 01/28/18 and Calcium 8.1.  Vitamin D was 62 on 04/20/17. All questions were addressed. We will obtain lab work today. We will apply for Forteo through her insurance.  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  She has severely limited ROM of both wrist joints.   Primary osteoarthritis of both knees: She has chronic pain in both knee joints.  Right knee warmth on exam.  She has limited ROM of both knee joints.  She is no longer using a walker and only uses wheelchair now.  She is wearing a right knee brace which has provided minimal relief. She had no relief after the cortisone injection on 07/14/17.    Other medical conditions are listed as follows:    History of diabetes mellitus  History of hypothyroidism  History of hyperlipidemia  Essential hypertension  History of shingles  Anemia, unspecified type  History of kidney stones   Orders: Orders  Placed This Encounter  Procedures  . Parathyroid hormone, intact (no Ca)  . VITAMIN D 25 Hydroxy (Vit-D Deficiency, Fractures)  . Phosphorus  . TSH  . CBC with Differential/Platelet  . COMPLETE METABOLIC PANEL WITH GFR   No orders of the defined types were placed in this encounter.   Face-to-face time spent with patient was 30 minutes. Greater than 50% of time was spent in counseling and coordination of care.  Follow-Up Instructions: Return in about 5 months (around 09/16/2018) for Rheumatoid arthritis, Gout.   Pollyann SavoyShaili Deveshwar, MD  Note -  This record has been created using [employer].  Chart creation errors have been sought, but may not always  have been located. Such creation errors do not reflect on  the standard of medical care.

## 2018-04-18 NOTE — Telephone Encounter (Signed)
Prescription for Forteo and pen needles pending lab results.

## 2018-04-19 LAB — COMPLETE METABOLIC PANEL WITH GFR
AG Ratio: 1.4 (calc) (ref 1.0–2.5)
ALT: 7 U/L (ref 6–29)
AST: 12 U/L (ref 10–35)
Albumin: 3.9 g/dL (ref 3.6–5.1)
Alkaline phosphatase (APISO): 74 U/L (ref 37–153)
BUN/Creatinine Ratio: 26 (calc) — ABNORMAL HIGH (ref 6–22)
BUN: 13 mg/dL (ref 7–25)
CO2: 24 mmol/L (ref 20–32)
Calcium: 8.8 mg/dL (ref 8.6–10.4)
Chloride: 109 mmol/L (ref 98–110)
Creat: 0.5 mg/dL — ABNORMAL LOW (ref 0.60–0.93)
GFR, EST AFRICAN AMERICAN: 110 mL/min/{1.73_m2} (ref >=60)
GFR, Est Non African American: 95 mL/min/{1.73_m2} (ref >=60)
Globulin: 2.7 g/dL (calc) (ref 1.9–3.7)
Glucose, Bld: 125 mg/dL — ABNORMAL HIGH (ref 65–99)
Potassium: 4.2 mmol/L (ref 3.5–5.3)
Sodium: 140 mmol/L (ref 135–146)
TOTAL PROTEIN: 6.6 g/dL (ref 6.1–8.1)
Total Bilirubin: 0.4 mg/dL (ref 0.2–1.2)

## 2018-04-19 LAB — CBC WITH DIFFERENTIAL/PLATELET
Absolute Monocytes: 662 cells/uL (ref 200–950)
Basophils Absolute: 69 cells/uL (ref 0–200)
Basophils Relative: 1.1 %
Eosinophils Absolute: 441 cells/uL (ref 15–500)
Eosinophils Relative: 7 %
HCT: 33.8 % — ABNORMAL LOW (ref 35.0–45.0)
Hemoglobin: 10.7 g/dL — ABNORMAL LOW (ref 11.7–15.5)
Lymphs Abs: 1317 cells/uL (ref 850–3900)
MCH: 28.5 pg (ref 27.0–33.0)
MCHC: 31.7 g/dL — ABNORMAL LOW (ref 32.0–36.0)
MCV: 89.9 fL (ref 80.0–100.0)
MPV: 13.6 fL — AB (ref 7.5–12.5)
Monocytes Relative: 10.5 %
Neutro Abs: 3812 cells/uL (ref 1500–7800)
Neutrophils Relative %: 60.5 %
Platelets: 215 10*3/uL (ref 140–400)
RBC: 3.76 10*6/uL — ABNORMAL LOW (ref 3.80–5.10)
RDW: 15.5 % — ABNORMAL HIGH (ref 11.0–15.0)
Total Lymphocyte: 20.9 %
WBC: 6.3 10*3/uL (ref 3.8–10.8)

## 2018-04-19 LAB — PARATHYROID HORMONE, INTACT (NO CA): PTH: 66 pg/mL — ABNORMAL HIGH (ref 14–64)

## 2018-04-19 LAB — TSH: TSH: 3.15 mIU/L (ref 0.40–4.50)

## 2018-04-19 LAB — VITAMIN D 25 HYDROXY (VIT D DEFICIENCY, FRACTURES): Vit D, 25-Hydroxy: 48 ng/mL (ref 30–100)

## 2018-04-19 LAB — PHOSPHORUS: PHOSPHORUS: 2.5 mg/dL (ref 2.1–4.3)

## 2018-04-19 NOTE — Progress Notes (Signed)
Ok to proceed with applying for Forteo?

## 2018-04-20 MED ORDER — "PEN NEEDLES 3/16"" 31G X 5 MM MISC": 2 refills | Status: DC

## 2018-04-20 MED ORDER — TERIPARATIDE (RECOMBINANT) 600 MCG/2.4ML ~~LOC~~ SOLN: 20.0000 ug | Freq: Every day | SUBCUTANEOUS | 2 refills | Status: DC

## 2018-04-20 NOTE — Telephone Encounter (Signed)
Lab results within normal limits.  Prescription sent to [facility] per patient request.

## 2018-04-26 ENCOUNTER — Telehealth: Payer: Self-pay | Admitting: Rheumatology

## 2018-04-26 NOTE — Telephone Encounter (Signed)
Attempted to contact Selena Batten and left message with Eunice Blase to have Selena Batten contact the office. Paperwork was signed and faxed back to their office 09/04/17.

## 2018-04-26 NOTE — Telephone Encounter (Signed)
Kim from [facility] left a voicemail stating the physical therapist had contacted Dr. Fatima Sanger office requesting an extension of time for home physical therapy orders for June 29.  We never received that order back signed by Dr. Corliss Skains.  It is now 151 days outstanding and we need to release billing.  We will be faxing over another order today to be signed and sent back to our office.   If you have any questions, please call back at #[phone]

## 2018-05-04 NOTE — Progress Notes (Deleted)
Office Visit Note  Patient: Mary [name]             Date of Birth: [birth year]           MRN: [id number]             PCP: Dema Severin, NP Referring: Dema Severin, NP Visit Date: 05/18/2018 Occupation: @GUAROCC @  Subjective:  No chief complaint on file.   History of Present Illness: Mary [name] is a 78 y.o. female ***   Activities of Daily Living:  Patient reports morning stiffness for *** {minute/hour:19697}.   Patient {ACTIONS;DENIES/REPORTS:21021675::"Denies"} nocturnal pain.  Difficulty dressing/grooming: {ACTIONS;DENIES/REPORTS:21021675::"Denies"} Difficulty climbing stairs: {ACTIONS;DENIES/REPORTS:21021675::"Denies"} Difficulty getting out of chair: {ACTIONS;DENIES/REPORTS:21021675::"Denies"} Difficulty using hands for taps, buttons, cutlery, and/or writing: {ACTIONS;DENIES/REPORTS:21021675::"Denies"}  No Rheumatology ROS completed.   PMFS History:  Patient Active Problem List   Diagnosis Date Noted  . Mild persistent asthma without complication 11/18/2017  . Age-related osteoporosis without current pathological fracture 11/19/2016  . Acute respiratory failure (HCC)   . Rheumatoid lung (HCC)   . Hypoxia 09/03/2016  . Essential hypertension 09/03/2016  . Urinary tract infection 09/03/2016  . 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 High risk medication use 02/28/2016  . Idiopathic chronic gout of multiple sites without tophus 02/28/2016  . Primary osteoarthritis of both knees 02/28/2016  . Flexion contractures 02/28/2016  . History of hypothyroidism 02/28/2016  . History of diabetes mellitus 02/28/2016  . History of shingles 02/28/2016  . History of hyperlipidemia 02/28/2016    Past Medical History:  Diagnosis Date  . Complication of anesthesia   . Diabetes mellitus without complication (HCC)   . Dyspnea   . Gout   . High cholesterol   . History of kidney stones   . Hypertension   . Hypoxia  08/2016  . Osteoarthritis   . PONV (postoperative nausea and vomiting)   . Rheumatoid arthritis (HCC)     Family History  Problem Relation Age of Onset  . Arthritis Brother   . Arthritis Sister   . Arthritis Sister   . Arthritis Brother   . Arthritis Brother   . Arthritis Brother    Past Surgical History:  Procedure Laterality Date  . ABDOMINAL HYSTERECTOMY     Social History   Social History Narrative  . Not on file   Immunization History  Administered Date(s) Administered  . Influenza, High Dose Seasonal PF 11/26/2015  . Influenza-Unspecified 12/07/2016  . Pneumococcal-Unspecified 09/04/2015     Objective: Vital Signs: There were no vitals taken for this visit.   Physical Exam   Musculoskeletal Exam: ***  CDAI Exam: CDAI Score: Not documented Patient Global Assessment: Not documented; Provider Global Assessment: Not documented Swollen: Not documented; Tender: Not documented Joint Exam   Not documented   There is currently no information documented on the homunculus. Go to the Rheumatology activity and complete the homunculus joint exam.  Investigation: No additional findings.  Imaging: No results found.  Recent Labs: Lab Results  Component Value Date   WBC 6.3 04/18/2018   HGB 10.7 (L) 04/18/2018   PLT 215 04/18/2018   NA 140 04/18/2018   K 4.2 04/18/2018   CL 109 04/18/2018   CO2 24 04/18/2018   GLUCOSE 125 (H) 04/18/2018   BUN 13 04/18/2018   CREATININE 0.50 (L) 04/18/2018   BILITOT 0.4 04/18/2018   ALKPHOS 81 01/28/2018   AST 12 04/18/2018   ALT 7 04/18/2018   PROT 6.6 04/18/2018  ALBUMIN 4.0 01/28/2018   CALCIUM 8.8 04/18/2018   GFRAA 110 04/18/2018    Speciality Comments: PLQ eye exam: 12/23/2016 Normal. [facility]. Follow up in 6 months.  Procedures:  No procedures performed Allergies: Aspirin and Codeine   Assessment / Plan:     Visit Diagnoses: Rheumatoid arthritis of multiple sites with negative rheumatoid factor  (HCC)  High risk medication use - Arava 20 mg, PLQ 200 mg BID M-F.eye exam: 12/23/2016.   Idiopathic chronic gout of multiple sites without tophus - Allopurinol 200 mg po daily.  Age-related osteoporosis without current pathological fracture - DEXA 10/17:L spine shows a T score of -3.6. DEXA on 04/11/2018 T-score: -3.4, BMD: 0.476, -11% change for RFN.Previously on Fosamax. on Forteo?  Flexion contractures  Primary osteoarthritis of both knees  History of diabetes mellitus  History of hypothyroidism  History of hyperlipidemia  Essential hypertension  History of shingles  History of kidney stones   Orders: No orders of the defined types were placed in this encounter.  No orders of the defined types were placed in this encounter.   Face-to-face time spent with patient was *** minutes. Greater than 50% of time was spent in counseling and coordination of care.  Follow-Up Instructions: No follow-ups on file.   Gearldine Bienenstock, PA-C  Note - This record has been created using Dragon software.  Chart creation errors have been sought, but may not always  have been located. Such creation errors do not reflect on  the standard of medical care.

## 2018-05-11 ENCOUNTER — Other Ambulatory Visit: Payer: Self-pay | Admitting: Rheumatology

## 2018-05-11 MED ORDER — LEFLUNOMIDE 20 MG PO TABS: 20.0000 mg | ORAL_TABLET | Freq: Every day | ORAL | 0 refills | Status: DC

## 2018-05-11 NOTE — Telephone Encounter (Signed)
Last Visit: 04/18/18 Next Visit: 05/18/18  Labs: 04/18/18 Hgb 10.7, RBC 3.76, HCT 33.8 MCHC 31.7 RDW 15.5 MPV 13.6, Creat. 0.50   Okay to refill Arava?

## 2018-05-11 NOTE — Telephone Encounter (Signed)
ok 

## 2018-05-11 NOTE — Telephone Encounter (Signed)
Patient called requesting prescription refill of Leflunomide to be sent to [facility] in [place].

## 2018-05-13 ENCOUNTER — Telehealth: Payer: Self-pay | Admitting: Rheumatology

## 2018-05-13 NOTE — Progress Notes (Signed)
Office Visit Note  Patient: Mary [name]             Date of Birth: [birth year]           MRN: [id number]             PCP: Dema Severin, NP Referring: Dema Severin, NP Visit Date: 05/16/2018 Occupation: @GUAROCC @  Subjective:  Pain in both hands   History of Present Illness: Levonne Utech is a 78 y.o. female with history of seronegative rheumatoid arthritis, gout, osteoarthritis, and osteoporosis.  She is currently taking Arava 20 mg po daily.  She went for a PLQ eye exam and was unable to perform the exam, so it was recommended that she discontinue PLQ.  She discontinued PLQ last Wednesday.  She has not noticed increased pain or joint swelling.  She continues to have chronic pain in both hans and right knee joint.  She continues to wear a right knee joint brace and is in a wheelchair.  She denies any recent gout flares. She continues to take Allopurinol 200 mg po daily for management of gout.   She is on Forteo sq daily injections for management of osteoporosis.  She has been tolerating Forteo without any side effects.   Activities of Daily Living:  Patient reports morning stiffness for minutes.   Patient Denies nocturnal pain.  Difficulty dressing/grooming: Denies Difficulty climbing stairs: Reports Difficulty getting out of chair: Reports Difficulty using hands for taps, buttons, cutlery, and/or writing: Reports  Review of Systems  Constitutional: Negative for fatigue.  HENT: Negative for mouth sores, mouth dryness and nose dryness.   Eyes: Negative for pain, visual disturbance and dryness.  Respiratory: Negative for cough, hemoptysis, shortness of breath and difficulty breathing.   Cardiovascular: Negative for chest pain, palpitations, hypertension and swelling in legs/feet.  Gastrointestinal: Negative for blood in stool, constipation and diarrhea.  Endocrine: Negative for increased urination.  Genitourinary: Negative for painful urination.  Musculoskeletal: Positive  for arthralgias, joint pain and morning stiffness. Negative for joint swelling, myalgias, muscle weakness, muscle tenderness and myalgias.  Skin: Negative for color change, pallor, rash, hair loss, nodules/bumps, skin tightness, ulcers and sensitivity to sunlight.  Neurological: Negative for dizziness, numbness, headaches and weakness.  Hematological: Negative for swollen glands.  Psychiatric/Behavioral: Negative for depressed mood and sleep disturbance. The patient is not nervous/anxious.     PMFS History:  Patient Active Problem List   Diagnosis Date Noted  . Mild persistent asthma without complication 11/18/2017  . Age-related osteoporosis without current pathological fracture 11/19/2016  . Acute respiratory failure (HCC)   . Rheumatoid lung (HCC)   . Hypoxia 09/03/2016  . Essential hypertension 09/03/2016  . Urinary tract infection 09/03/2016  . 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 High risk medication use 02/28/2016  . Idiopathic chronic gout of multiple sites without tophus 02/28/2016  . Primary osteoarthritis of both knees 02/28/2016  . Flexion contractures 02/28/2016  . History of hypothyroidism 02/28/2016  . History of diabetes mellitus 02/28/2016  . History of shingles 02/28/2016  . History of hyperlipidemia 02/28/2016    Past Medical History:  Diagnosis Date  . Complication of anesthesia   . Diabetes mellitus without complication (HCC)   . Dyspnea   . Gout   . High cholesterol   . History of kidney stones   . Hypertension   . Hypoxia 08/2016  . Osteoarthritis   . PONV (postoperative nausea and vomiting)   . Rheumatoid arthritis (  HCC)     Family History  Problem Relation Age of Onset  . Arthritis Brother   . Arthritis Sister   . Arthritis Sister   . Arthritis Brother   . Arthritis Brother   . Arthritis Brother    Past Surgical History:  Procedure Laterality Date  . ABDOMINAL HYSTERECTOMY     Social  History   Social History Narrative  . Not on file   Immunization History  Administered Date(s) Administered  . Influenza, High Dose Seasonal PF 11/26/2015  . Influenza-Unspecified 12/07/2016  . Pneumococcal-Unspecified 09/04/2015     Objective: Vital Signs: BP (!) 173/95 (BP Location: Left Arm, Patient Position: Sitting, Cuff Size: Normal)   Pulse 87   Resp 16   Ht 5' (1.524 m) Comment: per patient. patient in wheelchair  Wt 152 lb (68.9 kg) Comment: per patient. patient in wheelchair.  BMI 29.69 kg/m    Physical Exam Vitals signs and nursing note reviewed.  Constitutional:      Appearance: She is well-developed.  HENT:     Head: Normocephalic and atraumatic.  Eyes:     Conjunctiva/sclera: Conjunctivae normal.  Neck:     Musculoskeletal: Normal range of motion.  Cardiovascular:     Rate and Rhythm: Normal rate and regular rhythm.     Heart sounds: Normal heart sounds.  Pulmonary:     Effort: Pulmonary effort is normal.     Breath sounds: Normal breath sounds.  Abdominal:     General: Bowel sounds are normal.     Palpations: Abdomen is soft.  Lymphadenopathy:     Cervical: No cervical adenopathy.  Skin:    General: Skin is warm and dry.     Capillary Refill: Capillary refill takes less than 2 seconds.  Neurological:     Mental Status: She is alert and oriented to person, place, and time.  Psychiatric:        Behavior: Behavior normal.      Musculoskeletal Exam: C-spine limited range of motion.  Thoracic kyphosis noted.  Unable to assess lumbar range of motion while in wheelchair.  No midline spinal tenderness.  Shoulder abduction to about 90 degrees bilaterally.  Elbow joint contractures noted bilaterally.  She has limited range of motion of bilateral wrist joints.  She has right hand and wrist edema.  Left wrist is no tenderness or synovitis.  Subluxation of MCP joints.  Contracture of the PIP joints.  No synovitis of MCP or PIP joints.  Hip joints difficult to  assess well in the wheelchair.  Knee joints have limited range of motion.  Right knee warmth.  Bilateral knee crepitus.  Pedal edema bilaterally.  CDAI Exam: CDAI Score: 1.2  Patient Global Assessment: 6 (mm); Provider Global Assessment: 6 (mm) Swollen: 0 ; Tender: 0  Joint Exam   Not documented   There is currently no information documented on the homunculus. Go to the Rheumatology activity and complete the homunculus joint exam.  Investigation: No additional findings.  Imaging: No results found.  Recent Labs: Lab Results  Component Value Date   WBC 6.3 04/18/2018   HGB 10.7 (L) 04/18/2018   PLT 215 04/18/2018   NA 140 04/18/2018   K 4.2 04/18/2018   CL 109 04/18/2018   CO2 24 04/18/2018   GLUCOSE 125 (H) 04/18/2018   BUN 13 04/18/2018   CREATININE 0.50 (L) 04/18/2018   BILITOT 0.4 04/18/2018   ALKPHOS 81 01/28/2018   AST 12 04/18/2018   ALT 7 04/18/2018   PROT  6.6 04/18/2018   ALBUMIN 4.0 01/28/2018   CALCIUM 8.8 04/18/2018   GFRAA 110 04/18/2018    Speciality Comments: PLQ eye exam: 12/23/2016 Normal. [facility]. Follow up in 6 months.  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  She has not had any recent rheumatoid arthritis flares.  She has flexion contractures of bilateral elbow joints.  She subluxation of all MCP joints and contractures of PIP joints.  She has limited abduction of bilateral shoulders to 90 degrees.  She has chronic pain in bilateral knee joints but no warmth or effusion was noted today.  She is clinically doing well on Arava 20 mg by mouth daily.  She discontinued Plaquenil 5 days ago per recommendations of ophthalmologist.  Since she is clinically doing well at this time we will see how she does on Arava monotherapy.  If she develops increased joint pain or joint  swelling we will consider adding on sulfasalazine.  Indications, contraindications, potential side effects of sulfasalazine were discussed.  Consent was obtained today.  She will notify us if she would like to start on sulfasalazine.  She will follow-up in the office in 5 months.  Medication counseling:  Does the patient have an allergy to sulfa drugs? No Patient was counseled on the purpose, proper use, and adverse effects of sulfasalazine including risk of infection and chance of nausea, headache, and sun sensitivity.  Also discussed risk of skin rash and advised patient to stop the medication and let us know if she develops a rash. Also discussed for the potential of discoloration of the urine, sweat, or tears.  Advised patient to avoid live vaccines.  Recommend annual influenza, Pneumovax 23, Prevnar 13, and Shingrix as indicated. Reviewed the importance of frequent labs to monitor liver, kidneys, and blood counts.   Provided patient with educational materials on sulfasalazine and answered all questions.  Patient consented to sulfasalazine use, and consent will be uploaded into the media tab.    High risk medication use - Arava 20 mg, d/c PLQ 200 mg BID M-F per ophthalmology recommendation.  Discussed adding on SSZ if she starts having increased joint pain and joint swelling.- Plan: Hepatitis panel, acute, HIV Antibody (routine testing w rflx), QuantiFERON-TB Gold Plus, Serum protein electrophoresis with reflex, IgG, IgA, IgM, Thiopurine methyltransferase(tpmt)rbc, Glucose 6 phosphate dehydrogenase  Idiopathic chronic gout of multiple sites without tophus: She has not had any recent gout flares.  She continues take allopurinol 20 mg by mouth daily.  She was advised to notify us if she develops any signs or symptoms of a gout flare.  Uric acid was 5.0 on 01/28/2018.  She does not need any refills of allopurinol at this time.  Age-related osteoporosis without current pathological fracture - She was  started on Forteo on 04/20/18.  She has been tolerating it well without any side effects.  DEXA 10/17:L spine shows a T score of -3.6. DEXA on 04/11/2018 T-score: -3.4, BMD: 0.476, -11% change for RFN.   Primary osteoarthritis of both knees: Chronic pain. She has limited ROM of both knee joints.  Bilateral knee joint crepitus.  She is wearing a right knee joint brace.  No warmth or effusion of knee joints noted.  Flexion contractures: Chronic but stable.   Other medical conditions are listed as follows:   Essential hypertension  History of  hypothyroidism  History of shingles  History of hyperlipidemia  History of diabetes mellitus  History of kidney stones  Anemia, unspecified type   Orders: No orders of the defined types were placed in this encounter.  No orders of the defined types were placed in this encounter.   Follow-Up Instructions: Return in about 5 months (around 10/16/2018) for Rheumatoid arthritis, Osteoporosis, Osteoarthritis.   Gearldine Bienenstock, PA-C   I examined and evaluated the patient with Sherron Ales PA.  Patient had no synovitis on examination.  She had discontinued Plaquenil as per recommendations of her ophthalmologist.  She has not noticed any increased symptoms.  Although she is concerned about future problems coming off Plaquenil.  At this point we will continue on Arava 20 mg p.o. daily.  Although we discussed possible use of sulfasalazine in future.  Handout was given and consent was taken.  In case we have to add sulfasalazine I would consider 500 mg p.o. twice daily along with Arava.  The plan of care was discussed as noted above.  Pollyann Savoy, MD  Note - This record has been created using Animal nutritionist.  Chart creation errors have been sought, but may not always  have been located. Such creation errors do not reflect on  the standard of medical care.

## 2018-05-13 NOTE — Telephone Encounter (Signed)
Patient needs a refill sent to [facility] on Plaquenil.

## 2018-05-13 NOTE — Telephone Encounter (Signed)
Patient advised per eye doctor they are unable to perform the portion of the eye exam need to for her to continue the PLQ. Patient advised we will need to schedule her an appointment to discuss other treatment options. Patient has been scheduled for 05/16/18.

## 2018-05-16 ENCOUNTER — Ambulatory Visit: Payer: Medicare HMO | Admitting: Rheumatology

## 2018-05-16 ENCOUNTER — Encounter: Payer: Self-pay | Admitting: Physician Assistant

## 2018-05-16 ENCOUNTER — Other Ambulatory Visit: Payer: Self-pay

## 2018-05-16 VITALS — BP 173/95 | HR 87 | Resp 16 | Ht 60.0 in | Wt 152.0 lb

## 2018-05-16 DIAGNOSIS — D649 Anemia, unspecified: Secondary | ICD-10-CM

## 2018-05-16 DIAGNOSIS — M810 Age-related osteoporosis without current pathological fracture: Secondary | ICD-10-CM | POA: Diagnosis not present

## 2018-05-16 DIAGNOSIS — M1A09X0 Idiopathic chronic gout, multiple sites, without tophus (tophi): Secondary | ICD-10-CM | POA: Diagnosis not present

## 2018-05-16 DIAGNOSIS — Z79899 Other long term (current) drug therapy: Secondary | ICD-10-CM | POA: Diagnosis not present

## 2018-05-16 DIAGNOSIS — M0609 Rheumatoid arthritis without rheumatoid factor, multiple sites: Secondary | ICD-10-CM | POA: Diagnosis not present

## 2018-05-16 DIAGNOSIS — M170 Bilateral primary osteoarthritis of knee: Secondary | ICD-10-CM

## 2018-05-16 DIAGNOSIS — Z87442 Personal history of urinary calculi: Secondary | ICD-10-CM

## 2018-05-16 DIAGNOSIS — I10 Essential (primary) hypertension: Secondary | ICD-10-CM

## 2018-05-16 DIAGNOSIS — Z8639 Personal history of other endocrine, nutritional and metabolic disease: Secondary | ICD-10-CM

## 2018-05-16 DIAGNOSIS — M2450 Contracture, unspecified joint: Secondary | ICD-10-CM

## 2018-05-16 DIAGNOSIS — E119 Type 2 diabetes mellitus without complications: Secondary | ICD-10-CM

## 2018-05-16 DIAGNOSIS — Z8619 Personal history of other infectious and parasitic diseases: Secondary | ICD-10-CM

## 2018-05-16 NOTE — Progress Notes (Signed)
Pharmacy Note  Subjective:  Patient presents today to the [facility] to see Dr. Corliss Skains.  Patient seen by pharmacist for counseling on sulfasalazine for rheumatoid arthritis. She is currently on Arava and Plaquenil.     Objective: CMP     Component Value Date/Time   NA 140 04/18/2018 1208   NA 137 01/28/2018 0924   K 4.2 04/18/2018 1208   CL 109 04/18/2018 1208   CO2 24 04/18/2018 1208   GLUCOSE 125 (H) 04/18/2018 1208   BUN 13 04/18/2018 1208   BUN 14 01/28/2018 0924   CREATININE 0.50 (L) 04/18/2018 1208   CALCIUM 8.8 04/18/2018 1208   PROT 6.6 04/18/2018 1208   PROT 6.5 01/28/2018 0924   ALBUMIN 4.0 01/28/2018 0924   AST 12 04/18/2018 1208   ALT 7 04/18/2018 1208   ALKPHOS 81 01/28/2018 0924   BILITOT 0.4 04/18/2018 1208   BILITOT 0.5 01/28/2018 0924   GFRNONAA 95 04/18/2018 1208   GFRAA 110 04/18/2018 1208    CBC    Component Value Date/Time   WBC 6.3 04/18/2018 1208   RBC 3.76 (L) 04/18/2018 1208   HGB 10.7 (L) 04/18/2018 1208   HGB 10.4 (L) 01/28/2018 0924   HCT 33.8 (L) 04/18/2018 1208   HCT 31.2 (L) 01/28/2018 0924   PLT 215 04/18/2018 1208   PLT 208 01/28/2018 0924   MCV 89.9 04/18/2018 1208   MCV 86 01/28/2018 0924   MCH 28.5 04/18/2018 1208   MCHC 31.7 (L) 04/18/2018 1208   RDW 15.5 (H) 04/18/2018 1208   RDW 15.1 01/28/2018 0924   LYMPHSABS 1,317 04/18/2018 1208   LYMPHSABS 1.3 01/28/2018 0924   MONOABS 0.7 08/17/2017 1150   EOSABS 441 04/18/2018 1208   EOSABS 0.5 (H) 01/28/2018 0924   BASOSABS 69 04/18/2018 1208   BASOSABS 0.1 01/28/2018 0924     Baseline Immunosuppressant Labs TB gold: negative July 2017 Hepatitis panel:negative July 2017 YYT:[id number] July 2017 SPEP: normal July 2017 Immunoglobulins: normal July 2017  Chest x-ray: no active lung disease 08/17/17  Does the patient have an allergy to sulfa drugs? No  Assessment/Plan: Patient was counseled on the purpose, proper use, and adverse effects of sulfasalazine  including risk of infection and chance of nausea, headache, and sun sensitivity.  Also discussed risk of skin rash and advised patient to stop the medication and let us know if she develops a rash. Also discussed for the potential of discoloration of the urine, sweat, or tears.  Advised patient to avoid live vaccines.  Recommend annual influenza, Pneumovax 23, Prevnar 13, and Shingrix as indicated.   Reviewed the importance of frequent labs to monitor liver, kidneys, and blood counts.  Standing orders placed and patient to return 1 month after starting therapy and then every 3 months.  Provided patient with educational materials on sulfasalazine and answered all questions.  Patient consented to sulfasalazine use, and consent will be uploaded into the media tab.    Patient dose will be 500 mg 2 tablets twice daily. Patient will call when she is ready to start and will send in prescription then.  All questions encouraged and answered.  Instructed patient to call with any further questions or concerns.  Verlin Fester, PharmD, BCACP Rheumatology Clinical Pharmacist  05/16/2018 12:02 PM

## 2018-05-16 NOTE — Patient Instructions (Signed)
Sulfasalazine tablets What is this medicine? SULFASALAZINE (sul fa SAL a zeen) is used to treat ulcerative colitis. This medicine may be used for other purposes; ask your health care provider or pharmacist if you have questions. COMMON BRAND NAME(S): Azulfidine, Sulfazine What should I tell my health care provider before I take this medicine? They need to know if you have any of these conditions: -asthma -blood disorders or anemia -glucose-6-phosphate dehydrogenase (G6PD) deficiency -intestinal obstruction -kidney disease -liver disease -porphyria -urinary tract obstruction -an unusual reaction to sulfasalazine, sulfa drugs, salicylates, or other medicines, foods, dyes, or preservatives -pregnant or trying to get pregnant -breast-feeding How should I use this medicine? Take this medicine by mouth with a full glass of water. Follow the directions on the prescription label. If the medicine upsets your stomach, take it with food or milk. Take your medicine at regular intervals. Do not take your medicine more often than directed. Do not stop taking except on your doctor's advice. Talk to your pediatrician regarding the use of this medicine in children. While this drug may be prescribed for children as young as 6 years for selected conditions, precautions do apply. Patients over 65 years old may have a stronger reaction and need a smaller dose. Overdosage: If you think you have taken too much of this medicine contact a poison control center or emergency room at once. NOTE: This medicine is only for you. Do not share this medicine with others. What if I miss a dose? If you miss a dose, take it as soon as you can. If it is almost time for your next dose, take only that dose. Do not take double or extra doses. What may interact with this medicine? -digoxin -folic acid This list may not describe all possible interactions. Give your health care provider a list of all the medicines, herbs,  non-prescription drugs, or dietary supplements you use. Also tell them if you smoke, drink alcohol, or use illegal drugs. Some items may interact with your medicine. What should I watch for while using this medicine? Visit your doctor or health care professional for regular checks on your progress. You will need frequent blood and urine checks. This medicine can make you more sensitive to the sun. Keep out of the sun. If you cannot avoid being in the sun, wear protective clothing and use sunscreen. Do not use sun lamps or tanning beds/booths. Drink plenty of water while taking this medicine. What side effects may I notice from receiving this medicine? Side effects that you should report to your doctor or health care professional as soon as possible: -allergic reactions like skin rash, itching or hives, swelling of the face, lips, or tongue -fever, chills, or any other sign of infection -painful, difficult, or reduced urination -redness, blistering, peeling or loosening of the skin, including inside the mouth -severe stomach pain -unusual bleeding or bruising -unusually weak or tired -yellowing of the skin or eyes Side effects that usually do not require medical attention (report to your doctor or health care professional if they continue or are bothersome): -headache -loss of appetite -nausea, vomiting -orange color to the urine -reduced sperm count This list may not describe all possible side effects. Call your doctor for medical advice about side effects. You may report side effects to FDA at 1-800-FDA-1088. Where should I keep my medicine? Keep out of the reach of children. Store at room temperature between 15 and 30 degrees C (59 and 86 degrees F). Keep container tightly closed. Throw away   any unused medicine after the expiration date. NOTE: This sheet is a summary. It may not cover all possible information. If you have questions about this medicine, talk to your doctor, pharmacist, or  health care provider.  2019 Elsevier/Gold Standard (2007-10-19 11:38:15)  

## 2018-05-18 ENCOUNTER — Ambulatory Visit: Payer: Medicare HMO | Admitting: Physician Assistant

## 2018-05-23 ENCOUNTER — Telehealth: Payer: Self-pay | Admitting: Rheumatology

## 2018-05-23 NOTE — Telephone Encounter (Signed)
Patient called stating she was given a prescription for Forteo and was told by the pharmacist at [facility] that the cost is $900.  Patient is requesting a return call to let her know if there is something else she could take.

## 2018-05-24 ENCOUNTER — Telehealth: Payer: Self-pay | Admitting: Rheumatology

## 2018-05-24 MED ORDER — ALENDRONATE SODIUM 70 MG PO TABS: 70.0000 mg | ORAL_TABLET | ORAL | 0 refills | Status: DC

## 2018-05-24 NOTE — Telephone Encounter (Signed)
Patient called stating she left a message yesterday and is checking if Dr. Corliss Skains was able to call her in a different prescription due to her Forteo being too expensive.

## 2018-05-24 NOTE — Telephone Encounter (Signed)
See previous phone note.  

## 2018-05-24 NOTE — Telephone Encounter (Signed)
Patient advised per Dr. Corliss Skains her other option would be Reclast. Patient advised this is an infusion at the hospital. Patient states with things the way they are she does not prefer to go anywhere at this time. Per Dr. Corliss Skains patient to continue Fosamax at this time.  Patient advised and prescription sent to the pharmacy.

## 2018-06-06 ENCOUNTER — Telehealth: Payer: Self-pay | Admitting: Rheumatology

## 2018-06-06 NOTE — Telephone Encounter (Signed)
Patient called stating she spoke with someone earlier today requesting medication doctor mentioned to her at last visit be sent into her pharmacy for her. Patient states that was two hours ago, and she was told it would be sent in for her, but pharmacy does not have rx. (Sulfasalazine) Patient was calling back to see when rx will be sent in. Please call to advise.

## 2018-06-06 NOTE — Telephone Encounter (Signed)
Patient called checking on the status of "her RA medication and if it has been sent to [facility]."

## 2018-06-06 NOTE — Telephone Encounter (Signed)
Will clarify SSZ dose tomorrow with Dr. Corliss Skains and then send in rx. Patient is aware the prescription will be sent tomorrow.

## 2018-06-06 NOTE — Telephone Encounter (Signed)
Patient advised her prescription was sent to the pharmacy 05/11/18 for a 90 day supply. Patient will contact the pharmacy and call back if she has trouble getting it filled.

## 2018-06-07 MED ORDER — SULFASALAZINE 500 MG PO TABS: 500.0000 mg | ORAL_TABLET | Freq: Two times a day (BID) | ORAL | 0 refills | Status: DC

## 2018-06-07 NOTE — Telephone Encounter (Signed)
Advised patient of SSZ dosage and advised patient to take in combination with Arava. Patient verbalized understanding. Prescription has been sent to the pharmacy.

## 2018-06-07 NOTE — Telephone Encounter (Signed)
Is patient to stay on Arava 20mg  po qd and add SSZ 500mg  2 tablets twice daily?

## 2018-06-07 NOTE — Addendum Note (Signed)
Addended by: Ellen Henri on: 06/07/2018 01:36 PM   Modules accepted: Orders

## 2018-06-07 NOTE — Telephone Encounter (Signed)
Her dose of sulfasalazine will be 500 mg 1 tablet p.o. twice daily in combination with Arava.  She should get labs in 1 month and then every 3 months.

## 2018-06-13 IMAGING — CT CT ANGIO CHEST
2 of 7 series · 18 of 46 positions shown · IV contrast (APPLIED)
Comparison: Single-view of the chest this same day.

CLINICAL DATA: Recent onset of shortness of breath and exertional
hypoxia.

EXAM:
CT ANGIOGRAPHY CHEST WITH CONTRAST
TECHNIQUE: Multidetector CT imaging of the chest was performed using the
standard protocol during bolus administration of intravenous
contrast. Multiplanar CT image reconstructions and MIPs were
obtained to evaluate the vascular anatomy.
CONTRAST:  55 cc Isovue 370.

[Series 7: thins · axial · 0.65mm/px · z∈[+1103,+1323]mm · 15 of 354 slices shown]
[im 20/354  lung]
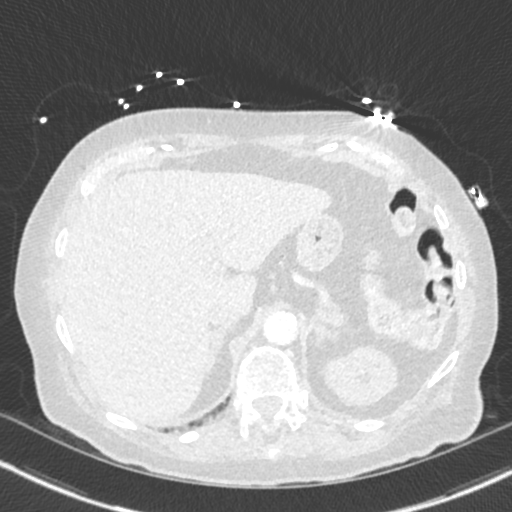
[im 40/354  soft-tissue]
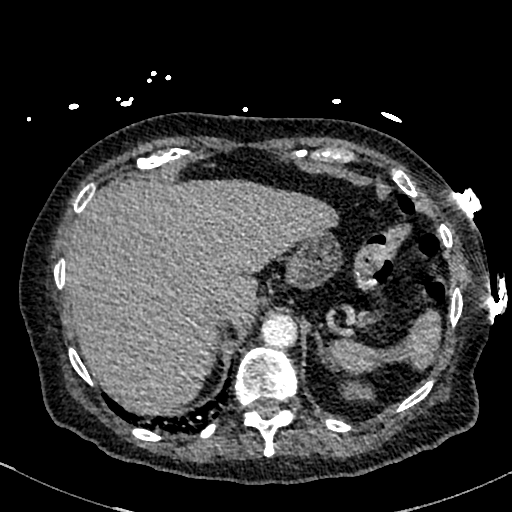
[im 59/354  lung]
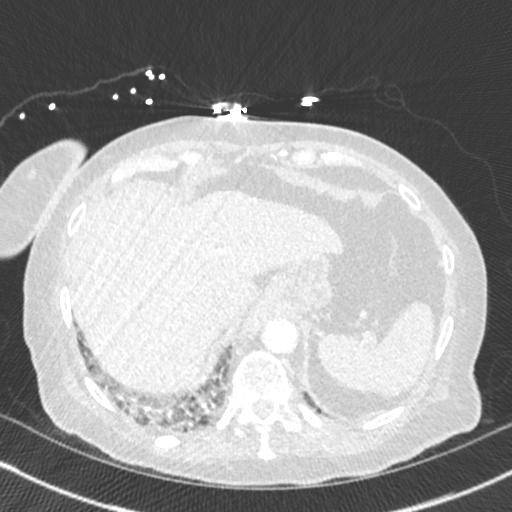
[im 79/354  soft-tissue]
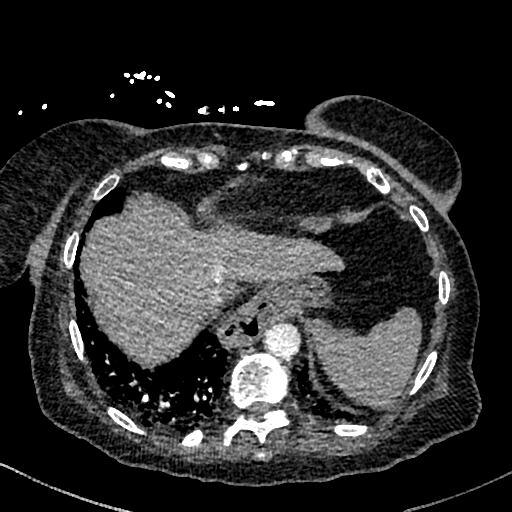
[im 118/354  lung]
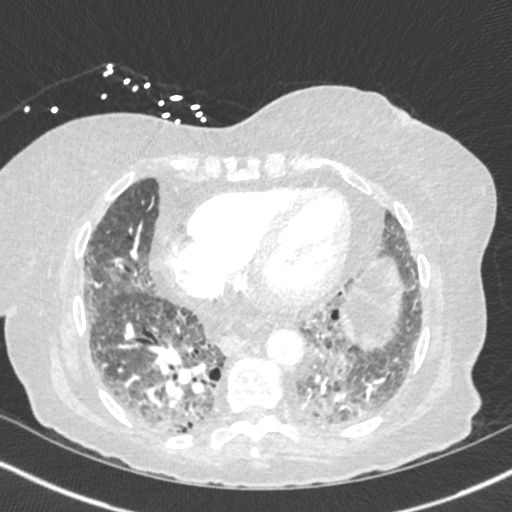
[im 138/354  soft-tissue]
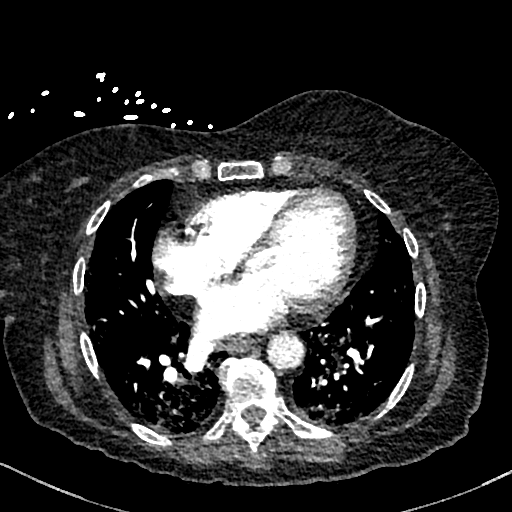
[im 157/354  lung]
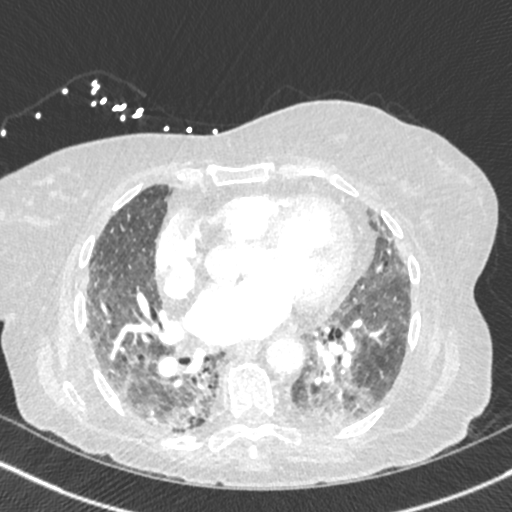
[im 177/354  soft-tissue]
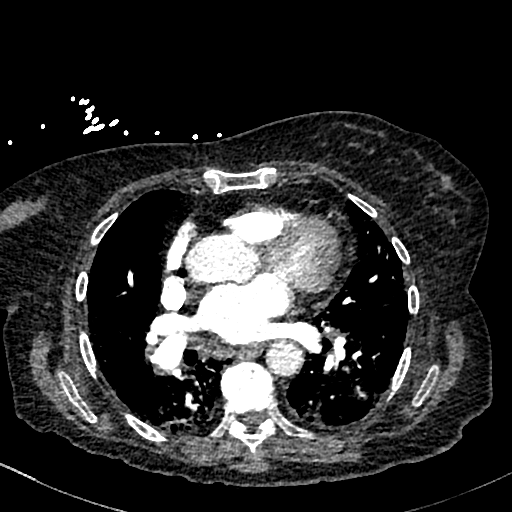
[im 197/354  lung]
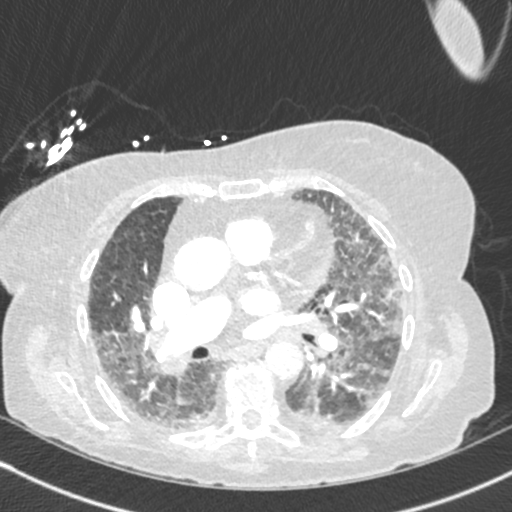
[im 216/354  soft-tissue]
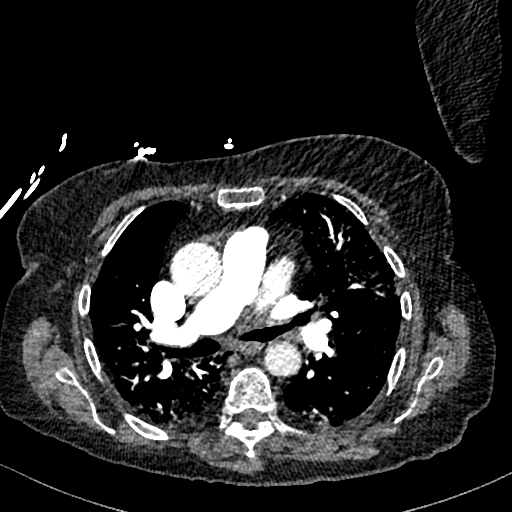
[im 236/354  lung]
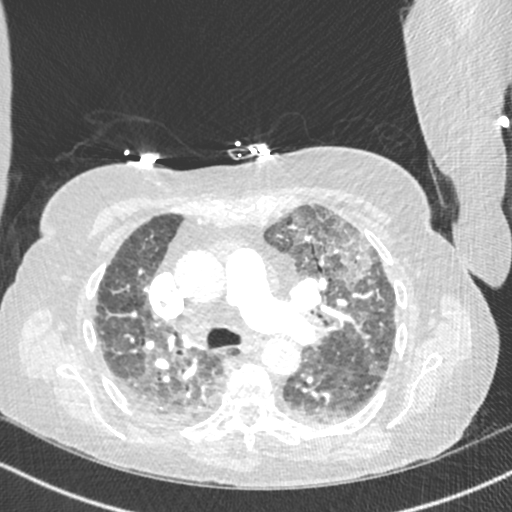
[im 275/354  soft-tissue]
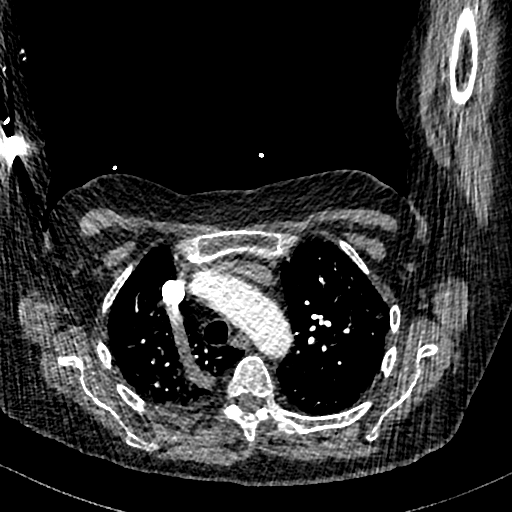
[im 295/354  lung]
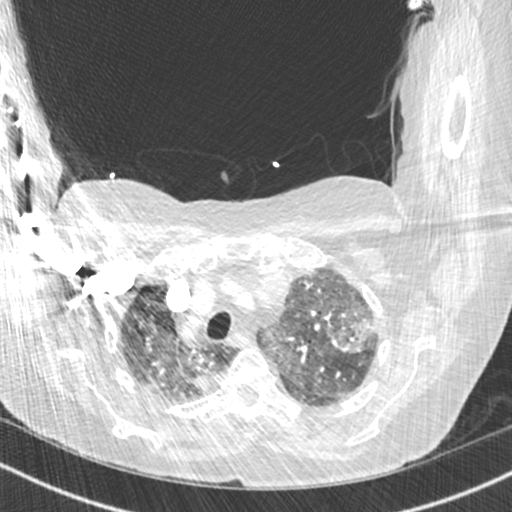
[im 314/354  soft-tissue]
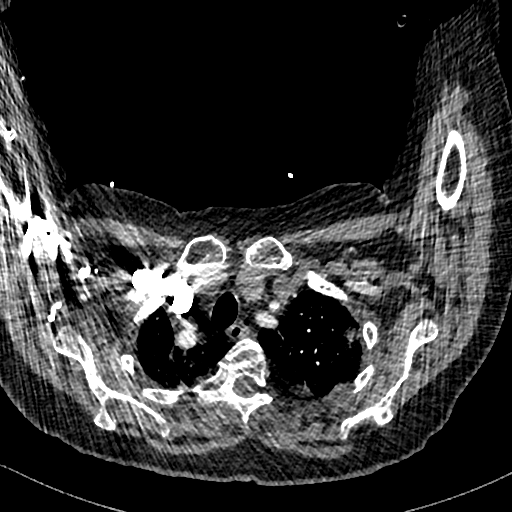
[im 334/354  lung]
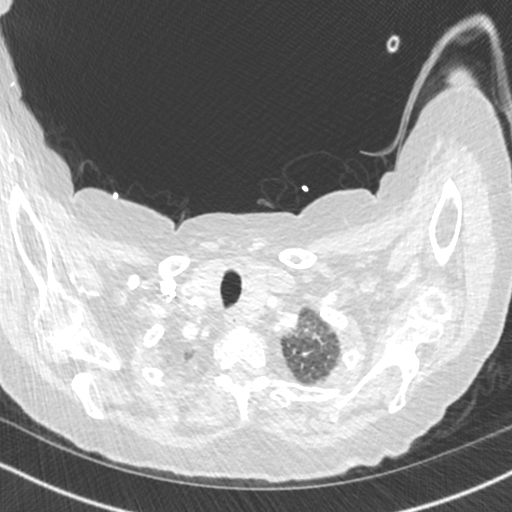

[Series 8: cor · coronal · 0.50mm/px · 3 of 143 slices shown]
[im 36/143  soft-tissue]
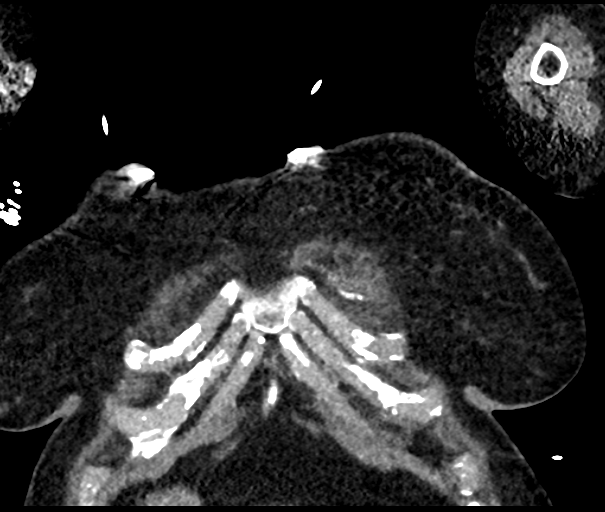
[im 72/143  soft-tissue]
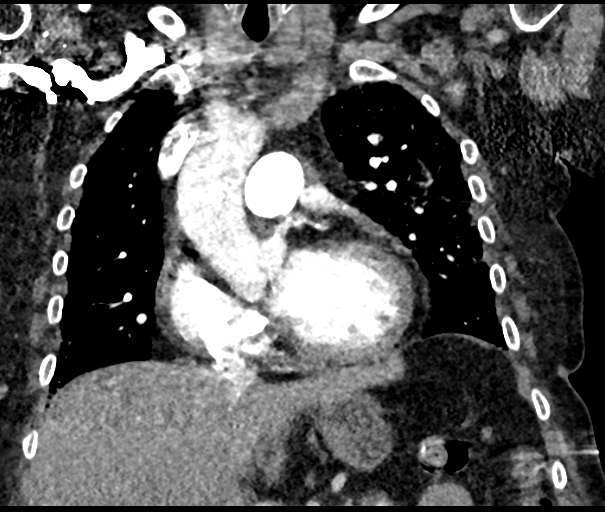
[im 107/143  soft-tissue]
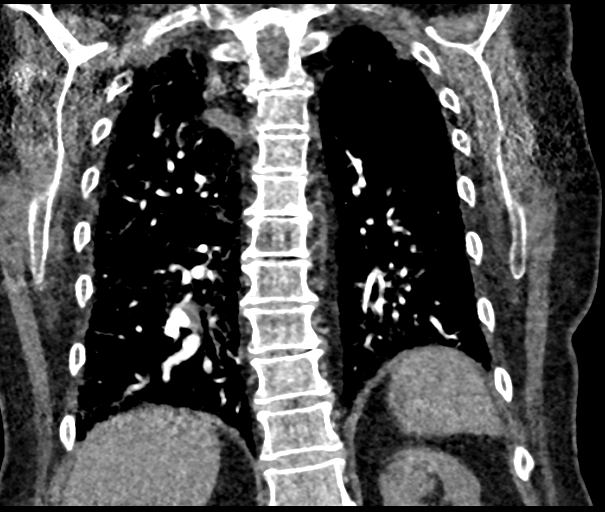

[18 of 46 positions shown; findings below may reference images not displayed]

FINDINGS: Cardiovascular: No pulmonary embolus is identified. Calcific aortic
and coronary atherosclerosis is seen. There is cardiomegaly. No
pericardial effusion.

Mediastinum/Nodes: The patient has a small to moderate hiatal
hernia. Thyroid gland appears normal. Right hilar lymph node on
image 53 measures 2 cm short axis dimension. Left hilar lymph node
on image 53 measures 1.1 cm short axis dimension. Subcarinal lymph
node on image 61 measures 1.3 cm short axis dimension.

Lungs/Pleura: No pleural effusion. Extensive bilateral ground-glass
opacities are seen. No nodule or mass.

Upper Abdomen: Negative.

Musculoskeletal: No lytic or sclerotic lesion.

Review of the MIP images confirms the above findings.
IMPRESSION: Negative for pulmonary embolus.

Extensive bilateral ground-glass opacities are nonspecific and could
be due to pulmonary edema in this patient with cardiomegaly, ARDS or
hypersensitivity pneumonitis among other entities.

Bilateral hilar and mediastinal lymph nodes as described above are
likely reactive.

Calcific aortic and coronary atherosclerosis.

Hiatal hernia.

## 2018-06-27 ENCOUNTER — Encounter: Payer: Self-pay | Admitting: Rheumatology

## 2018-06-27 ENCOUNTER — Other Ambulatory Visit: Payer: Self-pay

## 2018-06-27 ENCOUNTER — Telehealth: Payer: Self-pay | Admitting: Rheumatology

## 2018-06-27 ENCOUNTER — Telehealth (INDEPENDENT_AMBULATORY_CARE_PROVIDER_SITE_OTHER): Payer: Medicare HMO | Admitting: Rheumatology

## 2018-06-27 DIAGNOSIS — Z8639 Personal history of other endocrine, nutritional and metabolic disease: Secondary | ICD-10-CM

## 2018-06-27 DIAGNOSIS — Z8619 Personal history of other infectious and parasitic diseases: Secondary | ICD-10-CM

## 2018-06-27 DIAGNOSIS — M0609 Rheumatoid arthritis without rheumatoid factor, multiple sites: Secondary | ICD-10-CM | POA: Diagnosis not present

## 2018-06-27 DIAGNOSIS — M810 Age-related osteoporosis without current pathological fracture: Secondary | ICD-10-CM | POA: Diagnosis not present

## 2018-06-27 DIAGNOSIS — Z79899 Other long term (current) drug therapy: Secondary | ICD-10-CM | POA: Diagnosis not present

## 2018-06-27 DIAGNOSIS — Z87442 Personal history of urinary calculi: Secondary | ICD-10-CM

## 2018-06-27 DIAGNOSIS — M1A09X0 Idiopathic chronic gout, multiple sites, without tophus (tophi): Secondary | ICD-10-CM

## 2018-06-27 DIAGNOSIS — M170 Bilateral primary osteoarthritis of knee: Secondary | ICD-10-CM

## 2018-06-27 DIAGNOSIS — I10 Essential (primary) hypertension: Secondary | ICD-10-CM

## 2018-06-27 DIAGNOSIS — M2450 Contracture, unspecified joint: Secondary | ICD-10-CM

## 2018-06-27 NOTE — Progress Notes (Signed)
Virtual Visit via Telephone Note  I connected with Mary [name] on 06/27/18 at 11:00 AM EDT by telephone and verified that I am speaking with the correct person using two identifiers.   I discussed the limitations, risks, security and privacy concerns of performing an evaluation and management service by telephone and the availability of in person appointments. I also discussed with the patient that there may be a patient responsible charge related to this service. The patient expressed understanding and agreed to proceed. This service was conducted via virtual visit.  Patient was unable to use Webex. The patient was located at home. I was located in my office.  Consent was obtained prior to the virtual visit and is aware of possible charges through their insurance for this visit.  The patient is an established patient.  Dr. Corliss Skainseveshwar, MD conducted the virtual visit and Sherron Alesaylor Brynlie Daza, PA-C acted as scribe during the service.  Office staff helped with scheduling follow up visits after the service was conducted.    CC: Discuss medication options   History of Present Illness: Patient is a [age] old female with a past medical history of seronegative rheumatoid arthritis, gout, osteoporosis, and osteoarthritis.  She is taking Arava 20 mg po daily.  At her last visit on 05/06/18 she was consented to start on Sulfasalazine but discontinued due to not tolerating the GI SE-diarrhea.  She previously was taking PLQ but discontinued in early March 2020 per recommendations of ophthalmology.  She denies any recent rheumatoid arthritis flares.  She has no joint swelling.  She takes tylenol at bedtime which provides sufficient pain relief. She is in a wheelchair.  She denies any recent gout flares.  She is taking Fosamax 70 mg po once weekly.  She discontinued Forteo due to cost.    Review of Systems  Constitutional: Negative for fever and malaise/fatigue.  Eyes: Negative for photophobia, pain, discharge and redness.   Respiratory: Negative for cough, shortness of breath and wheezing.   Cardiovascular: Negative for chest pain and palpitations.  Gastrointestinal: Negative for blood in stool, constipation and diarrhea.  Genitourinary: Negative for dysuria.  Musculoskeletal: Negative for back pain, joint pain, myalgias and neck pain.  Skin: Negative for rash.  Neurological: Negative for dizziness and headaches.  Psychiatric/Behavioral: Negative for depression. The patient is not nervous/anxious and does not have insomnia.      Observations/Objective: Physical Exam  Constitutional: She is oriented to person, place, and time.  Neurological: She is alert and oriented to person, place, and time.  Psychiatric: Mood, memory, affect and judgment normal.   Patient reports morning stiffness for0  minutes.   Patient reports nocturnal pain.  Difficulty dressing/grooming: Denies Difficulty climbing stairs: Reports Difficulty getting out of chair: Reports Difficulty using hands for taps, buttons, cutlery, and/or writing: Denies  Assessment and Plan: Rheumatoid arthritis of multiple sites with negative rheumatoid factor (HCC): She has not had any recent flares.  She has occasional discomfort at night, and she takes Tylenol at bedtime PRN, which provides adequate pain relief.  She is clinically doing well on Arava 20 mg po daily.  She has no missed any doses.  She discontinued PLQ during the first week of March 2020 per recommendations of ophthalmology.  She was started on SSZ in March but discontinued due to not tolerating the GI side effects.  She has not noticed increased joint pain or joint swelling since discontinuing SSZ.  She will continue on monotherapy of Arava 20 mg po daily.  She was advised  to notify us if she develops increased joint pain or joint swelling.  She will follow up in 3 months.    High risk medication use -Discontinued SSZ due to SE of diarrhea.  She continues to take Arava 20 mg po daily. She  previously discontinued PLQ 200 mg BID M-F per ophthalmology recommendation.  Labs were drawn on 05/03/18.  Standing orders are in place.  If she develops an infection she is aware that she should hold Arava until the infection has cleared.  We discussed the standard precautions recommended by the CDC.   Idiopathic chronic gout of multiple sites without tophus: She has not had any recent gout flares. She takes allopurinol 200 mg po daily.   Age-related osteoporosis without current pathological fracture - She was started on Forteo on 04/20/18 but discontinued due to cost.  She is taking Fosamax 70 mg po once weekly. She has been tolerating it well without any side effects.  DEXA 10/17:L spine shows a T score of -3.6. DEXA on 04/11/2018 T-score: -3.4, BMD: 0.476, -11% change for RFN.   Primary osteoarthritis of both knees: She has no discomfort at this time.  She uses a wheelchair and a chair lift to help with mobility.    Flexion contractures: Chronic but stable.   Follow Up Instructions: She will follow up in 3 months.   I discussed the assessment and treatment plan with the patient. The patient was provided an opportunity to ask questions and all were answered. The patient agreed with the plan and demonstrated an understanding of the instructions.   The patient was advised to call back or seek an in-person evaluation if the symptoms worsen or if the condition fails to improve as anticipated.  I provided 25 minutes of non-face-to-face time during this encounter.  Pollyann Savoy, MD   Scribed by-  Gearldine Bienenstock, PA-C

## 2018-06-27 NOTE — Telephone Encounter (Signed)
Patient is scheduled for a virtual visit today with Dr. Deveshwar.  

## 2018-06-27 NOTE — Telephone Encounter (Signed)
Patient called stating she has discontinued taking the Sulfasalazine due to having "a very bad case of diarrhea."  Patient states it lasted approximately 3 days and states she had no other symptoms so thinks it is due to the medication.  Patient requested a return call to discuss other medication options.

## 2018-07-12 ENCOUNTER — Telehealth: Payer: Self-pay | Admitting: Rheumatology

## 2018-07-12 MED ORDER — LEFLUNOMIDE 20 MG PO TABS: 20.0000 mg | ORAL_TABLET | Freq: Every day | ORAL | 0 refills | Status: DC

## 2018-07-12 NOTE — Telephone Encounter (Signed)
Last Visit: 05/16/18 Next visit: 09/20/18 Labs: 05/03/18 RBC 3.85, Hgb 11.4, Hct 35.2 Glucose, Hct 35.2 Glucose 124, CO2 21, MCHC 32.2 RDW 19.3 MPV, 11.6  Okay to refill per Dr. Corliss Skains

## 2018-07-12 NOTE — Telephone Encounter (Signed)
Patient called requesting prescription refill of Leflunomide sent to [facility] in [place].    Patient also requested her labwork orders be sent to [facility] in [place].  Patient requested a return call to let her know when they are sent.

## 2018-07-20 ENCOUNTER — Telehealth: Payer: Self-pay | Admitting: Rheumatology

## 2018-07-20 ENCOUNTER — Other Ambulatory Visit: Payer: Self-pay | Admitting: *Deleted

## 2018-07-20 DIAGNOSIS — Z79899 Other long term (current) drug therapy: Secondary | ICD-10-CM

## 2018-07-20 NOTE — Telephone Encounter (Signed)
Lab Orders released. Attempted to contact patient multiple times and line is busy.

## 2018-07-20 NOTE — Telephone Encounter (Signed)
Patient advised her lab orders have been released and she can call to make her appointment o have lab performed,

## 2018-07-20 NOTE — Telephone Encounter (Signed)
Patient called stating she called last week requesting her labwork orders be sent to [facility] in [place].  Patient states she has not received a returned call to let her know if the orders were sent.  Patient states she plans to make an appointment next week, but needs the orders sent first and is requesting a return call.

## 2018-07-27 LAB — CBC WITH DIFFERENTIAL/PLATELET
Basophils Absolute: 0.1 10*3/uL (ref 0.0–0.2)
Basos: 1 %
EOS (ABSOLUTE): 0.6 10*3/uL — ABNORMAL HIGH (ref 0.0–0.4)
Eos: 5 %
Hematocrit: 34.8 % (ref 34.0–46.6)
Hemoglobin: 11.2 g/dL (ref 11.1–15.9)
Immature Grans (Abs): 0 10*3/uL (ref 0.0–0.1)
Immature Granulocytes: 0 %
Lymphocytes Absolute: 1.5 10*3/uL (ref 0.7–3.1)
Lymphs: 13 %
MCH: 29.6 pg (ref 26.6–33.0)
MCHC: 32.2 g/dL (ref 31.5–35.7)
MCV: 92 fL (ref 79–97)
Monocytes Absolute: 0.8 10*3/uL (ref 0.1–0.9)
Monocytes: 8 %
Neutrophils Absolute: 8 10*3/uL — ABNORMAL HIGH (ref 1.4–7.0)
Neutrophils: 73 %
Platelets: 216 10*3/uL (ref 150–450)
RBC: 3.78 x10E6/uL (ref 3.77–5.28)
RDW: 15 % (ref 11.7–15.4)
WBC: 11.1 10*3/uL — ABNORMAL HIGH (ref 3.4–10.8)

## 2018-07-27 LAB — CMP14+EGFR
ALT: 8 IU/L (ref 0–32)
AST: 11 IU/L (ref 0–40)
Albumin/Globulin Ratio: 1.5 (ref 1.2–2.2)
Albumin: 4.2 g/dL (ref 3.7–4.7)
Alkaline Phosphatase: 90 IU/L (ref 39–117)
BUN/Creatinine Ratio: 24 (ref 12–28)
BUN: 16 mg/dL (ref 8–27)
Bilirubin Total: 0.3 mg/dL (ref 0.0–1.2)
CO2: 17 mmol/L — ABNORMAL LOW (ref 20–29)
Calcium: 8.9 mg/dL (ref 8.7–10.3)
Chloride: 101 mmol/L (ref 96–106)
Creatinine, Ser: 0.66 mg/dL (ref 0.57–1.00)
GFR calc Af Amer: 99 mL/min/{1.73_m2} (ref >59)
GFR calc non Af Amer: 86 mL/min/{1.73_m2} (ref >59)
Globulin, Total: 2.8 g/dL (ref 1.5–4.5)
Glucose: 248 mg/dL — ABNORMAL HIGH (ref 65–99)
Potassium: 4.3 mmol/L (ref 3.5–5.2)
Sodium: 139 mmol/L (ref 134–144)
Total Protein: 7 g/dL (ref 6.0–8.5)

## 2018-07-27 NOTE — Progress Notes (Signed)
Elevated glucose.  Please notify patient and fax the lab results to her PCP.

## 2018-08-15 ENCOUNTER — Telehealth: Payer: Self-pay | Admitting: Rheumatology

## 2018-08-15 MED ORDER — LEFLUNOMIDE 20 MG PO TABS: 20.0000 mg | ORAL_TABLET | Freq: Every day | ORAL | 0 refills | Status: DC

## 2018-08-15 NOTE — Telephone Encounter (Signed)
Patient request a refill on Leflunomide sent to [facility]. Patient would like another medication called in for arthritis in hip/ back as well.  Pain has gotten worse over the weekend. Please call to advise.

## 2018-08-15 NOTE — Telephone Encounter (Signed)
Patient complains of right hip and back pain, onset of pain Saturday. Patient has taken tylenol for pain relief. Patient states the tylenol helps only temporarily. Patient has RA and states she has been taking arava as prescribed. Please advise.

## 2018-08-15 NOTE — Telephone Encounter (Signed)
Last Visit: 06/27/2018 telemedicine  Next Visit: 09/20/2018 Labs: 07/26/2018 Elevated glucose.  Okay to refill per Dr. Estanislado Pandy.    Advised patient to follow up with PCP regarding back pain, patient verbalized understanding.

## 2018-08-15 NOTE — Telephone Encounter (Signed)
The back pain is not related to rheumatoid arthritis.  Please advise patient to see her PCP.

## 2018-09-05 ENCOUNTER — Other Ambulatory Visit: Payer: Self-pay | Admitting: Rheumatology

## 2018-09-05 NOTE — Telephone Encounter (Signed)
Last Visit: 05/16/18 Next visit: 09/20/18 Labs: 07/26/18 Elevated glucose  Okay to refill per Dr. Estanislado Pandy

## 2018-09-07 NOTE — Progress Notes (Signed)
Office Visit Note  Patient: Mary [name]             Date of Birth: Jan 31, 1941           MRN: [id number]             PCP: Dema SeverinYork, Regina F, NP Referring: Dema SeverinYork, Regina F, NP Visit Date: 09/20/2018 Occupation: @GUAROCC @  Subjective:  Right knee joint pain      History of Present Illness: Mary MeierGaynelle [name] is a 78 y.o. female with history of seronegative rheumatoid arthritis, gout, osteoporosis, and osteoarthritis.  Patient is on Arava 20 mg 1 tablet by mouth daily.  Mary [name] denies any recent rheumatoid arthritis flares.  Mary [name] continues to have chronic pain in bilateral wrist joints and bilateral knee joints.  Mary [name] states that her right knee will occasionally swell.  Mary [name] states that Mary [name] does have intermittent lower back pain.  Mary [name] denies any increased stiffness or swelling at this time.  Mary [name] is taking Fosamax 70 mg 1 tablet by mouth once weekly with a vitamin D supplement for management of osteoporosis.  Mary [name] takes allopurinol 100 mg po daily for management of gout. Mary [name] denies any recent gout flares.  Mary [name] is primarily wheelchair-bound at this point.  Activities of Daily Living:  Patient reports morning stiffness for several minutes.   Patient Reports nocturnal pain.  Difficulty dressing/grooming: Reports Difficulty climbing stairs: Reports Difficulty getting out of chair: Reports Difficulty using hands for taps, buttons, cutlery, and/or writing: Reports  Review of Systems  Constitutional: Negative for fatigue.  HENT: Negative for mouth sores, mouth dryness and nose dryness.   Eyes: Negative for pain, itching, visual disturbance and dryness.  Respiratory: Negative for cough, hemoptysis, shortness of breath, wheezing and difficulty breathing.   Cardiovascular: Negative for chest pain, palpitations, hypertension and swelling in legs/feet.  Gastrointestinal: Negative for blood in stool, constipation and diarrhea.  Endocrine: Negative for increased urination.  Genitourinary: Negative for painful  urination and pelvic pain.  Musculoskeletal: Positive for arthralgias, joint pain, joint swelling and morning stiffness. Negative for myalgias, muscle weakness, muscle tenderness and myalgias.  Skin: Negative for color change, pallor, rash, hair loss, nodules/bumps, redness, skin tightness, ulcers and sensitivity to sunlight.  Allergic/Immunologic: Negative for susceptible to infections.  Neurological: Negative for dizziness, numbness, headaches and weakness.  Hematological: Negative for bruising/bleeding tendency and swollen glands.  Psychiatric/Behavioral: Negative for depressed mood, confusion and sleep disturbance. The patient is not nervous/anxious.     PMFS History:  Patient Active Problem List   Diagnosis Date Noted  . Mild persistent asthma without complication 11/18/2017  . Age-related osteoporosis without current pathological fracture 11/19/2016  . Acute respiratory failure (HCC)   . Rheumatoid lung (HCC)   . Hypoxia 09/03/2016  . Essential hypertension 09/03/2016  . Urinary tract infection 09/03/2016  . 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 High risk medication use 02/28/2016  . Idiopathic chronic gout of multiple sites without tophus 02/28/2016  . Primary osteoarthritis of both knees 02/28/2016  . Flexion contractures 02/28/2016  . History of hypothyroidism 02/28/2016  . History of diabetes mellitus 02/28/2016  . History of shingles 02/28/2016  . History of hyperlipidemia 02/28/2016    Past Medical History:  Diagnosis Date  . Complication of anesthesia   . Diabetes mellitus without complication (HCC)   . Dyspnea   . Gout   . High cholesterol   . History of kidney stones   . Hypertension   . Hypoxia 08/2016  .  Osteoarthritis   . PONV (postoperative nausea and vomiting)   . Rheumatoid arthritis (HCC)     Family History  Problem Relation Age of Onset  . Arthritis Brother   . Arthritis Sister   . Arthritis  Sister   . Arthritis Brother   . Arthritis Brother   . Arthritis Brother    Past Surgical History:  Procedure Laterality Date  . ABDOMINAL HYSTERECTOMY     Social History   Social History Narrative  . Not on file   Immunization History  Administered Date(s) Administered  . Influenza, High Dose Seasonal PF 11/26/2015  . Influenza-Unspecified 12/07/2016  . Pneumococcal-Unspecified 09/04/2015     Objective: Vital Signs: BP (!) 144/79 (BP Location: Left Arm, Patient Position: Sitting, Cuff Size: Normal)   Pulse 90   Resp 15   Ht 5' (1.524 m) Comment: patient in wheelchair  Wt 154 lb (69.9 kg) Comment: per patient  BMI 30.08 kg/m    Physical Exam Vitals signs and nursing note reviewed.  Constitutional:      Appearance: Mary [name] is well-developed.  HENT:     Head: Normocephalic and atraumatic.  Eyes:     Conjunctiva/sclera: Conjunctivae normal.  Neck:     Musculoskeletal: Normal range of motion.  Cardiovascular:     Rate and Rhythm: Normal rate and regular rhythm.     Heart sounds: Normal heart sounds.  Pulmonary:     Effort: Pulmonary effort is normal.     Breath sounds: Normal breath sounds.  Abdominal:     General: Bowel sounds are normal.     Palpations: Abdomen is soft.  Lymphadenopathy:     Cervical: No cervical adenopathy.  Skin:    General: Skin is warm and dry.     Capillary Refill: Capillary refill takes less than 2 seconds.  Neurological:     Mental Status: Mary [name] is alert and oriented to person, place, and time.  Psychiatric:        Behavior: Behavior normal.      Musculoskeletal Exam: Patient was seated in a wheelchair during the examination.  C-spine limited range of motion with lateral rotation.  Thoracic kyphosis noted.  Shoulder in abduction to about 90 degrees bilaterally.  Elbow joint contractures bilaterally.  Mary [name] has extremely limited range of motion of bilateral wrist joints.  Incomplete fist formation bilaterally.  Right hand and wrist edema  noted.  Mary [name] has tenderness on the dorsal aspect of the left wrist.  Subluxation of MCP joints noted.  Mary [name] has contracture PIP joints.  No tenderness or synovitis of MCP or PIP joints at this time.  Hip joint range of motion was difficult to assess while in the wheelchair.  Mary [name] has limited extension of the right knee.  Mary [name] has warmth of bilateral knee joints, worse in the right knee. Mary [name] has bilateral knee crepitus.  CDAI Exam: CDAI Score: 7.4  Patient Global: 7 mm; Provider Global: 7 mm Swollen: 2 ; Tender: 4  Joint Exam      Right  Left  Wrist   Tender   Tender  Knee  Swollen Tender  Swollen Tender     Investigation: No additional findings.  Imaging: No results found.  Recent Labs: Lab Results  Component Value Date   WBC 11.1 (H) 07/26/2018   HGB 11.2 07/26/2018   PLT 216 07/26/2018   NA 139 07/26/2018   K 4.3 07/26/2018   CL 101 07/26/2018   CO2 17 (L) 07/26/2018   GLUCOSE 248 (H) 07/26/2018  BUN 16 07/26/2018   CREATININE 0.66 07/26/2018   BILITOT 0.3 07/26/2018   ALKPHOS 90 07/26/2018   AST 11 07/26/2018   ALT 8 07/26/2018   PROT 7.0 07/26/2018   ALBUMIN 4.2 07/26/2018   CALCIUM 8.9 07/26/2018   GFRAA 99 07/26/2018    Speciality Comments: Prior therapy: Plaquenil (d/c per opthamologist recommendation) and SSZ (diarrhea)  Procedures:  No procedures performed Allergies: Aspirin and Codeine     Assessment / Plan:     Visit Diagnoses: Rheumatoid 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 Mary [name] experiences intermittent pain in right right knee joint.  Mary [name] is primarily wheelchair bound.  Mary [name] has tenderness on the dorsal aspect of the left wrist but no synovitis was noted.  Mary [name] has subluxation of all MCP joints and contractures of PIP joints.  Mary [name] has bilateral elbow joint contractures.  Mary [name] is bilateral shoulder abduction to about 90 degrees.  Mary [name] continues to take Arava 20 mg 1 tablet by  mouth daily.  Mary [name] previously tried taking sulfasalazine but discontinued due to experiencing diarrhea.  Mary [name] cannot take Plaquenil per recommendations of ophthalmology.  Mary [name] will continue on Arava 20 mg 1 tablet by mouth daily monotherapy.  Mary [name] will continue follow-up every 5 months.  High risk medication use -Arava 20 mg 1 tablet daily.  Most recent CBC/CMP within normal limits except for elevated WBC count on 07/26/2018.  Due for CBC/CMP in August and will monitor every 3 months.  Standing orders are in place. Mary [name] previously Discontinued SSZ due to SE of diarrhea.   Mary [name] previously discontinued PLQ 200 mg BID M-F per ophthalmology recommendation.   Idiopathic chronic gout of multiple sites without tophus - Mary [name] has not had any recent gout flares.  Mary [name] is taking allopurinol 100 mg po daily.  Uric acid was 5.0 on 01/28/18.   Age-related osteoporosis without current pathological fracture - Mary [name] is taking Fosamax 70 mg po once weekly along with a vitamin D supplement.Most recent DEXA on 04/11/2018 showed T-score of -3.4 at spine and negative 11% change in BMD at right hip compared to previous DEXA in October 2017.  Primary osteoarthritis of both knees -Mary [name] has intermittent pain in bilateral knee joints.  Mary [name] has warmth of bilateral knees on exam.  Bilateral knee crepitus.  Mary [name] has discomfort with flexion extension of the right knee joint.  Mary [name] is primarily wheelchair-bound at this point.  Mary [name] tried wearing a brace on the right knee in the past but did not find much benefit and discontinued.  Flexion contractures -Mary [name] has bilateral elbow joint flexion contractures.   Other medical conditions are listed as follows:  History of kidney stones  History of hyperlipidemia  History of shingles   History of hypothyroidism   History of diabetes mellitus   Essential hypertension   Anemia, unspecified type   Orders: No orders of the defined types were placed in this encounter.  No orders of the defined  types were placed in this encounter.    Follow-Up Instructions: Return in about 5 months (around 02/20/2019) for Rheumatoid arthritis, Osteoarthritis, Gout, Osteoporosis.   Ofilia Neas, PA-C  Note - This record has been created using Dragon software.  Chart creation errors have been sought, but may not always  have been located. Such creation errors do not reflect on  the standard of medical care.

## 2018-09-20 ENCOUNTER — Ambulatory Visit (INDEPENDENT_AMBULATORY_CARE_PROVIDER_SITE_OTHER): Payer: Medicare HMO | Admitting: Physician Assistant

## 2018-09-20 ENCOUNTER — Other Ambulatory Visit: Payer: Self-pay

## 2018-09-20 ENCOUNTER — Encounter: Payer: Self-pay | Admitting: Physician Assistant

## 2018-09-20 VITALS — BP 144/79 | HR 90 | Resp 15 | Ht 60.0 in | Wt 154.0 lb

## 2018-09-20 DIAGNOSIS — Z8619 Personal history of other infectious and parasitic diseases: Secondary | ICD-10-CM

## 2018-09-20 DIAGNOSIS — M0609 Rheumatoid arthritis without rheumatoid factor, multiple sites: Secondary | ICD-10-CM

## 2018-09-20 DIAGNOSIS — Z79899 Other long term (current) drug therapy: Secondary | ICD-10-CM | POA: Diagnosis not present

## 2018-09-20 DIAGNOSIS — M810 Age-related osteoporosis without current pathological fracture: Secondary | ICD-10-CM | POA: Diagnosis not present

## 2018-09-20 DIAGNOSIS — M2450 Contracture, unspecified joint: Secondary | ICD-10-CM

## 2018-09-20 DIAGNOSIS — Z8639 Personal history of other endocrine, nutritional and metabolic disease: Secondary | ICD-10-CM

## 2018-09-20 DIAGNOSIS — Z87442 Personal history of urinary calculi: Secondary | ICD-10-CM

## 2018-09-20 DIAGNOSIS — D649 Anemia, unspecified: Secondary | ICD-10-CM

## 2018-09-20 DIAGNOSIS — I10 Essential (primary) hypertension: Secondary | ICD-10-CM

## 2018-09-20 DIAGNOSIS — M1A09X0 Idiopathic chronic gout, multiple sites, without tophus (tophi): Secondary | ICD-10-CM | POA: Diagnosis not present

## 2018-09-20 DIAGNOSIS — M170 Bilateral primary osteoarthritis of knee: Secondary | ICD-10-CM

## 2018-09-20 NOTE — Patient Instructions (Signed)
Standing Labs We placed an order today for your standing lab work.    Please come back and get your standing labs in August and every 3 months  We have open lab daily Monday through Thursday from 8:30-12:30 PM and 1:30-4:30 PM and Friday from 8:30-12:30 PM and 1:30 -4:00 PM at the office of Dr. Shaili Deveshwar.   You may experience shorter wait times on Monday and Friday afternoons. The office is located at [address], [address], [place],  [address] No appointment is necessary.   Labs are drawn by [facility].  You may receive a bill from [facility] for your lab work.  If you wish to have your labs drawn at another location, please call the office 24 hours in advance to send orders.  If you have any questions regarding directions or hours of operation,  please call [phone].   Just as a reminder please drink plenty of water prior to coming for your lab work. Thanks!   

## 2018-10-19 ENCOUNTER — Telehealth: Payer: Self-pay | Admitting: Rheumatology

## 2018-10-19 DIAGNOSIS — Z79899 Other long term (current) drug therapy: Secondary | ICD-10-CM

## 2018-10-19 NOTE — Telephone Encounter (Signed)
(  1)  Patient called requesting labwork orders be sent to [facility] in [place].  Patient will be going on Friday, 10/21/18.      (2)  Patient also requested prescription refill of leflunomide to be sent to [facility].

## 2018-10-19 NOTE — Telephone Encounter (Signed)
1. Labs have been released.  2. Too soon to refill Arava. 90 day supply sent 08/15/18.

## 2018-10-20 NOTE — Telephone Encounter (Signed)
Patient advised lab orders released. Patient advised too soon to refill Arava as a 90 day supply was sent on 08/15/18. Patient was given 30 day supply. Advised her to contact the pharmacy as she should have a refill.

## 2018-10-25 LAB — CMP14+EGFR
ALT: 11 IU/L (ref 0–32)
AST: 12 IU/L (ref 0–40)
Albumin/Globulin Ratio: 1.8 (ref 1.2–2.2)
Albumin: 4 g/dL (ref 3.7–4.7)
Alkaline Phosphatase: 81 IU/L (ref 39–117)
BUN/Creatinine Ratio: 34 — ABNORMAL HIGH (ref 12–28)
BUN: 20 mg/dL (ref 8–27)
Bilirubin Total: 0.3 mg/dL (ref 0.0–1.2)
CO2: 17 mmol/L — ABNORMAL LOW (ref 20–29)
Calcium: 9 mg/dL (ref 8.7–10.3)
Chloride: 107 mmol/L — ABNORMAL HIGH (ref 96–106)
Creatinine, Ser: 0.59 mg/dL (ref 0.57–1.00)
GFR calc Af Amer: 103 mL/min/{1.73_m2} (ref >59)
GFR calc non Af Amer: 89 mL/min/{1.73_m2} (ref >59)
Globulin, Total: 2.2 g/dL (ref 1.5–4.5)
Glucose: 228 mg/dL — ABNORMAL HIGH (ref 65–99)
Potassium: 4.6 mmol/L (ref 3.5–5.2)
Sodium: 139 mmol/L (ref 134–144)
Total Protein: 6.2 g/dL (ref 6.0–8.5)

## 2018-10-25 LAB — CBC WITH DIFFERENTIAL/PLATELET
Basophils Absolute: 0.1 10*3/uL (ref 0.0–0.2)
Basos: 1 %
EOS (ABSOLUTE): 0.8 10*3/uL — ABNORMAL HIGH (ref 0.0–0.4)
Eos: 11 %
Hematocrit: 32 % — ABNORMAL LOW (ref 34.0–46.6)
Hemoglobin: 10.6 g/dL — ABNORMAL LOW (ref 11.1–15.9)
Lymphocytes Absolute: 1.5 10*3/uL (ref 0.7–3.1)
Lymphs: 20 %
MCH: 29.2 pg (ref 26.6–33.0)
MCHC: 33.1 g/dL (ref 31.5–35.7)
MCV: 88 fL (ref 79–97)
Monocytes Absolute: 0.6 10*3/uL (ref 0.1–0.9)
Monocytes: 8 %
Neutrophils Absolute: 4.4 10*3/uL (ref 1.4–7.0)
Neutrophils: 60 %
Platelets: 239 10*3/uL (ref 150–450)
RBC: 3.63 x10E6/uL — ABNORMAL LOW (ref 3.77–5.28)
RDW: 15.2 % (ref 11.7–15.4)
WBC: 7.3 10*3/uL (ref 3.4–10.8)

## 2018-10-25 NOTE — Telephone Encounter (Signed)
Patient is still has anemia.  Her glucose is elevated.  Otherwise her labs are stable.  Please forward labs to her PCP and notify patient.

## 2018-11-16 ENCOUNTER — Other Ambulatory Visit: Payer: Self-pay | Admitting: Rheumatology

## 2018-11-16 NOTE — Telephone Encounter (Signed)
Last visit: 09/20/18 Next visit: 11/25/18 Labs: 10/24/18 has anemia. Her glucose is elevated. Otherwise her labs are stable  Okay to refill per Dr. Estanislado Pandy

## 2018-11-17 ENCOUNTER — Ambulatory Visit: Payer: Medicare HMO | Admitting: Pulmonary Disease

## 2018-11-25 ENCOUNTER — Ambulatory Visit: Payer: Medicare HMO | Admitting: Pulmonary Disease

## 2018-12-01 ENCOUNTER — Other Ambulatory Visit: Payer: Self-pay

## 2018-12-01 ENCOUNTER — Ambulatory Visit (INDEPENDENT_AMBULATORY_CARE_PROVIDER_SITE_OTHER): Payer: Medicare HMO | Admitting: Pulmonary Disease

## 2018-12-01 ENCOUNTER — Encounter: Payer: Self-pay | Admitting: Pulmonary Disease

## 2018-12-01 DIAGNOSIS — J849 Interstitial pulmonary disease, unspecified: Secondary | ICD-10-CM

## 2018-12-01 DIAGNOSIS — Z23 Encounter for immunization: Secondary | ICD-10-CM

## 2018-12-01 MED ORDER — IPRATROPIUM-ALBUTEROL 0.5-2.5 (3) MG/3ML IN SOLN: 3.0000 mL | Freq: Four times a day (QID) | RESPIRATORY_TRACT | 3 refills | Status: DC

## 2018-12-01 NOTE — Progress Notes (Signed)
Elishia Kaczorowski    [id number]    [birth year]  Primary Care Physician:York, Ronn Melena, NP  Referring Physician: Imagene Riches, NP [address],  [place] [address]  Chief complaint:   Follow up for RA-ILD  HPI: 78 year old with history of rheumatoid arthritis, diabetes, gout, hypertension, hyperlipidemia and hypothyroidism. She is being followed by Dr. Estanislado Pandy for management of rheumatoid arthritis. She had been previously maintained on methotrexate. She was taken off methotrexate with plans to transition to leflunomide. Before initiation of leflunomide she was hospitalized July 2018 with respiratory failure, bilateral lung infiltrates. CT at that time did not show any pulmonary embolus but there are diffuse ground glass opacities concerning for interstitial lung disease or edema.   She was started on prednisone and diuresed with improvement in symptoms. Bronchoscopy was considered but deferred due to improvement in respiratory status. She has been discharged on a prednisone taper and has followed up with Dr. Estanislado Pandy and is currently on [place].  Interim History:. States that breathing is at baseline.  Continues on Arava Plaquenil stopped by rheumatology on recommendation of her ophthalmologist.  Outpatient Encounter Medications as of 12/01/2018  Medication Sig  . acetaminophen (TYLENOL) 500 MG tablet as needed.   Marland Kitchen albuterol (PROVENTIL) (2.5 MG/3ML) 0.083% nebulizer solution Take 3 mLs (2.5 mg total) by nebulization every 6 (six) hours as needed for wheezing or shortness of breath.  Marland Kitchen alendronate (FOSAMAX) 70 MG tablet TAKE 1 TABLET WEEKLY, SAME TIME EACH WEEK. TAKE ON EMPTY STOMACH WITHFULL GLASS OF WATER. DON'T LIE DOWN 30 MIN.  Marland Kitchen allopurinol (ZYLOPRIM) 100 MG tablet Take 100 mg by mouth daily.   Marland Kitchen atorvastatin (LIPITOR) 10 MG tablet Take 10 mg by mouth daily.  . benazepril (LOTENSIN) 20 MG tablet Take 1 tablet (20 mg total) by mouth daily.  . Cholecalciferol (VITAMIN  D3) 5000 units TABS Take 1 tablet by mouth. Takes on Monday, Wednesday, and Friday.  . clorazepate (TRANXENE) 7.5 MG tablet Take 7.5 mg by mouth 2 (two) times daily.   Marland Kitchen ipratropium-albuterol (DUONEB) 0.5-2.5 (3) MG/3ML SOLN Take 3 mLs by nebulization 4 (four) times daily.  Marland Kitchen leflunomide (ARAVA) 20 MG tablet Take 1 tablet (20 mg total) by mouth daily.  Marland Kitchen levothyroxine (SYNTHROID, LEVOTHROID) 75 MCG tablet Take 75 mcg by mouth daily.   . [DISCONTINUED] Insulin Pen Needle (PEN NEEDLES 3/16") 31G X 5 MM MISC Use 1 pen needle to inject Forteo daily.   No facility-administered encounter medications on file as of 12/01/2018.    Physical Exam: Blood pressure 118/76, pulse 77, temperature (!) 97.2 F (36.2 C), temperature source Temporal, height 5' (1.524 m), weight 153 lb (69.4 kg), SpO2 97 %. Gen:      No acute distress HEENT:  EOMI, sclera anicteric Neck:     No masses; no thyromegaly Lungs:    Clear to auscultation bilaterally; normal respiratory effort CV:         Regular rate and rhythm; no murmurs Abd:      + bowel sounds; soft, non-tender; no palpable masses, no distension Ext:    No edema; adequate peripheral perfusion Skin:      Warm and dry; no rash Neuro: alert and oriented x 3 Psych: normal mood and affect  Data Reviewed: Imaging CTA chest 09/03/16- Negative for pulmonary embolus. Extensive bilateral ground-glass opacities, bilateral hilar and mediastinal lymph nodes. Calcific aortic and coronary atherosclerosis. Hiatal hernia.  Chest x-ray 09/07/16-improvement in interstitial opacities. Chest x-ray 10/12/16-stable  interstitial opacities Chest x-ray 08/17/17- mild stable cardiomegaly, no active lung disease. I reviewed the images personally.  PFTs PFTs-unable to obtain.  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 Continues on Arava per rheumatology. Continue monitoring.  Schedule high-res CT and spirometry, diffusion capacity.  Mild persistent asthma As the prednisone was tapered off in 2018 she has developed some wheezing.  Unable to obtain PFTs due to inability to follow instructions.  Has mild elevation in FENO and peripheral eosinophilia.  She was temporarily on Brovana and Pulmicort.  Now off them as her breathing has continued to improve Continue duo nebs as needed.  Health maintenance Flu vaccine today 09/04/2015- Pneumovax.  Plan/Recommendations: - Continue duo nebs as needed - High-res CT, spirometry, diffusion capacity - Flu vaccine  Follow up in 3 months  Chilton Greathouse MD [facility] 12/01/2018, 10:14 AM  CC: Dema Severin, NP  Pollyann Savoy MD

## 2018-12-01 NOTE — Patient Instructions (Signed)
We will schedule you for high-resolution CT, spirometry and diffusion capacity in 3 months for reevaluation of your lung disease Continue the duo nebs We will give a flu vaccine today Follow-up in 3 months after CT scan and lung function test.

## 2018-12-13 ENCOUNTER — Other Ambulatory Visit: Payer: Self-pay | Admitting: Rheumatology

## 2018-12-13 ENCOUNTER — Other Ambulatory Visit: Payer: Self-pay | Admitting: Pulmonary Disease

## 2018-12-13 DIAGNOSIS — J849 Interstitial pulmonary disease, unspecified: Secondary | ICD-10-CM

## 2018-12-13 NOTE — Telephone Encounter (Addendum)
Last visit: 09/20/18 Next Visit:  Due December 2020. Message sent to the front to schedule patient.  Labs: 10/24/18 has anemia. Her glucose is elevated. Otherwise her labs are stable  Okay to refill per Dr. Estanislado Pandy

## 2018-12-13 NOTE — Telephone Encounter (Signed)
Please schedule patient for a follow up visit in December 2020. Thanks!

## 2018-12-19 ENCOUNTER — Encounter: Payer: Self-pay | Admitting: Physician Assistant

## 2019-01-18 ENCOUNTER — Other Ambulatory Visit: Payer: Self-pay | Admitting: Rheumatology

## 2019-01-18 NOTE — Telephone Encounter (Signed)
Last visit: 09/20/18 Next Visit:   02/06/19 Labs: 8/24/20has anemia. Her glucose is elevated. Otherwise her labs are stable  Okay to refill per Dr. Estanislado Pandy

## 2019-02-02 NOTE — Progress Notes (Signed)
Virtual Visit via Telephone Note  I connected with Mary [name] on 02/03/19 at  8:30 AM EST by telephone and verified that I am speaking with the correct person using two identifiers.  Location: Patient: Home  Provider: Clinic  This service was conducted via virtual visit.  Both audio and visual tools were used.  The patient was located at home. I was located in my office.  Consent was obtained prior to the virtual visit and is aware of possible charges through their insurance for this visit.  The patient is an established patient.  Dr. Corliss Skains, MD conducted the virtual visit and Sherron Ales, PA-C acted as scribe during the service.  Office staff helped with scheduling follow up visits after the service was conducted.   I discussed the limitations, risks, security and privacy concerns of performing an evaluation and management service by telephone and the availability of in person appointments. I also discussed with the patient that there may be a patient responsible charge related to this service. The patient expressed understanding and agreed to proceed.  CC: Pain in both knee joints  History of Present Illness: Patient is a 78 year old female with a past medical history of seronegative rheumatoid arthritis, gout, and osteoporosis.  She is taking Arava 20 mg 1 tablet by mouth daily. She continues to have pain in both knee joints.  She states the pain has been severe recently.  She has intermittent warmth and swelling.  She is no longer using knee braces.  She is primarily wheelchair bound. She denies any wrist joint pain at this time. She has minimal morning stiffness daily.   She takes allopurinol 100 mg po daily for management of gout. She has not had any recent gout flares.  She continues to take fosamax 70 mg 1 tablet once weekly for management of osteoporosis.    She rates her RA a 4/10 currently.    Review of Systems  Constitutional: Negative for fever and malaise/fatigue.  Eyes:  Negative for photophobia, pain, discharge and redness.  Respiratory: Negative for cough, shortness of breath and wheezing.   Cardiovascular: Negative for chest pain and palpitations.  Gastrointestinal: Negative for blood in stool, constipation and diarrhea.  Genitourinary: Negative for dysuria.  Musculoskeletal: Positive for joint pain. Negative for back pain, myalgias and neck pain.       +Joint swelling +Morning stiffness   Skin: Negative for rash.  Neurological: Negative for dizziness and headaches.  Psychiatric/Behavioral: Negative for depression. The patient is not nervous/anxious and does not have insomnia.       Observations/Objective: Physical Exam  Constitutional: She is oriented to person, place, and time.  Neurological: She is alert and oriented to person, place, and time.  Psychiatric: Mood, memory, affect and judgment normal.   Patient reports morning stiffness for 2   minutes.   Patient denies nocturnal pain.  Difficulty dressing/grooming: Denies Difficulty climbing stairs: Reports Difficulty getting out of chair: Reports Difficulty using hands for taps, buttons, cutlery, and/or writing: Denies    Assessment and Plan: Visit Diagnoses: Rheumatoid arthritis of multiple sites with negative rheumatoid factor (HCC): She has not had any recent rheumatoid arthritis flares.  She is clinically doing well on Arava 20 mg 1 tablet by mouth daily.  She has chronic pain in both knee joints and has going inflammation bilaterally.  We will apply for visco gel injections since she is not a good candidate for repeat cortisone injections due to hyperglycemia.  She is not having any other  joint pain or joint swelling.  She will continue taking arava as prescribed.  She was advised to notify us if she develops increased joint pain or joint swelling. She will follow up in 4-5 months.   High risk medication use -Arava 20 mg 1 tablet daily.  She previously discontinued SSZ due to SE of  diarrhea.   She previously discontinued PLQ 200 mg BID M-F per ophthalmology recommendation.  CBC and CMP were drawn on 12/19/18.  She is due to for lab work in January and every 3 months.  Standing orders placed today.   Idiopathic chronic gout of multiple sites without tophus - She has not had any recent gout flares.  She is taking allopurinol 100 mg po daily.  Uric acid was 5.0 on 01/28/18. We will place a future order for uric acid today.  She was advised to notify us if she develops signs or symptoms of a gout flare.   Age-related osteoporosis without current pathological fracture - She is taking Fosamax 70 mg po once weekly along with a vitamin D supplement.Most recent DEXA on 04/11/2018 showed T-score of -3.4 at spine and negative 11% change in BMD at right hip compared to previous DEXA in October 2017.  Primary osteoarthritis of both knees -She has chronic pain in both knee joints.  She has been experiencing severe pain in both knee joints.  She has intermittent warmth and swelling. She is no longer using knee joint braces.  She is primarily wheelchair bound.  She is not a good candidate for recurrent cortisone injections due to hyperglycemia.  We will apply for visco gel injections for both knee joints.  She will require updated x-rays at the injection office visit.   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   Other medical conditions are listed as follows:  History of kidney stones  History of hyperlipidemia  History of shingles   History of hypothyroidism   History of diabetes mellitus   Essential hypertension   Anemia, unspecified type  Follow Up Instructions: She will follow up in 4-5 months.    I discussed the assessment and treatment plan with the patient. The patient was provided an opportunity to ask questions and all were answered. The patient agreed with the plan and demonstrated an understanding of the instructions.   The  patient was advised to call back or seek an in-person evaluation if the symptoms worsen or if the condition fails to improve as anticipated.  I provided 15 minutes of non-face-to-face time during this encounter.   Bo Merino, MD   Scribed by-  Hazel Sams, PA-C

## 2019-02-03 ENCOUNTER — Other Ambulatory Visit: Payer: Self-pay

## 2019-02-03 ENCOUNTER — Telehealth (INDEPENDENT_AMBULATORY_CARE_PROVIDER_SITE_OTHER): Payer: Medicare HMO | Admitting: Rheumatology

## 2019-02-03 ENCOUNTER — Encounter: Payer: Self-pay | Admitting: Rheumatology

## 2019-02-03 DIAGNOSIS — Z79899 Other long term (current) drug therapy: Secondary | ICD-10-CM

## 2019-02-03 DIAGNOSIS — I10 Essential (primary) hypertension: Secondary | ICD-10-CM

## 2019-02-03 DIAGNOSIS — M810 Age-related osteoporosis without current pathological fracture: Secondary | ICD-10-CM | POA: Diagnosis not present

## 2019-02-03 DIAGNOSIS — Z8619 Personal history of other infectious and parasitic diseases: Secondary | ICD-10-CM

## 2019-02-03 DIAGNOSIS — M0609 Rheumatoid arthritis without rheumatoid factor, multiple sites: Secondary | ICD-10-CM | POA: Diagnosis not present

## 2019-02-03 DIAGNOSIS — Z87442 Personal history of urinary calculi: Secondary | ICD-10-CM

## 2019-02-03 DIAGNOSIS — Z8639 Personal history of other endocrine, nutritional and metabolic disease: Secondary | ICD-10-CM

## 2019-02-03 DIAGNOSIS — M170 Bilateral primary osteoarthritis of knee: Secondary | ICD-10-CM

## 2019-02-03 DIAGNOSIS — M2450 Contracture, unspecified joint: Secondary | ICD-10-CM

## 2019-02-03 DIAGNOSIS — M1A09X0 Idiopathic chronic gout, multiple sites, without tophus (tophi): Secondary | ICD-10-CM | POA: Diagnosis not present

## 2019-02-06 ENCOUNTER — Ambulatory Visit: Payer: Medicare HMO | Admitting: Physician Assistant

## 2019-02-07 ENCOUNTER — Telehealth: Payer: Self-pay | Admitting: *Deleted

## 2019-02-07 NOTE — Telephone Encounter (Signed)
Apply for Synvisc One due to transportation issues, Bilateral knees.

## 2019-03-07 ENCOUNTER — Ambulatory Visit (INDEPENDENT_AMBULATORY_CARE_PROVIDER_SITE_OTHER)
Admission: RE | Admit: 2019-03-07 | Discharge: 2019-03-07 | Disposition: A | Discharge status: Home / Self Care | Payer: Medicare HMO | Source: Ambulatory Visit | Attending: Pulmonary Disease | Admitting: Pulmonary Disease

## 2019-03-07 ENCOUNTER — Other Ambulatory Visit: Payer: Self-pay

## 2019-03-07 DIAGNOSIS — J849 Interstitial pulmonary disease, unspecified: Secondary | ICD-10-CM | POA: Diagnosis not present

## 2019-03-09 NOTE — Telephone Encounter (Signed)
Please call patient to schedule Visco injections.   Monovisc, Bilateral Knees Buy and Bill No PA required No Co Pay

## 2019-03-13 ENCOUNTER — Telehealth: Payer: Self-pay | Admitting: Rheumatology

## 2019-03-13 DIAGNOSIS — Z79899 Other long term (current) drug therapy: Secondary | ICD-10-CM

## 2019-03-13 DIAGNOSIS — M1A09X0 Idiopathic chronic gout, multiple sites, without tophus (tophi): Secondary | ICD-10-CM

## 2019-03-13 NOTE — Telephone Encounter (Signed)
Lab orders released for [facility].  

## 2019-03-13 NOTE — Telephone Encounter (Signed)
Patient called requesting her labwork orders be sent to [facility] at [facility].  Patient states she will be going on Wednesday, 03/15/19.

## 2019-03-14 ENCOUNTER — Encounter: Payer: Self-pay | Admitting: Rheumatology

## 2019-03-14 NOTE — Telephone Encounter (Signed)
I spoke with [name] patient's daughter.  She states the patient did much better on Plaquenil but it was discontinued as she could not get the eye examination as her ophthalmologist did not have the machine.  She will take her to a different ophthalmologist and get the eye examination.  Once she gets clearance that she would like to start her back on Plaquenil.  I was in agreement.  Patient will contact us when she gets the clearance from the ophthalmologist.

## 2019-03-16 LAB — URIC ACID: Uric Acid: 6.3 mg/dL (ref 3.1–7.9)

## 2019-03-16 LAB — CMP14+EGFR
ALT: 8 IU/L (ref 0–32)
AST: 12 IU/L (ref 0–40)
Albumin/Globulin Ratio: 1.5 (ref 1.2–2.2)
Albumin: 4 g/dL (ref 3.7–4.7)
Alkaline Phosphatase: 95 IU/L (ref 39–117)
BUN/Creatinine Ratio: 37 — ABNORMAL HIGH (ref 12–28)
BUN: 22 mg/dL (ref 8–27)
Bilirubin Total: 0.3 mg/dL (ref 0.0–1.2)
CO2: 19 mmol/L — ABNORMAL LOW (ref 20–29)
Calcium: 9.6 mg/dL (ref 8.7–10.3)
Chloride: 105 mmol/L (ref 96–106)
Creatinine, Ser: 0.6 mg/dL (ref 0.57–1.00)
GFR calc Af Amer: 102 mL/min/{1.73_m2} (ref >59)
GFR calc non Af Amer: 89 mL/min/{1.73_m2} (ref >59)
Globulin, Total: 2.6 g/dL (ref 1.5–4.5)
Glucose: 252 mg/dL — ABNORMAL HIGH (ref 65–99)
Potassium: 4.7 mmol/L (ref 3.5–5.2)
Sodium: 136 mmol/L (ref 134–144)
Total Protein: 6.6 g/dL (ref 6.0–8.5)

## 2019-03-16 LAB — CBC WITH DIFFERENTIAL/PLATELET
Basophils Absolute: 0.1 10*3/uL (ref 0.0–0.2)
Basos: 1 %
EOS (ABSOLUTE): 0.6 10*3/uL — ABNORMAL HIGH (ref 0.0–0.4)
Eos: 8 %
Hematocrit: 35.2 % (ref 34.0–46.6)
Hemoglobin: 11.5 g/dL (ref 11.1–15.9)
Lymphocytes Absolute: 1.6 10*3/uL (ref 0.7–3.1)
Lymphs: 21 %
MCH: 29 pg (ref 26.6–33.0)
MCHC: 32.7 g/dL (ref 31.5–35.7)
MCV: 89 fL (ref 79–97)
Monocytes Absolute: 0.4 10*3/uL (ref 0.1–0.9)
Monocytes: 5 %
Neutrophils Absolute: 5 10*3/uL (ref 1.4–7.0)
Neutrophils: 65 %
Platelets: 245 10*3/uL (ref 150–450)
RBC: 3.97 x10E6/uL (ref 3.77–5.28)
RDW: 15.2 % (ref 11.7–15.4)
WBC: 7.7 10*3/uL (ref 3.4–10.8)

## 2019-03-16 NOTE — Telephone Encounter (Signed)
Glucose is very elevated-252.  Please notify patient.  Rest of CMP stable. Uric acid is 6.3.  ideally her uric acid level should be less than 6.  Please advise patient to avoid purine rich foods.  She should continue to take allopurinol 100 mg po daily.

## 2019-03-16 NOTE — Telephone Encounter (Signed)
CBC is normal.

## 2019-03-18 ENCOUNTER — Other Ambulatory Visit (HOSPITAL_COMMUNITY)
Admission: RE | Admit: 2019-03-18 | Discharge: 2019-03-18 | Disposition: A | Discharge status: Home / Self Care | Payer: Medicare HMO | Source: Ambulatory Visit | Attending: Pulmonary Disease | Admitting: Pulmonary Disease

## 2019-03-18 DIAGNOSIS — Z20822 Contact with and (suspected) exposure to covid-19: Secondary | ICD-10-CM | POA: Insufficient documentation

## 2019-03-18 DIAGNOSIS — Z01812 Encounter for preprocedural laboratory examination: Secondary | ICD-10-CM | POA: Diagnosis present

## 2019-03-19 LAB — NOVEL CORONAVIRUS, NAA (HOSP ORDER, SEND-OUT TO REF LAB; TAT 18-24 HRS): SARS-CoV-2, NAA: NOT DETECTED

## 2019-03-22 ENCOUNTER — Ambulatory Visit: Payer: Medicare HMO | Admitting: Pulmonary Disease

## 2019-03-22 ENCOUNTER — Ambulatory Visit: Payer: Medicare HMO

## 2019-03-22 ENCOUNTER — Other Ambulatory Visit: Payer: Self-pay

## 2019-03-22 ENCOUNTER — Encounter: Payer: Self-pay | Admitting: Pulmonary Disease

## 2019-03-22 VITALS — BP 114/72 | HR 84 | Temp 97.7°F | Ht 60.0 in | Wt 152.0 lb

## 2019-03-22 DIAGNOSIS — J4530 Mild persistent asthma, uncomplicated: Secondary | ICD-10-CM | POA: Diagnosis not present

## 2019-03-22 DIAGNOSIS — J849 Interstitial pulmonary disease, unspecified: Secondary | ICD-10-CM | POA: Diagnosis not present

## 2019-03-22 NOTE — Patient Instructions (Signed)
Glad you are doing well with regard to your breathing Your CT scan shows some scarring which is likely from the rheumatoid arthritis We will continue to monitor this closely Follow-up in 6 months.

## 2019-03-22 NOTE — Progress Notes (Addendum)
Mary [name]    [id number]    [birth year]  Primary Care Physician:York, Ronn Melena, NP  Referring Physician: Imagene Riches, NP [address] [place],  [place] [address]  Chief complaint:   Follow up for RA-ILD  HPI: 79 year old with history of rheumatoid arthritis, diabetes, gout, hypertension, hyperlipidemia and hypothyroidism. She is being followed by Dr. Estanislado Pandy for management of rheumatoid arthritis. She had been previously maintained on methotrexate. She was taken off methotrexate with plans to transition to leflunomide. Before initiation of leflunomide she was hospitalized July 2018 with respiratory failure, bilateral lung infiltrates. CT at that time did not show any pulmonary embolus but there are diffuse ground glass opacities concerning for interstitial lung disease or edema.   She was started on prednisone and diuresed with improvement in symptoms. Bronchoscopy was considered but deferred due to improvement in respiratory status. She has been discharged on a prednisone taper and has followed up with Dr. Estanislado Pandy and is currently on [place]. Plaquenil stopped by rheumatology in June 2020 on recommendation of her ophthalmologist.  Interim History:. States that breathing is at baseline.  Continues on Arava Unable to complete PFTs today.  Outpatient Encounter Medications as of 03/22/2019  Medication Sig  . acetaminophen (TYLENOL) 500 MG tablet as needed.   Marland Kitchen albuterol (PROVENTIL) (2.5 MG/3ML) 0.083% nebulizer solution Take 3 mLs (2.5 mg total) by nebulization every 6 (six) hours as needed for wheezing or shortness of breath.  Marland Kitchen alendronate (FOSAMAX) 70 MG tablet TAKE 1 TABLET WEEKLY, SAME TIME EACH WEEK. TAKE ON EMPTY STOMACH WITHFULL GLASS OF WATER. DON'T LIE DOWN 30 MIN.  Marland Kitchen allopurinol (ZYLOPRIM) 100 MG tablet Take 100 mg by mouth daily.   Marland Kitchen atorvastatin (LIPITOR) 10 MG tablet Take 10 mg by mouth daily.  . benazepril (LOTENSIN) 20 MG tablet Take 1 tablet (20 mg total) by  mouth daily.  . Cholecalciferol (VITAMIN D3) 5000 units TABS Take 1 tablet by mouth. Takes on Monday, Wednesday, and Friday.  . clorazepate (TRANXENE) 7.5 MG tablet Take 7.5 mg by mouth 2 (two) times daily.   Marland Kitchen ipratropium-albuterol (DUONEB) 0.5-2.5 (3) MG/3ML SOLN Take 3 mLs by nebulization 4 (four) times daily.  Marland Kitchen leflunomide (ARAVA) 20 MG tablet TAKE 1 TABLET BY MOUTH ONCE DAILY.  Marland Kitchen levothyroxine (SYNTHROID, LEVOTHROID) 75 MCG tablet Take 75 mcg by mouth daily.    No facility-administered encounter medications on file as of 03/22/2019.   Physical Exam: Blood pressure 114/72, pulse 84, temperature 97.7 F (36.5 C), temperature source Temporal, height 5' (1.524 m), weight 152 lb (68.9 kg), SpO2 94 %. Gen:      No acute distress HEENT:  EOMI, sclera anicteric Neck:     No masses; no thyromegaly Lungs:    Clear to auscultation bilaterally; normal respiratory effort CV:         Regular rate and rhythm; no murmurs Abd:      + bowel sounds; soft, non-tender; no palpable masses, no distension Ext:    No edema; adequate peripheral perfusion Skin:      Warm and dry; no rash Neuro: alert and oriented x 3 Psych: normal mood and affect  Data Reviewed: Imaging CTA chest 09/03/16- Negative for pulmonary embolus. Extensive bilateral ground-glass opacities, bilateral hilar and mediastinal lymph nodes. Calcific aortic and coronary atherosclerosis. Hiatal hernia.  Chest x-ray 09/07/16-improvement in interstitial opacities. Chest x-ray 10/12/16-stable interstitial opacities Chest x-ray 08/17/17- mild stable cardiomegaly, no active lung disease. 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 Continues on Arava per rheumatology.  CT reviewed with UIP fibrosis.  Clinically she appears stable and on comparison with CT in 2018 there appears to be honeycombing even then.  We will continue monitoring for now. Consider antifibrotic if there is progression in the future but she is not interested in any therapy at present  Mild persistent asthma As the prednisone was tapered off in 2018 she has developed some wheezing.  Unable to obtain PFTs due to inability to follow instructions.  Has mild elevation in FENO and peripheral eosinophilia.  She was temporarily on Brovana and Pulmicort.  Now off them as her breathing has continued to improve Continue duo nebs as needed.  Health maintenance 12/01/2018-influenza 09/04/2015- Pneumovax.  Plan/Recommendations: - Continue duo nebs as needed  Follow-up in 6 months  Chilton Greathouse MD [facility] 03/22/2019, 2:12 PM  CC: Dema Severin, NP  Pollyann Savoy MD

## 2019-04-04 NOTE — Telephone Encounter (Signed)
Patient asked that we call her daughter [name] to schedule the appointment.

## 2019-04-12 NOTE — Telephone Encounter (Signed)
New Prior Auth/ VOB for Visco Knee injections 2021.  Please schedule patient for Monovisc Bilateral Knees. Buy and [employer] Deductible does not apply Co- Pay of $25.00 each visit Once OOP is met service covered at 100%. ( to date $169.25 met of $3900.)

## 2019-04-12 NOTE — Telephone Encounter (Signed)
Opened in error

## 2019-05-08 ENCOUNTER — Other Ambulatory Visit: Payer: Self-pay

## 2019-05-08 ENCOUNTER — Ambulatory Visit: Payer: Medicare HMO | Admitting: Physician Assistant

## 2019-05-08 DIAGNOSIS — M170 Bilateral primary osteoarthritis of knee: Secondary | ICD-10-CM

## 2019-05-08 MED ORDER — LIDOCAINE HCL 1 % IJ SOLN
1.5000 mL | INTRAMUSCULAR | Status: AC | PRN
  Administered 2019-05-08: 1.5 mL

## 2019-05-08 MED ORDER — HYALURONAN 88 MG/4ML IX SOSY
88.0000 mg | PREFILLED_SYRINGE | INTRA_ARTICULAR | Status: AC | PRN
  Administered 2019-05-08: 88 mg via INTRA_ARTICULAR

## 2019-05-08 NOTE — Progress Notes (Signed)
   Procedure Note  Patient: Mary [name]             Date of Birth: [birth year]           MRN: [id number]             Visit Date: 05/08/2019  Procedures: Visit Diagnoses:  1. Primary osteoarthritis of both knees      Monovisc bilateral knees, B/B Large Joint Inj: bilateral knee on 05/08/2019 12:04 PM Details: 22 G 1.5 in needle, medial approach  Arthrogram: No  Medications (Right): 1.5 mL lidocaine 1 %; 88 mg Hyaluronan 88 MG/4ML Aspirate (Right): 0 mL Medications (Left): 1.5 mL lidocaine 1 %; 88 mg Hyaluronan 88 MG/4ML Aspirate (Left): 0 mL Outcome: tolerated well, no immediate complications Procedure, treatment alternatives, risks and benefits explained, specific risks discussed. Consent was given by the patient. Immediately prior to procedure a time out was called to verify the correct patient, procedure, equipment, support staff and site/side marked as required. Patient was prepped and draped in the usual sterile fashion.     Patient tolerated the procedure well.  Aftercare was discussed.  Sherron Ales, PA-C

## 2019-05-15 ENCOUNTER — Other Ambulatory Visit: Payer: Self-pay | Admitting: Rheumatology

## 2019-05-15 NOTE — Telephone Encounter (Signed)
Last Visit: 02/03/19 Next Visit: 07/04/19 Labs: 03/15/19 Glucose is very elevated-252. Rest of CMP stable. Uric acid is 6.3.   Okay to refill per Dr. Corliss Skains

## 2019-05-18 ENCOUNTER — Telehealth: Payer: Self-pay | Admitting: *Deleted

## 2019-05-18 NOTE — Telephone Encounter (Signed)
Labs received from [facility]. Unable to locate in Care Everywhere  Folate/ Iron Profile/ Vitamin B-12  Transferrin 211 Ferritin 442 % Saturation 13

## 2019-05-29 ENCOUNTER — Telehealth: Payer: Self-pay | Admitting: Rheumatology

## 2019-05-29 NOTE — Telephone Encounter (Signed)
Left message to advise Mary [name] we have not received PLQ eye exam results. Provided fax number so she may call the eye doctor and have them fax th results to our office.

## 2019-05-29 NOTE — Telephone Encounter (Signed)
Patient's daughter Mary [name] called stating her mom had her Plaquenil eye exam at [facility] and is checking if Dr. Corliss Skains received the results.  Mary [name] states "she is anxious for her mom to take the Plaquenil because it helps with her joint pain."   Please advise.

## 2019-06-01 ENCOUNTER — Encounter: Payer: Self-pay | Admitting: Rheumatology

## 2019-06-07 ENCOUNTER — Telehealth: Payer: Self-pay | Admitting: Rheumatology

## 2019-06-07 NOTE — Telephone Encounter (Signed)
Patient's daughter [name] left a voicemail requesting a prescription for a [employer] for her mom.  [name] states she was talking to [facility] in [place] and was told they needed a prescription from her doctor.  [name] states it is desperately needed to transition her mom from her bed to the chair.

## 2019-06-07 NOTE — Telephone Encounter (Signed)
My chart message with request sent to Dr. Corliss Skains. Awaiting a response.

## 2019-06-07 NOTE — Telephone Encounter (Signed)
For the home health she will have to contact her PCP.  It is okay to give prescription for [facility] lift.

## 2019-06-08 ENCOUNTER — Telehealth: Payer: Self-pay | Admitting: Rheumatology

## 2019-06-08 NOTE — Telephone Encounter (Signed)
Patient's daughter advised her mother may need an office or virtual visit to discuss the [facility] lift as we doe not have it documented in an office note the need for it. [name] states she will be happy to schedule a virtual visit to discuss if that will be sufficient. Please advise if virtual visit is okay and when is the best time to schedule. Thanks!

## 2019-06-08 NOTE — Telephone Encounter (Signed)
Beth from [employer] called stating she received the prescription for [employer] from Dr. Corliss Skains.  Beth states they need a copy of the office visit note where the [facility] Lift was discussed faxed to [phone].  I spoke with Sue Lush and she stated a release of medical records form needs to be filled out in order to send the notes.    I called patient's daughter [name] and faxed the form to be filled out to [phone].  [name] states when she receives the form she will fax back to our office so the notes could be sent ASAP.

## 2019-06-08 NOTE — Telephone Encounter (Signed)
Prescription at the front desk. Patient's daughter advised prescription ready for pick up.

## 2019-06-08 NOTE — Telephone Encounter (Signed)
Ok to schedule a nonurgent visit.

## 2019-06-09 ENCOUNTER — Other Ambulatory Visit: Payer: Self-pay | Admitting: *Deleted

## 2019-06-09 NOTE — Telephone Encounter (Signed)
Message sent via my chart to advise we can schedule virtual visit.

## 2019-06-13 ENCOUNTER — Telehealth: Payer: Self-pay

## 2019-06-13 ENCOUNTER — Encounter: Payer: Self-pay | Admitting: Rheumatology

## 2019-06-13 ENCOUNTER — Other Ambulatory Visit: Payer: Self-pay

## 2019-06-13 ENCOUNTER — Telehealth (INDEPENDENT_AMBULATORY_CARE_PROVIDER_SITE_OTHER): Payer: Medicare HMO | Admitting: Rheumatology

## 2019-06-13 DIAGNOSIS — M810 Age-related osteoporosis without current pathological fracture: Secondary | ICD-10-CM | POA: Diagnosis not present

## 2019-06-13 DIAGNOSIS — M1A09X0 Idiopathic chronic gout, multiple sites, without tophus (tophi): Secondary | ICD-10-CM

## 2019-06-13 DIAGNOSIS — M0609 Rheumatoid arthritis without rheumatoid factor, multiple sites: Secondary | ICD-10-CM

## 2019-06-13 DIAGNOSIS — Z87442 Personal history of urinary calculi: Secondary | ICD-10-CM

## 2019-06-13 DIAGNOSIS — M2450 Contracture, unspecified joint: Secondary | ICD-10-CM

## 2019-06-13 DIAGNOSIS — M170 Bilateral primary osteoarthritis of knee: Secondary | ICD-10-CM

## 2019-06-13 DIAGNOSIS — I10 Essential (primary) hypertension: Secondary | ICD-10-CM

## 2019-06-13 DIAGNOSIS — Z8619 Personal history of other infectious and parasitic diseases: Secondary | ICD-10-CM

## 2019-06-13 DIAGNOSIS — Z79899 Other long term (current) drug therapy: Secondary | ICD-10-CM | POA: Diagnosis not present

## 2019-06-13 DIAGNOSIS — Z8639 Personal history of other endocrine, nutritional and metabolic disease: Secondary | ICD-10-CM

## 2019-06-13 NOTE — Progress Notes (Signed)
Virtual Visit via Telephone Note  I connected with Mary [name] on 06/13/19 at  3:45 PM EDT by telephone and verified that I am speaking with the correct person using two identifiers.  Location: Patient: Home  Provider: Clinic  This service was conducted via virtual visit. The patient was located at home. I was located in my office.  Consent was obtained prior to the virtual visit and is aware of possible charges through their insurance for this visit.  The patient is an established patient.  Dr. Corliss Skains, MD conducted the virtual visit and Sherron Ales, PA-C acted as scribe during the service.  Office staff helped with scheduling follow up visits after the service was conducted.   I discussed the limitations, risks, security and privacy concerns of performing an evaluation and management service by telephone and the availability of in person appointments. I also discussed with the patient that there may be a patient responsible charge related to this service. The patient expressed understanding and agreed to proceed.  CC: Pain in multiple joints  History of Present Illness: Patient is a 79 year old female with a past medical history of seronegative rheumatoid arthritis.  She is taking Arava 20 mg 1 tablet by mouth daily. She has difficulty gripping and opening things due to the pain and deformity in both hands.  She also cannot fully extend her elbow joints, which makes it difficult to push off when trying to get out of her wheelchair.  She has been unable to walk in over 1 year due to the severe pain in both hip joints, both knee joints, and both feet.  She experiences intermittently swelling in both knee joints.  She has lower extremity muscle weakness from not being able to walk. She cannot transfer herself from her wheelchair to her bed without some assistance.  She is concerned about falling.  She states her husband is unable to assist her as well.  She states her grandson will be moving soon  and will not be there to help her husband lift her, so she is concerned that she will be unable to transition from her wheelchair to bed.  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 She is not taking any antibiotics at this time.   Review of Systems  Constitutional: Negative for fever and malaise/fatigue.  HENT: Negative for congestion.   Eyes: Negative for photophobia, pain, discharge and redness.  Respiratory: Negative for cough, shortness of breath and wheezing.   Cardiovascular: Negative for chest pain, palpitations and leg swelling.  Gastrointestinal: Negative for blood in stool, constipation and diarrhea.  Genitourinary: Negative for dysuria and frequency.  Musculoskeletal: Positive for joint pain. Negative for back pain, myalgias and neck pain.  Skin: Negative for rash.  Neurological: Negative for dizziness, weakness and headaches.  Endo/Heme/Allergies: Does not bruise/bleed easily.  Psychiatric/Behavioral: Negative for depression and memory loss. The patient is not nervous/anxious and does not have insomnia.       Observations/Objective: Physical Exam  Constitutional: She is oriented to person, place, and time.  Neurological: She is alert and oriented to person, place, and time.  Psychiatric: Mood, memory, affect and judgment normal.   Patient reports morning stiffness for 10 minutes.   Patient denies nocturnal pain.  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  She takes arava 20 mg 1 tablet  daily for management of her rheumatoid arthritis. She tolerates Arava without any side effects. She has difficulty performing ADLs and is wheelchair bound due to joint contractures, joint stiffness, and severe pain in multiple joints.  She has been having more difficulty transitioning from her wheelchair to other seats/bed. She will be applying for chair lift to assist her since her husband can no longer lift her.  She continues to have severe pain in both feet and wears diabetic shoes. 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 She is not currently taking antibiotics, but she was advised to hold Arava until the ulcer has healed. She has an upcoming follow up visit with [place] on 07/04/19.   High risk medication use -Arava 20 mg 1 tablet daily. CBC and CMP were drawn on 03/15/19.  She is due to update lab work.  Standing orders were placed today. She previouslydiscontinued SSZ due to SE of diarrhea. She previously discontinued PLQ 200 mg BID M-F per ophthalmology recommendation.   Idiopathic chronic gout of multiple sites without tophus -She has not had any recent gout flares. She is taking allopurinol100 mg po daily.  Age-related osteoporosis without current pathological fracture -She is taking Fosamax 70 mg po once weeklyalong with a vitamin D supplement.Most recent DEXA on 04/11/2018 showed T-score of -3.4 at spine and negative 11% change in BMD at right hip compared to previous DEXA in October 2017.  Primary osteoarthritis of both knees -She has severe chronic pain in both knee joints due to severe osteoarthritis/rheuamtoid arthritis overlap.  She experiences intermittent joint swelling and severe joint stiffness in both knee joints.  She had bilateral visco injections in both knees on 05/08/19, but she did not notice much improvement.  She has flexion contractures in both knee joints and is not able to bear full weight on her  knees due to the discomfort.  She is wheelchair bound and has been unable to stand in over 1 year.  She is at high risk for a fall.  She has had increased difficulty transitioning from her wheelchair to bed due to her husband having a lot of difficulty assisting her.   Her grandson has been helping assist her but he will be moving soon and she is concerned about what she will do for assistance.  It is medically necessary that she receive a chair lift to assist her.   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    Other medical conditions are listed as follows:  History of kidney stones  History of hyperlipidemia  History of shingles   History of hypothyroidism   History of diabetes mellitus   Essential hypertension   Anemia, unspecified type  Follow Up Instructions: She will follow up in May 2021.    I discussed the assessment and treatment plan with the patient. The patient was provided an opportunity to ask questions and all were answered. The patient agreed with the plan and demonstrated an understanding of the instructions.   The patient was advised to call back or seek an in-person evaluation if the symptoms  worsen or if the condition fails to improve as anticipated.  I provided 30 minutes of non-face-to-face time during this encounter.   Bo Merino, MD  Scribed by- Hazel Sams, PA-C

## 2019-06-13 NOTE — Telephone Encounter (Signed)
Office note from today's virtual visit has been faxed to [facility] for [employer] at request of patient. (signed medical release on file).

## 2019-06-21 ENCOUNTER — Other Ambulatory Visit: Payer: Self-pay | Admitting: Rheumatology

## 2019-06-21 NOTE — Telephone Encounter (Signed)
ok 

## 2019-06-21 NOTE — Telephone Encounter (Signed)
Last Visit: 06/13/2019 telemedicine  Next Visit: 07/04/2019  Labs: 03/15/2019 Glucose is very elevated-252.Rest of CMP stable.  Attempted to contact patient and left message on machine to advise patient she is due to update labs.   Okay to refill 30 day supply of arava?

## 2019-06-22 ENCOUNTER — Encounter: Payer: Self-pay | Admitting: Rheumatology

## 2019-06-22 NOTE — Telephone Encounter (Signed)
Plaquenil previously discontinued due to ophthalmology recommendations. Patient then went to [facility] and PLQ Eye Exam: 04/26/19 [facility] Follow up in 1 year (per chart). Please advise.

## 2019-06-23 NOTE — Telephone Encounter (Signed)
Please request a copy from the [facility] to see if she had field of vision examination performed.  If it is normal then we can call in Plaquenil for her.  According to her daughter she did much better while she was on Plaquenil.

## 2019-06-29 NOTE — Progress Notes (Signed)
Office Visit Note  Patient: Mary [name]             Date of Birth: 10-Jun-1940           MRN: [id number]             PCP: Dema Severin, NP Referring: Dema Severin, NP Visit Date: 07/04/2019 Occupation: @GUAROCC @  Subjective:  Discussed medications  History of Present Illness: Mary [name] is a 79 y.o. female with history of seronegative rheumatoid arthritis, osteoporosis, and gout.  She takes Arava 20 mg 1 tablet by mouth daily and is tolerating arava without any side effects.  She presents today to discuss restarting on PLQ.  She continues to have chronic pain in both knee joints, especially the right knee joint. Her discomfort has bee severe. She is primarily wheelchair bound.  We are applying for a lift chair for assistance.  She denies any recent gout flares.  She is taking allopurinol 100 mg 1 tablet by mouth daily for management of gout. She continues take Fosamax 70 mg 1 tablet by mouth once weekly and vitamin D 5000 units on Mondays, Wednesdays, and Fridays for management of osteoporosis.  Activities of Daily Living:  Patient reports morning stiffness for 0 none.   Patient Reports nocturnal pain.  Difficulty dressing/grooming: Reports Difficulty climbing stairs: Reports Difficulty getting out of chair: Reports Difficulty using hands for taps, buttons, cutlery, and/or writing: Reports  Review of Systems  Constitutional: Negative for fatigue.  HENT: Positive for mouth dryness. Negative for mouth sores and nose dryness.   Eyes: Negative for pain, visual disturbance and dryness.  Respiratory: Negative for cough, hemoptysis, shortness of breath and difficulty breathing.   Cardiovascular: Positive for swelling in legs/feet. Negative for chest pain, palpitations and hypertension.  Gastrointestinal: Negative for blood in stool, constipation and diarrhea.  Endocrine: Negative for increased urination.  Genitourinary: Negative for difficulty urinating and painful urination.    Musculoskeletal: Positive for arthralgias, joint pain and joint swelling. Negative for myalgias, muscle weakness, morning stiffness, muscle tenderness and myalgias.  Skin: Negative for color change, pallor, rash, hair loss, nodules/bumps, skin tightness, ulcers and sensitivity to sunlight.  Allergic/Immunologic: Negative for susceptible to infections.  Neurological: Negative for dizziness, numbness, headaches and weakness.  Hematological: Negative for bruising/bleeding tendency and swollen glands.  Psychiatric/Behavioral: Negative for depressed mood and sleep disturbance. The patient is not nervous/anxious.     PMFS History:  Patient Active Problem List   Diagnosis Date Noted  . Mild persistent asthma without complication 11/18/2017  . Age-related osteoporosis without current pathological fracture 11/19/2016  . Acute respiratory failure (HCC)   . Rheumatoid lung (HCC)   . Hypoxia 09/03/2016  . Essential hypertension 09/03/2016  . Urinary tract infection 09/03/2016  . 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 High risk medication use 02/28/2016  . Idiopathic chronic gout of multiple sites without tophus 02/28/2016  . Primary osteoarthritis of both knees 02/28/2016  . Flexion contractures 02/28/2016  . History of hypothyroidism 02/28/2016  . History of diabetes mellitus 02/28/2016  . History of shingles 02/28/2016  . History of hyperlipidemia 02/28/2016    Past Medical History:  Diagnosis Date  . Complication of anesthesia   . Diabetes mellitus without complication (HCC)   . Dyspnea   . Gout   . High cholesterol   . History of kidney stones   . Hypertension   . Hypoxia 08/2016  . Osteoarthritis   . PONV (postoperative nausea and  vomiting)   . Rheumatoid arthritis (HCC)     Family History  Problem Relation Age of Onset  . Arthritis Brother   . Arthritis Sister   . Arthritis Sister   . Arthritis Brother   . Arthritis  Brother   . Arthritis Brother    Past Surgical History:  Procedure Laterality Date  . ABDOMINAL HYSTERECTOMY     Social History   Social History Narrative  . Not on file   Immunization History  Administered Date(s) Administered  . Fluad Quad(high Dose 65+) 12/01/2018  . Influenza, High Dose Seasonal PF 11/26/2015  . Influenza-Unspecified 12/07/2016  . Pneumococcal-Unspecified 09/04/2015     Objective: Vital Signs: BP (!) 148/94 (BP Location: Left Arm, Patient Position: Sitting, Cuff Size: Normal)   Pulse 80   Resp 18    Physical Exam Vitals and nursing note reviewed.  Constitutional:      Appearance: She is well-developed.  HENT:     Head: Normocephalic and atraumatic.  Eyes:     Conjunctiva/sclera: Conjunctivae normal.  Pulmonary:     Effort: Pulmonary effort is normal.  Abdominal:     General: Bowel sounds are normal.     Palpations: Abdomen is soft.  Musculoskeletal:     Cervical back: Normal range of motion.  Lymphadenopathy:     Cervical: No cervical adenopathy.  Skin:    General: Skin is warm and dry.     Capillary Refill: Capillary refill takes less than 2 seconds.  Neurological:     Mental Status: She is alert and oriented to person, place, and time.  Psychiatric:        Behavior: Behavior normal.      Musculoskeletal Exam:C-spine good ROM.  Bilateral shoulder forward flexion to about 110 degrees.  10 degree flexion contracture of both elbows.  Synovial thickening of both elbow joints.  Synovial thickening of both wrist joints. Subluxation of MCP joints noted. Contractures of PIPs.  No tenderness or synovitis of MCP joints.  Right hand edema noted. Right knee painful and limited extension of the right knee.  No warmth or effusion of the left knee joint.  Subluxation of both ankle joints.   CDAI Exam: CDAI Score: 2.6  Patient Global: 5 mm; Provider Global: 1 mm Swollen: 0 ; Tender: 2  Joint Exam 07/04/2019      Right  Left  Knee   Tender   Tender      Investigation: No additional findings.  Imaging: No results found.  Recent Labs: Lab Results  Component Value Date   WBC 7.7 03/15/2019   HGB 11.5 03/15/2019   PLT 245 03/15/2019   NA 136 03/15/2019   K 4.7 03/15/2019   CL 105 03/15/2019   CO2 19 (L) 03/15/2019   GLUCOSE 252 (H) 03/15/2019   BUN 22 03/15/2019   CREATININE 0.60 03/15/2019   BILITOT 0.3 03/15/2019   ALKPHOS 95 03/15/2019   AST 12 03/15/2019   ALT 8 03/15/2019   PROT 6.6 03/15/2019   ALBUMIN 4.0 03/15/2019   CALCIUM 9.6 03/15/2019   GFRAA 102 03/15/2019    Speciality Comments: Prior therapy: Plaquenil (d/c per opthamologist recommendation) and SSZ (diarrhea)  PLQ Eye Exam: 04/26/19 [facility] Follow up in 1 year  Procedures:  No procedures performed Allergies: Aspirin and Codeine    Assessment / Plan:     Visit Diagnoses: Rheumatoid arthritis of multiple sites with negative rheumatoid factor (HCC): She has no synovitis on exam today.  She has chronic pain  in both knee joints.  Limited and painful flexion and extension of the right knee joint noted on exam.  She is primarily wheelchair bound and is using a [employer] chairlift at home for assistance.  She is currently taking Arava 20 mg 1 tablet by mouth daily, and she would like to restart on Plaquenil 200 mg 1 tablet by mouth twice daily M-F.  She was cleared by Dr. Ernestine Mcmurray to restart on PLQ.  Indications, contraindications, and potential side effects were discussed.  All questions were addressed and consent was updated today.  A new prescription of PLQ will be sent to the pharmacy after lab work has resulted.  She will have labs drawn today then 1 month and every 3 months after starting on plaquenil.  She will continue taking arava as prescribed.  She was advised to notify us if she develops increased joint pain or joint swelling. She will follow up in 3 months.    Patient was counseled on the purpose, proper use, and adverse effects of  hydroxychloroquine including nausea/diarrhea, skin rash, headaches, and sun sensitivity.  Discussed importance of annual eye exams while on hydroxychloroquine to monitor to ocular toxicity and discussed importance of frequent laboratory monitoring.  Provided patient with eye exam form for baseline ophthalmologic exam.  Provided patient with educational materials on hydroxychloroquine and answered all questions.  Patient consented to hydroxychloroquine.  Will upload consent in the media tab.    Dose will be Plaquenil 200 mg twice daily Monday through Friday.  Prescription pending lab results.  High risk medication use -She is taking Arava 20 mg 1 tablet by mouth daily and she will be restarting on plaquenil 200 mg 1 tablet by mouth twice daily M-F. PLQ eye exam on 04/26/19.  Dr. Ernestine Mcmurray gave clearance for the patient to restart taking plaquenil.  CBC and CMP were drawn on 03/15/19.  She will require lab work today. She will have CBC and CMP drawn in 1 month then every 3 months to monitor for drug toxicity. Standing orders for CBC and CMP are in place.   She was advised to hold arava if she develops any signs or symptoms of an infection.  - Plan: CBC with Differential/Platelet, COMPLETE METABOLIC PANEL WITH GFR  Idiopathic chronic gout of multiple sites without tophus -She has not had any signs or symptoms of a gout flare recently.  She is clinically doing well on Allopurinol 100 mg po daily. Uric acid was 6.3 on 03/15/2019.  We discussed that ideally her uric acid level should be below 6.  She will continue taking allopurinol 100 mg 1 tablet by mouth daily.  She was advised to notify us if she develops signs or symptoms of a gout flare.  Age-related osteoporosis without current pathological fracture - Most recent DEXA on 04/11/2018 showed T-score of -3.4 at spine and negative 11% change in BMD at right hip compared to previous DEXA in October 2017. She is taking Fosamax 70 mg 1 tablet by mouth once weekly and  vitamin D 5000 units Mondays, Wednesdays, and Fridays for management of osteoporosis.  Primary osteoarthritis of both knees: Chronic pain.  She continues to have severe pain in the right knee joint.  She is primarily wheelchair bound.  She had a chair lift approved and states it has been very helpful at home.   Flexion contractures: She has 10 degree flexion contractures in both elbows.  Contractures of PIPs noted.   Other medical conditions are listed as follows:   History  of kidney stones  History of diabetes mellitus  History of hyperlipidemia  History of shingles  History of hypothyroidism  Essential hypertension  Orders: Orders Placed This Encounter  Procedures  . CBC with Differential/Platelet  . COMPLETE METABOLIC PANEL WITH GFR   No orders of the defined types were placed in this encounter.    Follow-Up Instructions: Return in about 3 months (around 10/04/2019) for Rheumatoid arthritis, Gout.   Mary Ales, PA-C  I examined and evaluated the patient with Mary Ales PA.  Patient has severe end-stage rheumatoid arthritis.  She does not have much synovitis.  Although she felt that she was doing much better while she was taking combination of Arava and Plaquenil.  Her most recent eye examination was normal.  We will restart her on Plaquenil.  If she has no response to Plaquenil over the 3 months then we may discontinue the medication.  Detailed counseling was provided.  The plan of care was discussed as noted above.  Pollyann Savoy, MD  Note - This record has been created using Animal nutritionist.  Chart creation errors have been sought, but may not always  have been located. Such creation errors do not reflect on  the standard of medical care.

## 2019-07-04 ENCOUNTER — Ambulatory Visit: Payer: Medicare HMO | Admitting: Rheumatology

## 2019-07-04 ENCOUNTER — Telehealth: Payer: Self-pay

## 2019-07-04 ENCOUNTER — Encounter: Payer: Self-pay | Admitting: Rheumatology

## 2019-07-04 ENCOUNTER — Other Ambulatory Visit: Payer: Self-pay

## 2019-07-04 VITALS — BP 148/94 | HR 80 | Resp 18

## 2019-07-04 DIAGNOSIS — M1A09X0 Idiopathic chronic gout, multiple sites, without tophus (tophi): Secondary | ICD-10-CM

## 2019-07-04 DIAGNOSIS — M2450 Contracture, unspecified joint: Secondary | ICD-10-CM

## 2019-07-04 DIAGNOSIS — I10 Essential (primary) hypertension: Secondary | ICD-10-CM

## 2019-07-04 DIAGNOSIS — M0609 Rheumatoid arthritis without rheumatoid factor, multiple sites: Secondary | ICD-10-CM | POA: Diagnosis not present

## 2019-07-04 DIAGNOSIS — M170 Bilateral primary osteoarthritis of knee: Secondary | ICD-10-CM

## 2019-07-04 DIAGNOSIS — Z79899 Other long term (current) drug therapy: Secondary | ICD-10-CM

## 2019-07-04 DIAGNOSIS — M810 Age-related osteoporosis without current pathological fracture: Secondary | ICD-10-CM

## 2019-07-04 DIAGNOSIS — Z8619 Personal history of other infectious and parasitic diseases: Secondary | ICD-10-CM

## 2019-07-04 DIAGNOSIS — Z8639 Personal history of other endocrine, nutritional and metabolic disease: Secondary | ICD-10-CM

## 2019-07-04 DIAGNOSIS — Z87442 Personal history of urinary calculi: Secondary | ICD-10-CM

## 2019-07-04 LAB — COMPLETE METABOLIC PANEL WITH GFR
AG Ratio: 1.3 (calc) (ref 1.0–2.5)
ALT: 6 U/L (ref 6–29)
AST: 11 U/L (ref 10–35)
Albumin: 3.8 g/dL (ref 3.6–5.1)
Alkaline phosphatase (APISO): 76 U/L (ref 37–153)
BUN: 19 mg/dL (ref 7–25)
CO2: 21 mmol/L (ref 20–32)
Calcium: 8.6 mg/dL (ref 8.6–10.4)
Chloride: 107 mmol/L (ref 98–110)
Creat: 0.7 mg/dL (ref 0.60–0.93)
GFR, Est African American: 97 mL/min/{1.73_m2} (ref >=60)
GFR, Est Non African American: 84 mL/min/{1.73_m2} (ref >=60)
Globulin: 2.9 g/dL (calc) (ref 1.9–3.7)
Glucose, Bld: 155 mg/dL — ABNORMAL HIGH (ref 65–99)
Potassium: 4.3 mmol/L (ref 3.5–5.3)
Sodium: 139 mmol/L (ref 135–146)
Total Bilirubin: 0.3 mg/dL (ref 0.2–1.2)
Total Protein: 6.7 g/dL (ref 6.1–8.1)

## 2019-07-04 LAB — CBC WITH DIFFERENTIAL/PLATELET
Absolute Monocytes: 650 cells/uL (ref 200–950)
Basophils Absolute: 111 cells/uL (ref 0–200)
Basophils Relative: 1.7 %
Eosinophils Absolute: 462 cells/uL (ref 15–500)
Eosinophils Relative: 7.1 %
HCT: 36.4 % (ref 35.0–45.0)
Hemoglobin: 11.3 g/dL — ABNORMAL LOW (ref 11.7–15.5)
Lymphs Abs: 1638 cells/uL (ref 850–3900)
MCH: 27.6 pg (ref 27.0–33.0)
MCHC: 31 g/dL — ABNORMAL LOW (ref 32.0–36.0)
MCV: 88.8 fL (ref 80.0–100.0)
MPV: 12.2 fL (ref 7.5–12.5)
Monocytes Relative: 10 %
Neutro Abs: 3640 cells/uL (ref 1500–7800)
Neutrophils Relative %: 56 %
Platelets: 231 10*3/uL (ref 140–400)
RBC: 4.1 10*6/uL (ref 3.80–5.10)
RDW: 15.7 % — ABNORMAL HIGH (ref 11.0–15.0)
Total Lymphocyte: 25.2 %
WBC: 6.5 10*3/uL (ref 3.8–10.8)

## 2019-07-04 NOTE — Telephone Encounter (Signed)
Per Ladona Ridgel, refill plaquenil once labs have resulted.

## 2019-07-04 NOTE — Patient Instructions (Signed)
Standing Labs We placed an order today for your standing lab work.    Please come back and get your standing labs in 1 month then every 3 months   We have open lab daily Monday through Thursday from 8:30-12:30 PM and 1:30-4:30 PM and Friday from 8:30-12:30 PM and 1:30-4:00 PM at the office of Dr. Pollyann Savoy.   You may experience shorter wait times on Monday and Friday afternoons. The office is located at [address], [place], [place] [address] No appointment is necessary.   Labs are drawn by [facility].  You may receive a bill from [facility] for your lab work.  If you wish to have your labs drawn at another location, please call the office 24 hours in advance to send orders.  If you have any questions regarding directions or hours of operation,  please call [phone].   Just as a reminder please drink plenty of water prior to coming for your lab work. Thanks!

## 2019-07-04 NOTE — Progress Notes (Signed)
Pharmacy Note  Subjective: Patient presents today to [facility] for follow up office visit.   Patient seen by the pharmacist for counseling on hydroxychloroquine rheumatoid arthritis.  She was on Plaquenil in the past and was discontinued due to suspected ocular toxicity.  No evidence of ocular toxicity per opthalmology and will resume Plaquenil.  Objective: CMP     Component Value Date/Time   NA 136 03/15/2019 0935   K 4.7 03/15/2019 0935   CL 105 03/15/2019 0935   CO2 19 (L) 03/15/2019 0935   GLUCOSE 252 (H) 03/15/2019 0935   GLUCOSE 125 (H) 04/18/2018 1208   BUN 22 03/15/2019 0935   CREATININE 0.60 03/15/2019 0935   CREATININE 0.50 (L) 04/18/2018 1208   CALCIUM 9.6 03/15/2019 0935   PROT 6.6 03/15/2019 0935   ALBUMIN 4.0 03/15/2019 0935   AST 12 03/15/2019 0935   ALT 8 03/15/2019 0935   ALKPHOS 95 03/15/2019 0935   BILITOT 0.3 03/15/2019 0935   GFRNONAA 89 03/15/2019 0935   GFRNONAA 95 04/18/2018 1208   GFRAA 102 03/15/2019 0935   GFRAA 110 04/18/2018 1208    CBC    Component Value Date/Time   WBC 7.7 03/15/2019 0935   WBC 6.3 04/18/2018 1208   RBC 3.97 03/15/2019 0935   RBC 3.76 (L) 04/18/2018 1208   HGB 11.5 03/15/2019 0935   HCT 35.2 03/15/2019 0935   PLT 245 03/15/2019 0935   MCV 89 03/15/2019 0935   MCH 29.0 03/15/2019 0935   MCH 28.5 04/18/2018 1208   MCHC 32.7 03/15/2019 0935   MCHC 31.7 (L) 04/18/2018 1208   RDW 15.2 03/15/2019 0935   LYMPHSABS 1.6 03/15/2019 0935   MONOABS 0.7 08/17/2017 1150   EOSABS 0.6 (H) 03/15/2019 0935   BASOSABS 0.1 03/15/2019 0935    Assessment/Plan: Patient was counseled on the purpose, proper use, and adverse effects of hydroxychloroquine including nausea/diarrhea, skin rash, headaches, and sun sensitivity.  Discussed importance of annual eye exams while on hydroxychloroquine to monitor to ocular toxicity and discussed importance of frequent laboratory monitoring.  Provided patient with eye exam form for  baseline ophthalmologic exam and standing lab instructions.  Provided patient with educational materials on hydroxychloroquine and answered all questions.  Patient consented to hydroxychloroquine.  Will upload consent in the media tab.     Dose will be Plaquenil 200 mg twice daily Monday through Friday.  Prescription pending lab results.  All questions encouraged and answered.  Instructed patient to call with any questions or concerns.   Verlin Fester, PharmD, [place], CPP Clinical Specialty Pharmacist [phone]  07/04/2019 11:52 AM

## 2019-07-05 MED ORDER — HYDROXYCHLOROQUINE SULFATE 200 MG PO TABS: ORAL_TABLET | ORAL | 0 refills | Status: DC

## 2019-07-05 NOTE — Progress Notes (Signed)
Hgb is mildly low-11.3 but stable.  Glucose is elevated-155.  Rest of CMP WNL.

## 2019-07-05 NOTE — Telephone Encounter (Signed)
Prescription sent to pharmacy.

## 2019-07-05 NOTE — Telephone Encounter (Signed)
Labs Resulted: Hgb is mildly low-11.3 but stable. Glucose is elevated-155. Rest of CMP WNL.    Per office note: Plaquenil 200 mg twice daily Monday through Friday

## 2019-07-18 ENCOUNTER — Other Ambulatory Visit: Payer: Self-pay | Admitting: Rheumatology

## 2019-07-18 NOTE — Telephone Encounter (Signed)
Last Visit: 07/04/2019 Next Visit: 10/04/2019 Labs: 07/04/2019 Hgb is mildly low-11.3 but stable.  Glucose is elevated-155. Rest of CMP WNL.  Okay to refill per Dr. Corliss Skains.

## 2019-08-02 ENCOUNTER — Other Ambulatory Visit: Payer: Self-pay | Admitting: Rheumatology

## 2019-08-02 NOTE — Telephone Encounter (Signed)
Last Visit: 07/04/2019 Next Visit: 10/04/2019 Labs: 07/04/2019 Hgb is mildly low-11.3 but stable. Glucose is elevated-155. Rest of CMP WNL.  PLQ Eye Exam: 04/26/19 WNL   Current Dose per office note 07/04/2019: Plaquenil 200 mg twice daily Monday through Friday  Okay to refill per Dr. Corliss Skains

## 2019-08-07 ENCOUNTER — Telehealth: Payer: Self-pay | Admitting: Rheumatology

## 2019-08-07 DIAGNOSIS — Z79899 Other long term (current) drug therapy: Secondary | ICD-10-CM

## 2019-08-07 NOTE — Telephone Encounter (Signed)
Lab Orders released.  

## 2019-08-07 NOTE — Telephone Encounter (Signed)
Patient called requesting her labwork orders be sent to [facility] in [place].  Patient states she will be going today, 08/07/19.

## 2019-08-08 LAB — CMP14+EGFR
ALT: 7 IU/L (ref 0–32)
AST: 12 IU/L (ref 0–40)
Albumin/Globulin Ratio: 1.3 (ref 1.2–2.2)
Albumin: 3.8 g/dL (ref 3.7–4.7)
Alkaline Phosphatase: 93 IU/L (ref 48–121)
BUN/Creatinine Ratio: 31 — ABNORMAL HIGH (ref 12–28)
BUN: 21 mg/dL (ref 8–27)
Bilirubin Total: <0.2 mg/dL (ref 0.0–1.2)
CO2: 15 mmol/L — ABNORMAL LOW (ref 20–29)
Calcium: 8.9 mg/dL (ref 8.7–10.3)
Chloride: 106 mmol/L (ref 96–106)
Creatinine, Ser: 0.68 mg/dL (ref 0.57–1.00)
GFR calc Af Amer: 98 mL/min/{1.73_m2} (ref >59)
GFR calc non Af Amer: 85 mL/min/{1.73_m2} (ref >59)
Globulin, Total: 3 g/dL (ref 1.5–4.5)
Glucose: 169 mg/dL — ABNORMAL HIGH (ref 65–99)
Potassium: 4.3 mmol/L (ref 3.5–5.2)
Sodium: 139 mmol/L (ref 134–144)
Total Protein: 6.8 g/dL (ref 6.0–8.5)

## 2019-08-08 LAB — CBC WITH DIFFERENTIAL/PLATELET
Basophils Absolute: 0.1 10*3/uL (ref 0.0–0.2)
Basos: 1 %
EOS (ABSOLUTE): 0.5 10*3/uL — ABNORMAL HIGH (ref 0.0–0.4)
Eos: 6 %
Hematocrit: 32.4 % — ABNORMAL LOW (ref 34.0–46.6)
Hemoglobin: 10.1 g/dL — ABNORMAL LOW (ref 11.1–15.9)
Immature Grans (Abs): 0 10*3/uL (ref 0.0–0.1)
Immature Granulocytes: 0 %
Lymphocytes Absolute: 1.5 10*3/uL (ref 0.7–3.1)
Lymphs: 17 %
MCH: 27.2 pg (ref 26.6–33.0)
MCHC: 31.2 g/dL — ABNORMAL LOW (ref 31.5–35.7)
MCV: 87 fL (ref 79–97)
Monocytes Absolute: 0.6 10*3/uL (ref 0.1–0.9)
Monocytes: 7 %
Neutrophils Absolute: 6.1 10*3/uL (ref 1.4–7.0)
Neutrophils: 69 %
Platelets: 265 10*3/uL (ref 150–450)
RBC: 3.72 x10E6/uL — ABNORMAL LOW (ref 3.77–5.28)
RDW: 16.7 % — ABNORMAL HIGH (ref 11.7–15.4)
WBC: 8.9 10*3/uL (ref 3.4–10.8)

## 2019-08-08 NOTE — Telephone Encounter (Signed)
RBC count, Hgb, and hct are low and trending down.  Please notify the patient and forward labs to PCP. She will continue to require frequent lab monitoring once a month.  CMP stable. Glucose is elevated-169.

## 2019-08-23 ENCOUNTER — Other Ambulatory Visit: Payer: Self-pay | Admitting: Rheumatology

## 2019-09-20 NOTE — Progress Notes (Deleted)
Office Visit Note  Patient: Mary [name]             Date of Birth: [birth year]           MRN: [id number]             PCP: Dema Severin, NP Referring: Dema Severin, NP Visit Date: 10/04/2019 Occupation: @GUAROCC @  Subjective:  No chief complaint on file.   History of Present Illness: Mary [name] is a 79 y.o. female ***   Activities of Daily Living:  Patient reports morning stiffness for *** {minute/hour:19697}.   Patient {ACTIONS;DENIES/REPORTS:21021675::"Denies"} nocturnal pain.  Difficulty dressing/grooming: {ACTIONS;DENIES/REPORTS:21021675::"Denies"} Difficulty climbing stairs: {ACTIONS;DENIES/REPORTS:21021675::"Denies"} Difficulty getting out of chair: {ACTIONS;DENIES/REPORTS:21021675::"Denies"} Difficulty using hands for taps, buttons, cutlery, and/or writing: {ACTIONS;DENIES/REPORTS:21021675::"Denies"}  No Rheumatology ROS completed.   PMFS History:  Patient Active Problem List   Diagnosis Date Noted  . Mild persistent asthma without complication 11/18/2017  . Age-related osteoporosis without current pathological fracture 11/19/2016  . Acute respiratory failure (HCC)   . Rheumatoid lung (HCC)   . Hypoxia 09/03/2016  . Essential hypertension 09/03/2016  . Urinary tract infection 09/03/2016  . 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 High risk medication use 02/28/2016  . Idiopathic chronic gout of multiple sites without tophus 02/28/2016  . Primary osteoarthritis of both knees 02/28/2016  . Flexion contractures 02/28/2016  . History of hypothyroidism 02/28/2016  . History of diabetes mellitus 02/28/2016  . History of shingles 02/28/2016  . History of hyperlipidemia 02/28/2016    Past Medical History:  Diagnosis Date  . Complication of anesthesia   . Diabetes mellitus without complication (HCC)   . Dyspnea   . Gout   . High cholesterol   . History of kidney stones   . Hypertension   . Hypoxia  08/2016  . Osteoarthritis   . PONV (postoperative nausea and vomiting)   . Rheumatoid arthritis (HCC)     Family History  Problem Relation Age of Onset  . Arthritis Brother   . Arthritis Sister   . Arthritis Sister   . Arthritis Brother   . Arthritis Brother   . Arthritis Brother    Past Surgical History:  Procedure Laterality Date  . ABDOMINAL HYSTERECTOMY     Social History   Social History Narrative  . Not on file   Immunization History  Administered Date(s) Administered  . Fluad Quad(high Dose 65+) 12/01/2018  . Influenza, High Dose Seasonal PF 11/26/2015  . Influenza-Unspecified 12/07/2016  . Pneumococcal-Unspecified 09/04/2015     Objective: Vital Signs: There were no vitals taken for this visit.   Physical Exam   Musculoskeletal Exam: ***  CDAI Exam: CDAI Score: -- Patient Global: --; Provider Global: -- Swollen: --; Tender: -- Joint Exam 10/04/2019   No joint exam has been documented for this visit   There is currently no information documented on the homunculus. Go to the Rheumatology activity and complete the homunculus joint exam.  Investigation: No additional findings.  Imaging: No results found.  Recent Labs: Lab Results  Component Value Date   WBC 8.9 08/07/2019   HGB 10.1 (L) 08/07/2019   PLT 265 08/07/2019   NA 139 08/07/2019   K 4.3 08/07/2019   CL 106 08/07/2019   CO2 15 (L) 08/07/2019   GLUCOSE 169 (H) 08/07/2019   BUN 21 08/07/2019   CREATININE 0.68 08/07/2019   BILITOT <0.2 08/07/2019   ALKPHOS 93 08/07/2019   AST 12 08/07/2019   ALT 7  08/07/2019   PROT 6.8 08/07/2019   ALBUMIN 3.8 08/07/2019   CALCIUM 8.9 08/07/2019   GFRAA 98 08/07/2019    Speciality Comments: Prior therapy: Plaquenil (d/c per opthamologist recommendation) and SSZ (diarrhea)  PLQ Eye Exam: 04/26/19 WNL [facility] Follow up in 1 year  Procedures:  No procedures performed Allergies: Aspirin and Codeine   Assessment / Plan:     Visit  Diagnoses: No diagnosis found.  Orders: No orders of the defined types were placed in this encounter.  No orders of the defined types were placed in this encounter.   Face-to-face time spent with patient was *** minutes. Greater than 50% of time was spent in counseling and coordination of care.  Follow-Up Instructions: No follow-ups on file.   Gearldine Bienenstock, PA-C  Note - This record has been created using Dragon software.  Chart creation errors have been sought, but may not always  have been located. Such creation errors do not reflect on  the standard of medical care.

## 2019-09-29 NOTE — Progress Notes (Signed)
Office Visit Note  Patient: Mary [name]             Date of Birth: 1940/04/30           MRN: [id number]             PCP: Imagene Riches, NP Referring: Imagene Riches, NP Visit Date: 10/10/2019 Occupation: _0 @  Subjective:  Right foot pain.   History of Present Illness: Mary [name] is a 79 y.o. female with severe rheumatoid arthritis, gout and osteoarthritis.  She states her rheumatoid arthritis is very well controlled with the combination of leflunomide and hydroxychloroquine.  She is having some discomfort in her right foot for which she is seeing a podiatrist.  She is getting a new shoe.  She also wants to get a motorized wheelchair as she is not able to get around with the wheelchair in her house.  Activities of Daily Living:  Patient reports morning stiffness for 0  minutes.   Patient Reports nocturnal pain.  Difficulty dressing/grooming: Reports Difficulty climbing stairs: Reports Difficulty getting out of chair: Reports Difficulty using hands for taps, buttons, cutlery, and/or writing: Reports  Review of Systems  Constitutional: Negative for fatigue.  HENT: Positive for mouth sores. Negative for mouth dryness and nose dryness.   Eyes: Negative for itching and dryness.  Respiratory: Positive for cough. Negative for shortness of breath and difficulty breathing.   Cardiovascular: Negative for chest pain and palpitations.  Gastrointestinal: Negative for blood in stool, constipation and diarrhea.  Endocrine: Negative for increased urination.  Genitourinary: Negative for difficulty urinating.  Musculoskeletal: Positive for arthralgias, joint pain, joint swelling and muscle tenderness. Negative for myalgias, morning stiffness and myalgias.  Skin: Negative for color change, rash and redness.  Allergic/Immunologic: Negative for susceptible to infections.  Neurological: Positive for numbness. Negative for dizziness, headaches, memory loss and weakness.  Hematological:  Negative for bruising/bleeding tendency.  Psychiatric/Behavioral: Negative for confusion.    PMFS History:  Patient Active Problem List   Diagnosis Date Noted  . Mild persistent asthma without complication 31/49/7026  . Age-related osteoporosis without current pathological fracture 11/19/2016  . Acute respiratory failure ([place])   . Rheumatoid lung ([place])   . Hypoxia 09/03/2016  . Essential hypertension 09/03/2016  . Urinary tract infection 09/03/2016  . Rheumatoid arthritis of multiple sites with negative rheumatoid factor ([place]) 02/28/2016  . High risk medication use 02/28/2016  . Idiopathic chronic gout of multiple sites without tophus 02/28/2016  . Primary osteoarthritis of both knees 02/28/2016  . Flexion contractures 02/28/2016  . History of hypothyroidism 02/28/2016  . History of diabetes mellitus 02/28/2016  . History of shingles 02/28/2016  . History of hyperlipidemia 02/28/2016    Past Medical History:  Diagnosis Date  . Complication of anesthesia   . Diabetes mellitus without complication ([place])   . Dyspnea   . Gout   . High cholesterol   . History of kidney stones   . Hypertension   . Hypoxia 08/2016  . Osteoarthritis   . PONV (postoperative nausea and vomiting)   . Rheumatoid arthritis ([place])     Family History  Problem Relation Age of Onset  . Arthritis Brother   . Arthritis Sister   . Arthritis Sister   . Arthritis Brother   . Arthritis Brother   . Arthritis Brother    Past Surgical History:  Procedure Laterality Date  . ABDOMINAL HYSTERECTOMY     Social History   Social History Narrative  . Not on file  Immunization History  Administered Date(s) Administered  . Fluad Quad(high Dose 65+) 12/01/2018  . Influenza, High Dose Seasonal PF 11/26/2015  . Influenza-Unspecified 12/07/2016  . PFIZER SARS-COV-2 Vaccination 04/07/2019, 04/28/2019  . Pneumococcal-Unspecified 09/04/2015     Objective: Vital Signs: BP 137/79 (BP Location: Left Arm, Patient  Position: Sitting, Cuff Size: Normal)   Pulse 81   Resp 16   Wt 183 lb (83 kg) Comment: per patient, patient in wheelchair  BMI 35.74 kg/m    Physical Exam Vitals and nursing note (Wheelchair-bound) reviewed.  Constitutional:      Appearance: She is well-developed.  HENT:     Head: Normocephalic and atraumatic.  Eyes:     Conjunctiva/sclera: Conjunctivae normal.  Cardiovascular:     Rate and Rhythm: Normal rate and regular rhythm.     Heart sounds: Normal heart sounds.  Pulmonary:     Effort: Pulmonary effort is normal.     Breath sounds: Normal breath sounds.  Abdominal:     General: Bowel sounds are normal.     Palpations: Abdomen is soft.  Musculoskeletal:     Cervical back: Normal range of motion.  Lymphadenopathy:     Cervical: No cervical adenopathy.  Skin:    General: Skin is warm and dry.     Capillary Refill: Capillary refill takes less than 2 seconds.  Neurological:     Mental Status: She is alert and oriented to person, place, and time.  Psychiatric:        Behavior: Behavior normal.      Musculoskeletal Exam: Patient is in wheelchair.  She has limited range of motion of her cervical spine.  Bilateral shoulder joint abduction was about her and 20 degrees.  She had good range of motion of elbow joints.  She has almost no range of motion in her wrist joints.  She has contractures in her bilateral hands.  She has very limited extension and flexion of her fingers.  She has bilateral MCP subluxation and ulnar deviation.  Hip joints were difficult to assess in the sitting position.  She knee joints have limited painful range of motion.  She had no tenderness over ankles.  She had tenderness over MTPs of her right foot.  No synovitis was noted.  CDAI Exam: CDAI Score: -- Patient Global: --; Provider Global: -- Swollen: --; Tender: -- Joint Exam 10/10/2019   No joint exam has been documented for this visit   There is currently no information documented on the  homunculus. Go to the Rheumatology activity and complete the homunculus joint exam.  Investigation: No additional findings.  Imaging: No results found.  Recent Labs: Lab Results  Component Value Date   WBC 8.9 08/07/2019   HGB 10.1 (L) 08/07/2019   PLT 265 08/07/2019   NA 139 08/07/2019   K 4.3 08/07/2019   CL 106 08/07/2019   CO2 15 (L) 08/07/2019   GLUCOSE 169 (H) 08/07/2019   BUN 21 08/07/2019   CREATININE 0.68 08/07/2019   BILITOT <0.2 08/07/2019   ALKPHOS 93 08/07/2019   AST 12 08/07/2019   ALT 7 08/07/2019   PROT 6.8 08/07/2019   ALBUMIN 3.8 08/07/2019   CALCIUM 8.9 08/07/2019   GFRAA 98 08/07/2019    Speciality Comments: Prior therapy: Plaquenil (d/c per opthamologist recommendation) and SSZ (diarrhea)  PLQ Eye Exam: 04/26/19 WNL [facility] Follow up in 1 year  Procedures:  No procedures performed Allergies: Aspirin and Codeine   Assessment / Plan:     Visit  Diagnoses: Rheumatoid arthritis of multiple sites with negative rheumatoid factor (HCC)-she has severe end-stage rheumatoid arthritis.  Patient states that her symptoms have been much better since she has been on Plaquenil and Arava combination.  She had multiple contractures but not much synovitis on examination today.  High risk medication use - Arava 20 mg 1 tablet by mouth daily and plaquenil 200 mg 1 tablet by mouth twice daily M-F. PLQ Eye Exam: 04/26/19 [place].  She gets her labs in [place].  We will send orders for labs again.  Idiopathic chronic gout of multiple sites without tophus - allopurinol 100 mg 1 tablet by mouth daily.  Uric acid was 6.3 on 03/15/2019.  Her uric acid is in desirable range.  She has not had any gout flare.  Age-related osteoporosis without current pathological fracture - Fosamax. DEXA on 04/11/2018 showed T-score of -3.4 at spine and negative 11% change in BMD at right hip compared to previous DEXA in October 2017  Primary osteoarthritis of  both knees-she has severe end-stage rheumatoid arthritis and osteoarthritis.  Wheelchair bound-she is planning to get a motorized wheelchair so she can get around in her house.  She has been limited to 1 bedroom as she cannot push her wheelchair.  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  Contractures of PIPs noted.   Other medical problems are listed as follows:  History of kidney stones  History of hypothyroidism  History of hyperlipidemia  History of diabetes mellitus  History of shingles  Essential hypertension  She is fully vaccinated against COVID-19.  I have advised her to get booster once available to her.  I have also discussed to continue to wear a mask, practice social distancing and hand hygiene.  If she develops COVID-19 infection she may be candidate for monoclonal antibody infusion.  Orders: Orders Placed This Encounter  Procedures  . CBC with Differential/Platelet  . Uric acid  . CMP14+EGFR   No orders of the defined types were placed in this encounter.    Follow-Up Instructions: Return in about 5 months (around 03/11/2020) for Rheumatoid arthritis.   Bo Merino, MD  Note - This record has been created using Editor, commissioning.  Chart creation errors have been sought, but may not always  have been located. Such creation errors do not reflect on  the standard of medical care.

## 2019-10-04 ENCOUNTER — Ambulatory Visit: Payer: Medicare HMO | Admitting: Rheumatology

## 2019-10-10 ENCOUNTER — Ambulatory Visit: Payer: Medicare HMO | Admitting: Rheumatology

## 2019-10-10 ENCOUNTER — Other Ambulatory Visit: Payer: Self-pay

## 2019-10-10 ENCOUNTER — Encounter: Payer: Self-pay | Admitting: Rheumatology

## 2019-10-10 VITALS — BP 137/79 | HR 81 | Resp 16 | Wt 183.0 lb

## 2019-10-10 DIAGNOSIS — M2450 Contracture, unspecified joint: Secondary | ICD-10-CM

## 2019-10-10 DIAGNOSIS — M170 Bilateral primary osteoarthritis of knee: Secondary | ICD-10-CM

## 2019-10-10 DIAGNOSIS — M810 Age-related osteoporosis without current pathological fracture: Secondary | ICD-10-CM | POA: Diagnosis not present

## 2019-10-10 DIAGNOSIS — I10 Essential (primary) hypertension: Secondary | ICD-10-CM

## 2019-10-10 DIAGNOSIS — Z79899 Other long term (current) drug therapy: Secondary | ICD-10-CM | POA: Diagnosis not present

## 2019-10-10 DIAGNOSIS — M0609 Rheumatoid arthritis without rheumatoid factor, multiple sites: Secondary | ICD-10-CM

## 2019-10-10 DIAGNOSIS — Z993 Dependence on wheelchair: Secondary | ICD-10-CM

## 2019-10-10 DIAGNOSIS — Z8639 Personal history of other endocrine, nutritional and metabolic disease: Secondary | ICD-10-CM

## 2019-10-10 DIAGNOSIS — M1A09X0 Idiopathic chronic gout, multiple sites, without tophus (tophi): Secondary | ICD-10-CM

## 2019-10-10 DIAGNOSIS — Z87442 Personal history of urinary calculi: Secondary | ICD-10-CM

## 2019-10-10 DIAGNOSIS — Z8619 Personal history of other infectious and parasitic diseases: Secondary | ICD-10-CM

## 2019-10-10 NOTE — Patient Instructions (Signed)
Standing Labs We placed an order today for your standing lab work.   Please have your standing labs drawn in September and every 3 months  If possible, please have your labs drawn 2 weeks prior to your appointment so that the provider can discuss your results at your appointment.  We have open lab daily Monday through Thursday from 8:30-12:30 PM and 1:30-4:30 PM and Friday from 8:30-12:30 PM and 1:30-4:00 PM at the office of Dr. Bethaney Oshana, [facility].   Please be advised, patients with office appointments requiring lab work will take precedents over walk-in lab work.  If possible, please come for your lab work on Monday and Friday afternoons, as you may experience shorter wait times. The office is located at [address], [address], [place], [place] [address] No appointment is necessary.   Labs are drawn by [facility]. Please bring your co-pay at the time of your lab draw.  You may receive a bill from [facility] for your lab work.  If you wish to have your labs drawn at another location, please call the office 24 hours in advance to send orders.  If you have any questions regarding directions or hours of operation,  please call [phone].   As a reminder, please drink plenty of water prior to coming for your lab work. Thanks!  

## 2019-10-17 ENCOUNTER — Other Ambulatory Visit: Payer: Self-pay | Admitting: Rheumatology

## 2019-10-17 NOTE — Telephone Encounter (Signed)
Last Visit: 10/10/2019 Next Visit: 03/14/2020 Labs: 08/07/2019 RBC count, Hgb, and hct are low and trending down. CMP stable. Glucose is elevated-169.   Current Dose per office note 10/10/2019: Arava 20 mg 1 tablet by mouth daily  DX: Rheumatoid arthritis of multiple sites with negative rheumatoid factor   Okay to refill Arava?

## 2019-10-17 NOTE — Telephone Encounter (Addendum)
Last Visit: 10/10/2019 Next Visit: 03/14/2020 Labs: 08/07/2019 RBC count, Hgb, and hct are low and trending down. CMP stable. Glucose is elevated-169.   Current Dose per office note 10/10/2019: not documented/   DX: Age-related osteoporosis without current pathological fracture   Okay to refill Fosamax?

## 2019-10-25 ENCOUNTER — Other Ambulatory Visit: Payer: Self-pay | Admitting: Rheumatology

## 2019-10-25 NOTE — Telephone Encounter (Signed)
Last Visit:10/10/2019 Next Visit:03/14/2020 Labs:6/7/2021RBC count, Hgb, and hct are low and trending down. CMP stable. Glucose is elevated-169.  PLQ Eye Exam: 04/26/19 WNL   Current Dose per office note on 10/10/2019: plaquenil 200 mg 1 tablet by mouth twice daily M-F.   Okay to refill PLQ?

## 2019-11-27 ENCOUNTER — Telehealth: Payer: Self-pay

## 2019-11-27 DIAGNOSIS — Z79899 Other long term (current) drug therapy: Secondary | ICD-10-CM

## 2019-11-27 DIAGNOSIS — M1A09X0 Idiopathic chronic gout, multiple sites, without tophus (tophi): Secondary | ICD-10-CM

## 2019-11-27 NOTE — Telephone Encounter (Signed)
Patient called requesting labwork orders be faxed to [facility] in [place].  Patient states she will be going tomorrow morning when they open at 8:00 am.

## 2019-11-27 NOTE — Telephone Encounter (Signed)
Lab Orders released.  

## 2019-11-29 LAB — CBC WITH DIFFERENTIAL/PLATELET
Basophils Absolute: 0.1 10*3/uL (ref 0.0–0.2)
Basos: 1 %
EOS (ABSOLUTE): 0.6 10*3/uL — ABNORMAL HIGH (ref 0.0–0.4)
Eos: 7 %
Hematocrit: 30.6 % — ABNORMAL LOW (ref 34.0–46.6)
Hemoglobin: 9.5 g/dL — ABNORMAL LOW (ref 11.1–15.9)
Immature Grans (Abs): 0 10*3/uL (ref 0.0–0.1)
Immature Granulocytes: 0 %
Lymphocytes Absolute: 1.4 10*3/uL (ref 0.7–3.1)
Lymphs: 18 %
MCH: 28.4 pg (ref 26.6–33.0)
MCHC: 31 g/dL — ABNORMAL LOW (ref 31.5–35.7)
MCV: 91 fL (ref 79–97)
Monocytes Absolute: 0.5 10*3/uL (ref 0.1–0.9)
Monocytes: 6 %
Neutrophils Absolute: 5.1 10*3/uL (ref 1.4–7.0)
Neutrophils: 68 %
Platelets: 237 10*3/uL (ref 150–450)
RBC: 3.35 x10E6/uL — ABNORMAL LOW (ref 3.77–5.28)
RDW: 15.4 % (ref 11.7–15.4)
WBC: 7.6 10*3/uL (ref 3.4–10.8)

## 2019-11-29 LAB — URIC ACID: Uric Acid: 5.2 mg/dL (ref 3.1–7.9)

## 2019-11-29 LAB — CMP14+EGFR
ALT: 6 IU/L (ref 0–32)
AST: 12 IU/L (ref 0–40)
Albumin/Globulin Ratio: 1.3 (ref 1.2–2.2)
Albumin: 3.9 g/dL (ref 3.7–4.7)
Alkaline Phosphatase: 90 IU/L (ref 44–121)
BUN/Creatinine Ratio: 28 (ref 12–28)
BUN: 17 mg/dL (ref 8–27)
Bilirubin Total: 0.2 mg/dL (ref 0.0–1.2)
CO2: 17 mmol/L — ABNORMAL LOW (ref 20–29)
Calcium: 9.2 mg/dL (ref 8.7–10.3)
Chloride: 110 mmol/L — ABNORMAL HIGH (ref 96–106)
Creatinine, Ser: 0.6 mg/dL (ref 0.57–1.00)
GFR calc Af Amer: 102 mL/min/{1.73_m2} (ref >59)
GFR calc non Af Amer: 88 mL/min/{1.73_m2} (ref >59)
Globulin, Total: 3 g/dL (ref 1.5–4.5)
Glucose: 119 mg/dL — ABNORMAL HIGH (ref 65–99)
Potassium: 4.6 mmol/L (ref 3.5–5.2)
Sodium: 143 mmol/L (ref 134–144)
Total Protein: 6.9 g/dL (ref 6.0–8.5)

## 2019-11-29 NOTE — Telephone Encounter (Signed)
Hemoglobin has decreased.  Patient should see her PCP for evaluation of anemia.  CMP is normal.  Uric acid is in desirable range.

## 2019-12-11 ENCOUNTER — Other Ambulatory Visit: Payer: Self-pay | Admitting: Physician Assistant

## 2019-12-11 ENCOUNTER — Other Ambulatory Visit: Payer: Self-pay | Admitting: Pulmonary Disease

## 2019-12-11 DIAGNOSIS — J849 Interstitial pulmonary disease, unspecified: Secondary | ICD-10-CM

## 2020-01-10 ENCOUNTER — Other Ambulatory Visit: Payer: Self-pay | Admitting: Physician Assistant

## 2020-01-10 NOTE — Telephone Encounter (Signed)
Last Visit: 10/10/2019 Next Visit: 03/14/2020 Labs: 11/28/2019 Hemoglobin has decreased CMP is normal. Uric acid is in desirable range.  Current Dose per office note 10/10/2019: not documented/  Arava 20 mg 1 tablet by mouth daily DX: Age-related osteoporosis without current pathological fracture  Rheumatoid arthritis of multiple sites with negative rheumatoid factor  Okay to refill Arava and Fosamax?

## 2020-01-22 ENCOUNTER — Other Ambulatory Visit: Payer: Self-pay | Admitting: Physician Assistant

## 2020-01-22 NOTE — Telephone Encounter (Signed)
Last Visit:10/10/2019 Next Visit:03/14/2020 Labs:11/28/2019 Hemoglobin has decreased CMP is normal. Uric acid is in desirable range. PLQ Eye Exam: 04/26/19 WNL  Current Dose per office note on 10/10/2019: plaquenil 200 mg 1 tablet by mouth twice daily M-F.   Okay to refill PLQ?

## 2020-02-23 ENCOUNTER — Other Ambulatory Visit: Payer: Self-pay | Admitting: Physician Assistant

## 2020-02-26 NOTE — Telephone Encounter (Signed)
Last Visit: 10/10/2019 Next Visit: 03/14/2020 Labs: 11/28/2019 Hemoglobin has decreased. Patient should see her PCP for evaluation of anemia. CMP is normal. Uric acid is in desirable range. Eye exam: 04/26/19 WNL  Current Dose per office note 10/10/2019: Plaquenil 200 mg 1 tablet by mouth twice daily M-F DX: Rheumatoid arthritis of multiple sites with negative rheumatoid factor  Okay to refill Plaquenil?

## 2020-02-29 NOTE — Progress Notes (Signed)
Office Visit Note  Patient: Mary [name]             Date of Birth: April 23, 1940           MRN: [id number]             PCP: Imagene Riches, NP Referring: Imagene Riches, NP Visit Date: 03/14/2020 Occupation: _0 @  Subjective:  Medication management   History of Present Illness: Willer Osorno is a 79 y.o. female with rheumatoid arthritis.  She was accompanied by her daughter today.  She states her rheumatoid arthritis is very well controlled on current medications.  She states she continues to have some discomfort in her right knee joint but none of the joints are swollen.  She has been tolerating medication well.  She denies having any gout flare.  She has been also taking Fosamax on a weekly basis without any concerns.  Activities of Daily Living:  Patient reports morning stiffness for 0 minutes.   Patient Reports nocturnal pain.  Difficulty dressing/grooming: Reports Difficulty climbing stairs: Reports Difficulty getting out of chair: Reports Difficulty using hands for taps, buttons, cutlery, and/or writing: Reports  Review of Systems  Constitutional: Negative for fatigue.  HENT: Negative for mouth sores, mouth dryness and nose dryness.   Eyes: Positive for itching. Negative for pain, visual disturbance and dryness.  Respiratory: Negative for cough, hemoptysis, shortness of breath and difficulty breathing.   Cardiovascular: Negative for chest pain, palpitations and swelling in legs/feet.  Gastrointestinal: Negative for abdominal pain, blood in stool, constipation and diarrhea.  Endocrine: Negative for increased urination.  Genitourinary: Negative for painful urination.  Musculoskeletal: Positive for arthralgias and joint pain. Negative for joint swelling, myalgias, muscle weakness, morning stiffness, muscle tenderness and myalgias.  Skin: Negative for color change, rash and redness.  Allergic/Immunologic: Negative for susceptible to infections.  Neurological: Negative  for dizziness, numbness, headaches, memory loss and weakness.  Hematological: Negative for swollen glands.  Psychiatric/Behavioral: Negative for confusion and sleep disturbance.    PMFS History:  Patient Active Problem List   Diagnosis Date Noted  . Mild persistent asthma without complication 79/04/4095  . Age-related osteoporosis without current pathological fracture 11/19/2016  . Acute respiratory failure ([place])   . 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 Hypoxia 09/03/2016  . Essential hypertension 09/03/2016  . Urinary tract infection 09/03/2016  . Rheumatoid arthritis of multiple sites with negative rheumatoid factor ([place]) 02/28/2016  . High risk medication use 02/28/2016  . Idiopathic chronic gout of multiple sites without tophus 02/28/2016  . Primary osteoarthritis of both knees 02/28/2016  . Flexion contractures 02/28/2016  . History of hypothyroidism 02/28/2016  . History of diabetes mellitus 02/28/2016  . History of shingles 02/28/2016  . History of hyperlipidemia 02/28/2016    Past Medical History:  Diagnosis Date  . Complication of anesthesia   . Diabetes mellitus without complication ([place])   . Dyspnea   . Gout   . High cholesterol   . History of kidney stones   . Hypertension   . Hypoxia 08/2016  . Osteoarthritis   . PONV (postoperative nausea and vomiting)   . Rheumatoid arthritis ([place])     Family History  Problem Relation Age of Onset  . Arthritis Brother   . Arthritis Sister   . Arthritis Sister   . Arthritis Brother   . Arthritis Brother   . Arthritis Brother    Past Surgical History:  Procedure Laterality Date  . ABDOMINAL HYSTERECTOMY     Social History  Social History Narrative  . Not on file   Immunization History  Administered Date(s) Administered  . Fluad Quad(high Dose 65+) 12/01/2018  . Influenza, High Dose Seasonal PF 11/26/2015  . Influenza-Unspecified 12/07/2016  . PFIZER SARS-COV-2 Vaccination 04/07/2019, 04/28/2019  .  Pneumococcal-Unspecified 09/04/2015     Objective: Vital Signs: BP (!) 173/94 (BP Location: Left Arm, Patient Position: Sitting, Cuff Size: Normal)   Pulse 77   Ht _0  (1.651 m)   Wt 140 lb (63.5 kg) Comment: Patient stated  BMI 23.30 kg/m    Physical Exam Vitals and nursing note reviewed.  Constitutional:      Appearance: She is well-developed and well-nourished.  HENT:     Head: Normocephalic and atraumatic.  Eyes:     Extraocular Movements: EOM normal.     Conjunctiva/sclera: Conjunctivae normal.  Cardiovascular:     Rate and Rhythm: Normal rate and regular rhythm.     Pulses: Intact distal pulses.     Heart sounds: Normal heart sounds.  Pulmonary:     Effort: Pulmonary effort is normal.     Breath sounds: Normal breath sounds.  Abdominal:     General: Bowel sounds are normal.     Palpations: Abdomen is soft.  Musculoskeletal:     Cervical back: Normal range of motion.  Lymphadenopathy:     Cervical: No cervical adenopathy.  Skin:    General: Skin is warm and dry.     Capillary Refill: Capillary refill takes less than 2 seconds.  Neurological:     Mental Status: She is alert and oriented to person, place, and time.  Psychiatric:        Mood and Affect: Mood and affect normal.        Behavior: Behavior normal.      Musculoskeletal Exam: Range of motion of her cervical spine.  She had good range of motion of her shoulder joints.  Elbow joints have limited range of motion.  She has very limited range of motion of bilateral wrist joints with synovial thickening.  She has bilateral MCP subluxation and contractures.  She has very limited extension and flexion in her MCP joints.  Hip joints were in fairly good range of motion which were difficult to assess in the wheelchair.  She has contractures in her bilateral knee joints without any synovitis.  She had no tenderness over ankles or MTPs.  CDAI Exam: CDAI Score: 0.2  Patient Global: 0 mm; Provider Global: 2  mm Swollen: 0 ; Tender: 0  Joint Exam 03/14/2020   No joint exam has been documented for this visit   There is currently no information documented on the homunculus. Go to the Rheumatology activity and complete the homunculus joint exam.  Investigation: No additional findings.  Imaging: No results found.  Recent Labs: Lab Results  Component Value Date   WBC 7.6 11/28/2019   HGB 9.5 (L) 11/28/2019   PLT 237 11/28/2019   NA 143 11/28/2019   K 4.6 11/28/2019   CL 110 (H) 11/28/2019   CO2 17 (L) 11/28/2019   GLUCOSE 119 (H) 11/28/2019   BUN 17 11/28/2019   CREATININE 0.60 11/28/2019   BILITOT 0.2 11/28/2019   ALKPHOS 90 11/28/2019   AST 12 11/28/2019   ALT 6 11/28/2019   PROT 6.9 11/28/2019   ALBUMIN 3.9 11/28/2019   CALCIUM 9.2 11/28/2019   GFRAA 102 11/28/2019    Speciality Comments: Prior therapy: Plaquenil (d/c per opthamologist recommendation) and SSZ (diarrhea)  PLQ Eye Exam: 04/26/19  [facility] Follow up in 1 year  Procedures:  No procedures performed Allergies: Aspirin and Codeine   Assessment / Plan:     Visit Diagnoses: Rheumatoid arthritis of multiple sites with negative rheumatoid factor ([place]) - she has severe end-stage rheumatoid arthritis with multiple contractures.  She is wheelchair-bound.  She states her arthritis is well controlled with the current medications.  High risk medication use - Arava 20 mg 1 tablet by mouth daily and plaquenil 200 mg 1 tablet by mouth twice daily M-F. PLQ Eye Exam: 04/26/19.  She was advised to get eye examination in February this year.  Her labs are past due.  She has been advised to get labs every 3 months.  She has chronic anemia for which she has been followed by her PCP.  She will get labs in [place] at [place].  Primary osteoarthritis of both knees-she has chronic discomfort in her knee joints.  She has contractures in her knees.  Flexion contractures - She has 10 degree flexion contractures in both  elbows.  Contractures of PIPs noted.   Idiopathic chronic gout of multiple sites without tophus - allopurinol 100 mg 1 tablet by mouth daily.  Uric acid was 6.3 on 03/15/2019.  Patient denies having any gout flares.  Her gout is managed by her PCP.  Age-related osteoporosis without current pathological fracture - Fosamax. DEXA on 04/11/2018 showed T-score of -3.4 at spine and negative 11% change in BMD at right hip compared to previous DEXA in October 2017.  We will schedule repeat DEXA.  Other medical problems are listed as follows:  Wheelchair bound  History of kidney stones  History of hyperlipidemia  History of hypothyroidism  History of diabetes mellitus  Essential hypertension  History of shingles  Educated but COVID-19 virus infection-patient is fully vaccinated against COVID-19.  She has not had a booster.  She was advised to get the booster.  Use of mask, social distancing and hand hygiene was discussed.  Orders: Orders Placed This Encounter  Procedures  . DG BONE DENSITY (DXA)  . CBC with Differential/Platelet  . CMP14+EGFR   No orders of the defined types were placed in this encounter.   Follow-Up Instructions: Return in about 5 months (around 08/12/2020) for Rheumatoid arthritis.   Bo Merino, MD  Note - This record has been created using Editor, commissioning.  Chart creation errors have been sought, but may not always  have been located. Such creation errors do not reflect on  the standard of medical care.

## 2020-03-14 ENCOUNTER — Ambulatory Visit: Payer: Medicare HMO | Admitting: Rheumatology

## 2020-03-14 ENCOUNTER — Encounter: Payer: Self-pay | Admitting: Rheumatology

## 2020-03-14 ENCOUNTER — Other Ambulatory Visit: Payer: Self-pay

## 2020-03-14 VITALS — BP 173/94 | HR 77 | Ht 65.0 in | Wt 140.0 lb

## 2020-03-14 DIAGNOSIS — Z87442 Personal history of urinary calculi: Secondary | ICD-10-CM

## 2020-03-14 DIAGNOSIS — M170 Bilateral primary osteoarthritis of knee: Secondary | ICD-10-CM | POA: Diagnosis not present

## 2020-03-14 DIAGNOSIS — Z8619 Personal history of other infectious and parasitic diseases: Secondary | ICD-10-CM

## 2020-03-14 DIAGNOSIS — M1A09X0 Idiopathic chronic gout, multiple sites, without tophus (tophi): Secondary | ICD-10-CM

## 2020-03-14 DIAGNOSIS — Z79899 Other long term (current) drug therapy: Secondary | ICD-10-CM

## 2020-03-14 DIAGNOSIS — M2450 Contracture, unspecified joint: Secondary | ICD-10-CM | POA: Diagnosis not present

## 2020-03-14 DIAGNOSIS — Z7189 Other specified counseling: Secondary | ICD-10-CM

## 2020-03-14 DIAGNOSIS — Z993 Dependence on wheelchair: Secondary | ICD-10-CM

## 2020-03-14 DIAGNOSIS — M0609 Rheumatoid arthritis without rheumatoid factor, multiple sites: Secondary | ICD-10-CM | POA: Diagnosis not present

## 2020-03-14 DIAGNOSIS — I10 Essential (primary) hypertension: Secondary | ICD-10-CM

## 2020-03-14 DIAGNOSIS — M810 Age-related osteoporosis without current pathological fracture: Secondary | ICD-10-CM

## 2020-03-14 DIAGNOSIS — Z8639 Personal history of other endocrine, nutritional and metabolic disease: Secondary | ICD-10-CM

## 2020-03-14 NOTE — Patient Instructions (Signed)
Standing Labs We placed an order today for your standing lab work.   Please have your standing labs drawn in April and every 3 months  Eye exam 04/2020  If possible, please have your labs drawn 2 weeks prior to your appointment so that the provider can discuss your results at your appointment.  We have open lab daily Monday through Thursday from 8:30-12:30 PM and 1:30-4:30 PM and Friday from 8:30-12:30 PM and 1:30-4:00 PM at the office of Dr. Pollyann Savoy, [facility].   Please be advised, patients with office appointments requiring lab work will take precedents over walk-in lab work.  If possible, please come for your lab work on Monday and Friday afternoons, as you may experience shorter wait times. The office is located at [address], [place], [place] [address] No appointment is necessary.   Labs are drawn by [facility]. Please bring your co-pay at the time of your lab draw.  You may receive a bill from [facility] for your lab work.  If you wish to have your labs drawn at another location, please call the office 24 hours in advance to send orders.  If you have any questions regarding directions or hours of operation,  please call [phone].   As a reminder, please drink plenty of water prior to coming for your lab work. Thanks!    COVID-19 vaccine recommendations:   COVID-19 vaccine is recommended for everyone (unless you are allergic to a vaccine component), even if you are on a medication that suppresses your immune system.   Do not take Tylenol or any anti-inflammatory medications (NSAIDs) 24 hours prior to the COVID-19 vaccination.   There is no direct evidence about the efficacy of the COVID-19 vaccine in individuals who are on medications that suppress the immune system.   Even if you are fully vaccinated, and you are on any medications that suppress your immune system, please continue to wear a mask, maintain at least six feet social distance and  practice hand hygiene.   If you develop a COVID-19 infection, please contact your PCP or our office to determine if you need monoclonal antibody infusion.  The booster vaccine is now available for immunocompromised patients.   Please see the following web sites for updated information.   [email or web address]

## 2020-03-25 ENCOUNTER — Other Ambulatory Visit: Payer: Self-pay | Admitting: *Deleted

## 2020-03-25 DIAGNOSIS — Z79899 Other long term (current) drug therapy: Secondary | ICD-10-CM

## 2020-03-25 NOTE — Addendum Note (Signed)
Addended by: Audrie Lia on: 03/25/2020 09:40 AM   Modules accepted: Orders

## 2020-03-27 LAB — CMP14+EGFR
ALT: 7 IU/L (ref 0–32)
AST: 13 IU/L (ref 0–40)
Albumin/Globulin Ratio: 1.3 (ref 1.2–2.2)
Albumin: 3.9 g/dL (ref 3.7–4.7)
Alkaline Phosphatase: 87 IU/L (ref 44–121)
BUN/Creatinine Ratio: 31 — ABNORMAL HIGH (ref 12–28)
BUN: 22 mg/dL (ref 8–27)
Bilirubin Total: 0.2 mg/dL (ref 0.0–1.2)
CO2: 20 mmol/L (ref 20–29)
Calcium: 9.5 mg/dL (ref 8.7–10.3)
Chloride: 104 mmol/L (ref 96–106)
Creatinine, Ser: 0.71 mg/dL (ref 0.57–1.00)
GFR calc Af Amer: 95 mL/min/1.73
GFR calc non Af Amer: 82 mL/min/1.73
Globulin, Total: 3.1 g/dL (ref 1.5–4.5)
Glucose: 185 mg/dL — ABNORMAL HIGH (ref 65–99)
Potassium: 4.3 mmol/L (ref 3.5–5.2)
Sodium: 138 mmol/L (ref 134–144)
Total Protein: 7 g/dL (ref 6.0–8.5)

## 2020-03-27 LAB — CBC WITH DIFFERENTIAL/PLATELET
Basophils Absolute: 0.1 10*3/uL (ref 0.0–0.2)
Basos: 1 %
EOS (ABSOLUTE): 0.7 10*3/uL — ABNORMAL HIGH (ref 0.0–0.4)
Eos: 10 %
Hematocrit: 31.8 % — ABNORMAL LOW (ref 34.0–46.6)
Hemoglobin: 9.7 g/dL — ABNORMAL LOW (ref 11.1–15.9)
Immature Grans (Abs): 0 10*3/uL (ref 0.0–0.1)
Immature Granulocytes: 0 %
Lymphocytes Absolute: 1.4 10*3/uL (ref 0.7–3.1)
Lymphs: 20 %
MCH: 28.2 pg (ref 26.6–33.0)
MCHC: 30.5 g/dL — ABNORMAL LOW (ref 31.5–35.7)
MCV: 92 fL (ref 79–97)
Monocytes Absolute: 0.4 10*3/uL (ref 0.1–0.9)
Monocytes: 6 %
Neutrophils Absolute: 4.3 10*3/uL (ref 1.4–7.0)
Neutrophils: 63 %
Platelets: 241 10*3/uL (ref 150–450)
RBC: 3.44 x10E6/uL — ABNORMAL LOW (ref 3.77–5.28)
RDW: 15.9 % — ABNORMAL HIGH (ref 11.7–15.4)
WBC: 6.8 10*3/uL (ref 3.4–10.8)

## 2020-03-27 NOTE — Progress Notes (Signed)
Glucose is elevated-185. BUN/creatinine ratio is mildly elevated but stable. Anemia is stable.  Please forward lab work to PCP.

## 2020-04-09 ENCOUNTER — Other Ambulatory Visit: Payer: Self-pay | Admitting: Physician Assistant

## 2020-04-09 NOTE — Telephone Encounter (Signed)
Last Visit: 03/14/2020 Next Visit: 08/15/2020 Labs: 03/26/2020, Glucose is elevated-185. BUN/creatinine ratio is mildly elevated but stable. Anemia is stable.   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

## 2020-04-10 ENCOUNTER — Telehealth: Payer: Self-pay

## 2020-04-10 MED ORDER — ALENDRONATE SODIUM 70 MG PO TABS: ORAL_TABLET | ORAL | 0 refills | Status: DC

## 2020-04-10 NOTE — Telephone Encounter (Signed)
Patient called requesting prescription refill of Alendronate to be sent to [facility].

## 2020-04-10 NOTE — Telephone Encounter (Signed)
Last Visit: 03/14/2020 Next Visit: 08/15/2020 Labs: 03/26/2020, Glucose is elevated-185. BUN/creatinine ratio is mildly elevated but stable. Anemia is stable. Please forward lab work to PCP.   Current Dose per office note 03/14/2020, Fosamax. DX: Age-related osteoporosis without current pathological fracture  Last Fill: 01/11/2020  Okay to refill Alendronate?

## 2020-05-02 LAB — HM DIABETES EYE EXAM

## 2020-06-18 ENCOUNTER — Encounter: Payer: Self-pay | Admitting: Rheumatology

## 2020-06-19 ENCOUNTER — Telehealth: Payer: Self-pay | Admitting: *Deleted

## 2020-06-19 NOTE — Telephone Encounter (Signed)
Received DEXA results from [facility].  Osteopenia  Date of Scan: 06/18/2020 Lowest T-score and lowest site measured: -2.3 right femoral neck, BMD 0.593 Significant changes in BMD and site measured (5% and above): 19% right total femur  Current Regimen: Fosamax, Calcium, Vitamin D and resistive exercises.  Recommendation: f/u 2 years  Reviewed by: Sherron Ales, PA-C  Next Appointment:

## 2020-07-03 ENCOUNTER — Other Ambulatory Visit: Payer: Self-pay | Admitting: Pulmonary Disease

## 2020-07-03 DIAGNOSIS — J849 Interstitial pulmonary disease, unspecified: Secondary | ICD-10-CM

## 2020-07-10 ENCOUNTER — Other Ambulatory Visit: Payer: Self-pay | Admitting: Physician Assistant

## 2020-07-10 NOTE — Telephone Encounter (Signed)
Next Visit: 08/15/2020  Last Visit: 03/14/2020  Last Fill: 04/09/2020  DX:  Rheumatoid arthritis of multiple sites with negative rheumatoid factor   Current Dose per office note 03/14/2020, Arava 20 mg 1 tablet by mouth daily  Labs: 03/26/2020, Glucose is elevated-185. BUN/creatinine ratio is mildly elevated but stable. Anemia is stable. Please forward lab work to PCP  I called patient, labs are due.  Okay to refill ARAVA?

## 2020-07-15 ENCOUNTER — Telehealth: Payer: Self-pay

## 2020-07-15 DIAGNOSIS — Z79899 Other long term (current) drug therapy: Secondary | ICD-10-CM

## 2020-07-15 DIAGNOSIS — M1A09X0 Idiopathic chronic gout, multiple sites, without tophus (tophi): Secondary | ICD-10-CM

## 2020-07-15 NOTE — Telephone Encounter (Signed)
Patient requested her labwork orders be sent to [facility] in [place].

## 2020-07-15 NOTE — Telephone Encounter (Signed)
Lab orders released for [facility].  

## 2020-07-18 LAB — CMP14+EGFR
ALT: 8 IU/L (ref 0–32)
AST: 9 IU/L (ref 0–40)
Albumin/Globulin Ratio: 1.2 (ref 1.2–2.2)
Albumin: 4.1 g/dL (ref 3.7–4.7)
Alkaline Phosphatase: 79 IU/L (ref 44–121)
BUN/Creatinine Ratio: 33 — ABNORMAL HIGH (ref 12–28)
BUN: 24 mg/dL (ref 8–27)
Bilirubin Total: 0.3 mg/dL (ref 0.0–1.2)
CO2: 17 mmol/L — ABNORMAL LOW (ref 20–29)
Calcium: 8.8 mg/dL (ref 8.7–10.3)
Chloride: 108 mmol/L — ABNORMAL HIGH (ref 96–106)
Creatinine, Ser: 0.72 mg/dL (ref 0.57–1.00)
Globulin, Total: 3.3 g/dL (ref 1.5–4.5)
Glucose: 145 mg/dL — ABNORMAL HIGH (ref 65–99)
Potassium: 4.6 mmol/L (ref 3.5–5.2)
Sodium: 140 mmol/L (ref 134–144)
Total Protein: 7.4 g/dL (ref 6.0–8.5)
eGFR: 86 mL/min/{1.73_m2} (ref >59)

## 2020-07-18 LAB — CBC WITH DIFFERENTIAL/PLATELET
Basophils Absolute: 0.1 10*3/uL (ref 0.0–0.2)
Basos: 2 %
EOS (ABSOLUTE): 0.9 10*3/uL — ABNORMAL HIGH (ref 0.0–0.4)
Eos: 11 %
Hematocrit: 28.4 % — ABNORMAL LOW (ref 34.0–46.6)
Hemoglobin: 9.1 g/dL — ABNORMAL LOW (ref 11.1–15.9)
Immature Grans (Abs): 0 10*3/uL (ref 0.0–0.1)
Immature Granulocytes: 0 %
Lymphocytes Absolute: 1.6 10*3/uL (ref 0.7–3.1)
Lymphs: 20 %
MCH: 30 pg (ref 26.6–33.0)
MCHC: 32 g/dL (ref 31.5–35.7)
MCV: 94 fL (ref 79–97)
Monocytes Absolute: 0.5 10*3/uL (ref 0.1–0.9)
Monocytes: 7 %
Neutrophils Absolute: 5 10*3/uL (ref 1.4–7.0)
Neutrophils: 60 %
Platelets: 229 10*3/uL (ref 150–450)
RBC: 3.03 x10E6/uL — ABNORMAL LOW (ref 3.77–5.28)
RDW: 15 % (ref 11.7–15.4)
WBC: 8.1 10*3/uL (ref 3.4–10.8)

## 2020-07-18 LAB — URIC ACID: Uric Acid: 6.2 mg/dL (ref 3.1–7.9)

## 2020-07-18 NOTE — Telephone Encounter (Signed)
Uric acid is 6.2 which is within normal limits although the goal is to keep uric acid below 6.0.  Please advise low purine diet-avoid red meat and shellfish.

## 2020-07-18 NOTE — Telephone Encounter (Signed)
Anemia persists with hemoglobin 9.1 glucose is elevated.  Please forward labs to her PCP.  Patient should discuss low hemoglobin with her PCP.

## 2020-07-22 ENCOUNTER — Other Ambulatory Visit: Payer: Self-pay | Admitting: Rheumatology

## 2020-07-22 NOTE — Telephone Encounter (Signed)
Last Visit: 03/14/2020 Next Visit: 08/15/2020 Labs: 07/17/2020, Anemia persists with hemoglobin 9.1 glucose is elevated. Please forward labs to her PCP. Patient should discuss low hemoglobin with her PCP. Eye exam: 04/26/2019 patient will call to have eye exam from 2022 faxed to office  Current Dose per office note 03/14/2020,  plaquenil 200 mg 1 tablet by mouth twice daily M-F. DX: Rheumatoid arthritis of multiple sites with negative rheumatoid factor   Last Fill: 02/26/2020  Okay to refill Plaquenil?

## 2020-08-01 NOTE — Progress Notes (Signed)
Office Visit Note  Patient: Mary [name]             Date of Birth: 11-12-1940           MRN: [id number]             PCP: Dema Severin, NP Referring: Dema Severin, NP Visit Date: 08/15/2020 Occupation: @GUAROCC @  Subjective:  Discuss DEXA   History of Present Illness: Mary [name] is a 80 y.o. female with history of seronegative rheumatoid arthritis, gout, osteoarthritis, and osteoporosis.  She is taking arava 20 mg 1 tablet by mouth daily and plaquenil 200 mg 1 tablet by mouth twice daily Monday through Friday only for management of RA. She denies any recent RA flares.  She has ongoing pain in both knee joints and the right wrist.  She feels warmth in the right knee on a daily basis.  She is primarily wheelchair bound.  She denies any recent falls or fractures.  She remains on fosamax 70 mg 1 tablet by mouth once weekly and vitamin D 5000 units three days a week for management of osteoporosis.  She requested to discuss recent DEXA results today.   She denies any recent gout flares.  She is taking allopurinol 100 mg 2 tablets daily for management of gout.  She denies any recent infections.     Activities of Daily Living:  Patient reports morning stiffness for a few minutes.   Patient Denies nocturnal pain.  Difficulty dressing/grooming: Reports Difficulty climbing stairs: Reports Difficulty getting out of chair: Reports Difficulty using hands for taps, buttons, cutlery, and/or writing: Reports  Review of Systems  Constitutional:  Negative for fatigue.  HENT:  Negative for mouth sores, mouth dryness and nose dryness.   Eyes:  Positive for itching. Negative for pain and dryness.  Respiratory:  Positive for shortness of breath, wheezing and difficulty breathing.   Cardiovascular:  Negative for chest pain and palpitations.  Gastrointestinal:  Negative for blood in stool, constipation and diarrhea.  Endocrine: Negative for increased urination.  Genitourinary:  Negative for  difficulty urinating.  Musculoskeletal:  Positive for joint pain, joint pain and joint swelling. Negative for myalgias, morning stiffness, muscle tenderness and myalgias.  Skin:  Negative for color change, rash and redness.  Allergic/Immunologic: Negative for susceptible to infections.  Neurological:  Negative for dizziness, numbness, headaches, memory loss and weakness.  Hematological:  Negative for bruising/bleeding tendency.  Psychiatric/Behavioral:  Negative for confusion.    PMFS History:  Patient Active Problem List   Diagnosis Date Noted   Mild persistent asthma without complication 11/18/2017   Age-related osteoporosis without current pathological fracture 11/19/2016   Acute respiratory failure (HCC)    Rheumatoid lung (HCC)    Hypoxia 09/03/2016   Essential hypertension 09/03/2016   Urinary tract infection 09/03/2016   Rheumatoid arthritis of multiple sites with negative rheumatoid factor (HCC) 02/28/2016   High risk medication use 02/28/2016   Idiopathic chronic gout of multiple sites without tophus 02/28/2016   Primary osteoarthritis of both knees 02/28/2016   Flexion contractures 02/28/2016   History of hypothyroidism 02/28/2016   History of diabetes mellitus 02/28/2016   History of shingles 02/28/2016   History of hyperlipidemia 02/28/2016    Past Medical History:  Diagnosis Date   Complication of anesthesia    Diabetes mellitus without complication (HCC)    Dyspnea    Gout    High cholesterol    History of kidney stones    Hypertension    Hypoxia 08/2016  Osteoarthritis    PONV (postoperative nausea and vomiting)    Rheumatoid arthritis (HCC)     Family History  Problem Relation Age of Onset   Arthritis Brother    Arthritis Sister    Arthritis Sister    Arthritis Brother    Arthritis Brother    Arthritis Brother    Past Surgical History:  Procedure Laterality Date   ABDOMINAL HYSTERECTOMY     Social History   Social History Narrative   Not on  file   Immunization History  Administered Date(s) Administered   Fluad Quad(high Dose 65+) 12/01/2018   Influenza, High Dose Seasonal PF 11/26/2015   Influenza-Unspecified 12/07/2016   PFIZER(Purple Top)SARS-COV-2 Vaccination 04/07/2019, 04/28/2019   Pneumococcal-Unspecified 09/04/2015     Objective: Vital Signs: BP (!) 153/92 (BP Location: Left Arm, Patient Position: Sitting, Cuff Size: Normal)   Pulse 90   Resp 17   Wt 150 lb (68 kg) Comment: per patient, patient in wheelchair  BMI 24.96 kg/m    Physical Exam Vitals and nursing note reviewed.  Constitutional:      Appearance: She is well-developed.  HENT:     Head: Normocephalic and atraumatic.  Eyes:     Conjunctiva/sclera: Conjunctivae normal.  Pulmonary:     Effort: Pulmonary effort is normal.  Abdominal:     Palpations: Abdomen is soft.  Musculoskeletal:     Cervical back: Normal range of motion.  Skin:    General: Skin is warm and dry.     Capillary Refill: Capillary refill takes less than 2 seconds.  Neurological:     Mental Status: She is alert and oriented to person, place, and time.  Psychiatric:        Behavior: Behavior normal.     Musculoskeletal Exam: Patient is wheelchair bound. C-spine has limited range of motion especially with lateral rotation to the right.  Thoracic kyphosis noted.  No midline spinal tenderness or SI joint tenderness noted.  Shoulder joints have good range of motion with some discomfort in the left shoulder.  Elbow joints have limited range of motion with no tenderness or inflammation.  Extremely limited range of motion of both wrist joints with synovial thickening.  Tenderness over the right wrist. MCP thickening and subluxation noted.  Incomplete fist formation.  Limited extension and flexion of MCP joints. Flexion contracture of both knees.  Flexion contracture of both knees. Warmth of right knee.  Pedal edema bilaterally.  Tenderness over the right ankle joint.  Patient wearing  diabetic shoes.  CDAI Exam: CDAI Score: -- Patient Global: --; Provider Global: -- Swollen: --; Tender: -- Joint Exam 08/15/2020   No joint exam has been documented for this visit   There is currently no information documented on the homunculus. Go to the Rheumatology activity and complete the homunculus joint exam.  Investigation: No additional findings.  Imaging: No results found.  Recent Labs: Lab Results  Component Value Date   WBC 8.1 07/17/2020   HGB 9.1 (L) 07/17/2020   PLT 229 07/17/2020   NA 140 07/17/2020   K 4.6 07/17/2020   CL 108 (H) 07/17/2020   CO2 17 (L) 07/17/2020   GLUCOSE 145 (H) 07/17/2020   BUN 24 07/17/2020   CREATININE 0.72 07/17/2020   BILITOT 0.3 07/17/2020   ALKPHOS 79 07/17/2020   AST 9 07/17/2020   ALT 8 07/17/2020   PROT 7.4 07/17/2020   ALBUMIN 4.1 07/17/2020   CALCIUM 8.8 07/17/2020   GFRAA 95 03/26/2020    Speciality Comments:  Prior therapy: Plaquenil (d/c per opthamologist recommendation) and SSZ (diarrhea) PLQ Eye Exam: 04/26/19 WNL [facility] [place] Follow up in 1 year Patient states she had PLQ Eye Exam in 2022, patient will have eye exam faxed to office  Procedures:  No procedures performed Allergies: Aspirin and Codeine     Assessment / Plan:     Visit Diagnoses: Rheumatoid arthritis of multiple sites with negative rheumatoid factor (HCC) - Severe end-stage rheumatoid arthritis with multiple contractures.  She is wheelchair-bound: Her rheumatoid arthritis has been well controlled on the current treatment regimen.  She has been taking Plaquenil 200 mg 1 tablet by mouth twice daily Monday through Friday and Arava 20 mg 1 tablet by mouth daily.  She has been tolerating both medications without any side effects and has not missed any doses recently.  She continues to have chronic pain in both knee joints and has flexion contractures bilaterally.  She is wheelchair-bound.  She has synovial thickening and extremely  limited range of motion of both wrist joints.  She has some tenderness over the right wrist on examination today.  No synovitis was noted.  She will remain on the current treatment regimen.  She was advised to notify us if she develops increased joint pain or joint swelling.  She will follow-up in the office in 5 months.  High risk medication use - Arava 20 mg 1 tablet by mouth daily and plaquenil 200 mg 1 tablet by mouth twice daily M-F. PLQ Eye Exam: 04/26/19.we will call to obtain most recent Plaquenil eye exam performed in 2022.  CBC and CMP updated on 07/17/20.  Her next lab work will be due in August and every 3 months.  Standing orders for CBC and CMP are in place.   She has not had any recent infections.  Advised to hold arava if she develops signs or symptoms of an infection and to resume once infection has completely cleared.  Primary osteoarthritis of both knees: Chronic pain.  She uses Voltaren gel topically as needed for pain relief.  Flexion contractures in both knees.  She is wheelchair-bound.  She has not had any recent falls.  Idiopathic chronic gout of multiple sites without tophus -She remains on allopurinol 200 mg by mouth daily for management of gout.  Uric acid was 6.2 on 07/17/20.  She has not had any recent gout flares.  She will remain on the current dose of allopurinol.  Flexion contractures: End stage rheumatoid arthritis. Flexion contracture ~10 degrees in both elbows.  Contractures of PIP joints in both hands.  Flexion contracture of both knees.    Age-related osteoporosis without current pathological fracture - DEXA on 04/11/2018 showed T-score of -3.4 AP spine and negative 11% change in BMD at right hip compared to previous DEXA in October 2017.  Most recent DEXA on 06/18/20 reviewed with the patient today in the office: Right femoral neck BMD 0.593 with T score -2.3.  Statistically significant increase in BMD in AP spine and right hip.  Left hip stable.  Right total femur +19%  change in BMD. She was initially started on Fosamax 70 mg 1 tablet by mouth once weekly in January 2018.  She has been tolerating Fosamax without any side effects.  She has been taking vitamin D 5000 units on Mondays, Wednesdays, and Fridays.  She has not had any recent falls or fractures.  She is wheelchair-bound. Discussed that she can take a drug-holiday from Fosamax until her next DEXA is due in  April 2024.  She will remain on vitamin D as recommended. She is in agreement with the plan.  Wheelchair bound: She has flexion contractures and chronic pain in both knee joints.    Other medical conditions are listed as follows:   History of kidney stones  History of hyperlipidemia  History of hypothyroidism  History of diabetes mellitus  Essential hypertension: Patient was advised to monitor blood pressure closely.   History of shingles  Orders: No orders of the defined types were placed in this encounter.  No orders of the defined types were placed in this encounter.    Follow-Up Instructions: Return in about 5 months (around 01/15/2021) for Rheumatoid arthritis, Osteoarthritis, Gout, Osteoporosis.   Gearldine Bienenstock, PA-C  Note - This record has been created using Dragon software.  Chart creation errors have been sought, but may not always  have been located. Such creation errors do not reflect on  the standard of medical care.

## 2020-08-15 ENCOUNTER — Encounter: Payer: Self-pay | Admitting: Physician Assistant

## 2020-08-15 ENCOUNTER — Other Ambulatory Visit: Payer: Self-pay

## 2020-08-15 ENCOUNTER — Telehealth: Payer: Self-pay

## 2020-08-15 ENCOUNTER — Ambulatory Visit: Payer: Medicare HMO | Admitting: Physician Assistant

## 2020-08-15 VITALS — BP 153/92 | HR 90 | Resp 17 | Wt 150.0 lb

## 2020-08-15 DIAGNOSIS — M1A09X0 Idiopathic chronic gout, multiple sites, without tophus (tophi): Secondary | ICD-10-CM

## 2020-08-15 DIAGNOSIS — Z87442 Personal history of urinary calculi: Secondary | ICD-10-CM

## 2020-08-15 DIAGNOSIS — M170 Bilateral primary osteoarthritis of knee: Secondary | ICD-10-CM | POA: Diagnosis not present

## 2020-08-15 DIAGNOSIS — M810 Age-related osteoporosis without current pathological fracture: Secondary | ICD-10-CM

## 2020-08-15 DIAGNOSIS — M0609 Rheumatoid arthritis without rheumatoid factor, multiple sites: Secondary | ICD-10-CM

## 2020-08-15 DIAGNOSIS — Z79899 Other long term (current) drug therapy: Secondary | ICD-10-CM

## 2020-08-15 DIAGNOSIS — Z993 Dependence on wheelchair: Secondary | ICD-10-CM

## 2020-08-15 DIAGNOSIS — Z8639 Personal history of other endocrine, nutritional and metabolic disease: Secondary | ICD-10-CM

## 2020-08-15 DIAGNOSIS — Z8619 Personal history of other infectious and parasitic diseases: Secondary | ICD-10-CM

## 2020-08-15 DIAGNOSIS — I10 Essential (primary) hypertension: Secondary | ICD-10-CM

## 2020-08-15 DIAGNOSIS — M2450 Contracture, unspecified joint: Secondary | ICD-10-CM

## 2020-08-15 NOTE — Patient Instructions (Signed)
Standing Labs We placed an order today for your standing lab work.   Please have your standing labs drawn in August and every 3 months   If possible, please have your labs drawn 2 weeks prior to your appointment so that the provider can discuss your results at your appointment.  Please note that you may see your imaging and lab results in MyChart before we have reviewed them. We may be awaiting multiple results to interpret others before contacting you. Please allow our office up to 72 hours to thoroughly review all of the results before contacting the office for clarification of your results.  We have open lab daily: Monday through Thursday from 1:30-4:30 PM and Friday from 1:30-4:00 PM at the office of Dr. Shaili Deveshwar, [facility].   Please be advised, all patients with office appointments requiring lab work will take precedent over walk-in lab work.  If possible, please come for your lab work on Monday and Friday afternoons, as you may experience shorter wait times. The office is located at [address], [address], [place], [place] [address] No appointment is necessary.   Labs are drawn by [facility]. Please bring your co-pay at the time of your lab draw.  You may receive a bill from [facility] for your lab work.  If you wish to have your labs drawn at another location, please call the office 24 hours in advance to send orders.  If you have any questions regarding directions or hours of operation,  please call [phone].   As a reminder, please drink plenty of water prior to coming for your lab work. Thanks!  

## 2020-08-15 NOTE — Telephone Encounter (Signed)
I spoke with [name] (patient's daughter) and advised the patient should discontinue Fosamax per Ladona Ridgel and Dr. Corliss Skains. Advised we will repeat DEXA scan in April 2024. Patient should continue vitamin D. [name] verbalized understanding and will let the patient know.   Fosamax has been discontinued off of the medication list.

## 2020-08-16 ENCOUNTER — Telehealth: Payer: Self-pay | Admitting: *Deleted

## 2020-08-16 NOTE — Telephone Encounter (Signed)
Received notes from [facility]. Their note states Mary [name] does not have pattern that is c/w adverse reaction to medication.  Due to inability of machines to lower to wheelchair height and patient unable to transfer, unable to use OCT and VF today.   Per Dr. Corliss Skains, contact patient and daughter to see if they would like to continue mediction without the proper testing.   Spoke with Mary [name] and she states they would like for Mary [name] to continue the PLQ.

## 2020-08-27 ENCOUNTER — Other Ambulatory Visit: Payer: Self-pay | Admitting: Pulmonary Disease

## 2020-08-27 DIAGNOSIS — J849 Interstitial pulmonary disease, unspecified: Secondary | ICD-10-CM

## 2020-10-09 ENCOUNTER — Other Ambulatory Visit: Payer: Self-pay | Admitting: Physician Assistant

## 2020-10-09 NOTE — Telephone Encounter (Signed)
Next Visit: 01/16/2021  Last Visit: 08/15/2020  Last Fill: 07/10/2020  DX: Rheumatoid arthritis of multiple sites with negative rheumatoid factor  Current Dose per office note 08/15/2020:  Arava 20 mg 1 tablet by mouth daily  Labs: 07/17/2020 Anemia persists with hemoglobin 9.1 glucose is elevated  Okay to refill Arava?

## 2020-10-17 ENCOUNTER — Other Ambulatory Visit: Payer: Self-pay | Admitting: Rheumatology

## 2020-10-17 ENCOUNTER — Ambulatory Visit: Payer: Medicare HMO | Admitting: Pulmonary Disease

## 2020-10-17 NOTE — Telephone Encounter (Signed)
Next Visit: 01/16/2021  Last Visit: 08/15/2020  Labs: 07/17/2020: Anemia persists with hemoglobin 9.1 glucose is elevated  Eye exam: 04/26/19 WNL  Received notes from [facility]. Their note states Macula does not have pattern that is c/w adverse reaction to medication.  Due to inability of machines to lower to wheelchair height and patient unable to transfer, unable to use OCT and VF today.  DX:  Rheumatoid arthritis of multiple sites with negative rheumatoid factor  Last Fill: 07/22/2020  Okay to refill Plaquenil?

## 2020-10-29 ENCOUNTER — Ambulatory Visit: Payer: Medicare HMO | Admitting: Pulmonary Disease

## 2020-10-29 ENCOUNTER — Other Ambulatory Visit: Payer: Self-pay

## 2020-10-29 ENCOUNTER — Encounter: Payer: Self-pay | Admitting: Pulmonary Disease

## 2020-10-29 VITALS — BP 140/82 | HR 94 | Temp 98.8°F | Ht 65.0 in

## 2020-10-29 DIAGNOSIS — J849 Interstitial pulmonary disease, unspecified: Secondary | ICD-10-CM

## 2020-10-29 NOTE — Patient Instructions (Signed)
I am glad you are doing well with regard to your breathing Continue the Arava per rheumatology We will get a high-resolution CT in 3 months and follow-up in clinic after that

## 2020-10-29 NOTE — Progress Notes (Signed)
Mary [name]    [id number]    Aug 14, 1940  Primary Care Physician:York, Orie Rout, NP  Referring Physician: Dema Severin, NP [address] [place],  [place] [address]  Chief complaint:   Follow up for RA-ILD  HPI: 80 year old with history of rheumatoid arthritis, diabetes, gout, hypertension, hyperlipidemia and hypothyroidism. She is being followed by Dr. Corliss Skains for management of rheumatoid arthritis. She had been previously maintained on methotrexate. She was taken off methotrexate with plans to transition to leflunomide. Before initiation of leflunomide she was hospitalized July 2018 with respiratory failure, bilateral lung infiltrates. CT at that time did not show any pulmonary embolus but there are diffuse ground glass opacities concerning for interstitial lung disease or edema.   She was started on prednisone and diuresed with improvement in symptoms. Bronchoscopy was considered but deferred due to improvement in respiratory status. She has been discharged on a prednisone taper and has followed up with Dr. Corliss Skains and is currently on [place]. Plaquenil stopped by rheumatology in June 2020 on recommendation of her ophthalmologist.  Interim History:. Here for follow-up after gap of over 1.5 years States that her breathing is doing well but does note some wheezing occasionally Continues on Arava per Dr. Corliss Skains  Outpatient Encounter Medications as of 10/29/2020  Medication Sig   acetaminophen (TYLENOL) 500 MG tablet as needed.    albuterol (PROVENTIL) (2.5 MG/3ML) 0.083% nebulizer solution Take 3 mLs (2.5 mg total) by nebulization every 6 (six) hours as needed for wheezing or shortness of breath.   allopurinol (ZYLOPRIM) 100 MG tablet Take 200 mg by mouth daily.    amLODipine (NORVASC) 2.5 MG tablet Take 2.5 mg by mouth daily.   benazepril (LOTENSIN) 20 MG tablet Take 1 tablet (20 mg total) by mouth daily.   carvedilol (COREG) 6.25 MG tablet    Cholecalciferol (VITAMIN  D3) 5000 units TABS Take 1 tablet by mouth. Takes on Monday, Wednesday, and Friday.   clorazepate (TRANXENE) 7.5 MG tablet Take 7.5 mg by mouth 2 (two) times daily.    hydroxychloroquine (PLAQUENIL) 200 MG tablet TAKE 1 TABLET BY MOUTH TWICE DAILY MONDAY THROUGH FRIDAY ONLY   ipratropium-albuterol (DUONEB) 0.5-2.5 (3) MG/3ML SOLN USE 1 VIAL VIA NEBULIZER 4 TIMES DAILY AS DIRECTED.   leflunomide (ARAVA) 20 MG tablet TAKE ONE (1) TABLET BY MOUTH EVERY DAY   levothyroxine (SYNTHROID, LEVOTHROID) 75 MCG tablet Take 75 mcg by mouth daily.    No facility-administered encounter medications on file as of 10/29/2020.   Physical Exam: Blood pressure 140/82, pulse 94, temperature 98.8 F (37.1 C), temperature source Oral, height 5\' 5"  (1.651 m), SpO2 93 %. Gen:      No acute distress HEENT:  EOMI, sclera anicteric Neck:     No masses; no thyromegaly Lungs:   Bibasal crackles CV:         Regular rate and rhythm; no murmurs Abd:      + bowel sounds; soft, non-tender; no palpable masses, no distension Ext:    No edema; adequate peripheral perfusion Skin:      Warm and dry; no rash Neuro: alert and oriented x 3 Psych: normal mood and affect   Data Reviewed: Imaging CTA chest 09/03/16- Negative for pulmonary embolus. Extensive bilateral ground-glass opacities, bilateral hilar and mediastinal lymph nodes. Calcific aortic and coronary atherosclerosis.  Hiatal hernia.  Chest x-ray 09/07/16-improvement in interstitial opacities. Chest x-ray 10/12/16-stable interstitial opacities Chest x-ray 08/17/17- mild stable cardiomegaly, no active lung disease. 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 Continues on Arava per rheumatology.  CT reviewed with UIP fibrosis.  Clinically she appears stable and on comparison with CT in 2018 there appears to be honeycombing even then.  She will need a follow-up CT as the last one was in January 2021  We had discussed antifibrotics in the past but she was not interested in therapy Will reassess repeat CT and reevaluate  Mild persistent asthma As the prednisone was tapered off in 2018 she has developed some wheezing.  Unable to obtain PFTs due to inability to follow instructions.  Has mild elevation in FENO and peripheral eosinophilia.  She was temporarily on Brovana and Pulmicort.  Now off them as her breathing has continued to improve Continue duo nebs as needed.  Health maintenance 12/01/2018-influenza 09/04/2015- Pneumovax.  Plan/Recommendations: - High-res CT - Continue duo nebs as needed  Chilton Greathouse MD  Pulmonary and Critical Care 10/29/2020, 10:24 AM  CC: Dema Severin, NP

## 2020-11-19 ENCOUNTER — Other Ambulatory Visit: Payer: Medicare HMO

## 2020-11-22 ENCOUNTER — Ambulatory Visit
Admission: RE | Admit: 2020-11-22 | Discharge: 2020-11-22 | Disposition: A | Discharge status: Home / Self Care | Payer: Medicare HMO | Source: Ambulatory Visit | Attending: Pulmonary Disease | Admitting: Pulmonary Disease

## 2020-11-22 DIAGNOSIS — J849 Interstitial pulmonary disease, unspecified: Secondary | ICD-10-CM

## 2021-01-02 NOTE — Progress Notes (Signed)
Office Visit Note  Patient: Mary [name]             Date of Birth: [birth year]           MRN: [id number]             PCP: Imagene Riches, NP Referring: Imagene Riches, NP Visit Date: 01/16/2021 Occupation: @GUAROCC @  Subjective:  Pain in both knees   History of Present Illness: Mary [name] is a 80 y.o. female with the history of Ro arthritis and osteoarthritis.  She was accompanied by her daughter today.  She states she continues to have discomfort in her knee joints.  None of the other joints are painful or swollen.  She is unable to walk and is mostly in the wheelchair.  She has been tolerating leflunomide and hydroxychloroquine without any difficulty.  Activities of Daily Living:  Patient reports morning stiffness for 0  none .   Patient Reports nocturnal pain.  Difficulty dressing/grooming: Reports Difficulty climbing stairs: Reports Difficulty getting out of chair: Reports Difficulty using hands for taps, buttons, cutlery, and/or writing: Reports  Review of Systems  Constitutional:  Negative for fatigue.  HENT:  Negative for mouth dryness.   Eyes:  Negative for dryness.  Respiratory:  Positive for shortness of breath.   Cardiovascular:  Positive for swelling in legs/feet.  Gastrointestinal:  Negative for constipation.  Endocrine: Positive for cold intolerance.  Genitourinary:  Negative for difficulty urinating.  Musculoskeletal:  Positive for joint pain, gait problem, joint pain, joint swelling, muscle weakness and muscle tenderness.  Skin:  Negative for rash.  Allergic/Immunologic: Negative for susceptible to infections.  Neurological:  Positive for numbness and weakness.  Hematological:  Negative for bruising/bleeding tendency.  Psychiatric/Behavioral:  Negative for sleep disturbance.    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[place])    Hypoxia 09/03/2016   Essential hypertension 09/03/2016   Urinary tract infection 09/03/2016   Rheumatoid arthritis of multiple sites with negative rheumatoid factor ([place]) 02/28/2016   High risk medication use 02/28/2016   Idiopathic chronic gout of multiple sites without tophus 02/28/2016   Primary osteoarthritis of both knees 02/28/2016   Flexion contractures 02/28/2016   History of hypothyroidism 02/28/2016   History of diabetes mellitus 02/28/2016   History of shingles 02/28/2016   History of hyperlipidemia 02/28/2016    Past Medical History:  Diagnosis Date   Complication of anesthesia    Diabetes mellitus without complication (HCC)    Dyspnea    Gout    High cholesterol    History of kidney stones    Hypertension    Hypoxia 08/2016   Osteoarthritis    PONV (postoperative nausea and vomiting)    Rheumatoid arthritis ([place])     Family History  Problem Relation Age of Onset   Arthritis Brother    Arthritis Sister    Arthritis Sister    Arthritis Brother    Arthritis Brother    Arthritis Brother    Past Surgical History:  Procedure Laterality Date   ABDOMINAL HYSTERECTOMY     Social History   Social History Narrative   Not on file   Immunization History  Administered Date(s) Administered   Fluad Quad(high Dose 65+) 12/01/2018   Influenza, High Dose Seasonal PF 11/26/2015   Influenza-Unspecified 12/07/2016  PFIZER(Purple Top)SARS-COV-2 Vaccination 04/07/2019, 04/28/2019   Pneumococcal-Unspecified 09/04/2015     Objective: Vital Signs: BP 112/67 (BP Location: Right Arm, Patient Position: Sitting, Cuff Size: Small)   Pulse 96   Resp 12   Ht 5' (1.524 m)   BMI 29.29 kg/m    Physical Exam Vitals reviewed. Nursing note reviewed: She is in a wheelchair.. Constitutional:      Appearance: She is well-developed.  HENT:     Head: Normocephalic and atraumatic.   Eyes:     Conjunctiva/sclera: Conjunctivae normal.  Cardiovascular:     Rate and Rhythm: Normal rate and regular rhythm.     Heart sounds: Normal heart sounds.  Pulmonary:     Effort: Pulmonary effort is normal.     Breath sounds: Normal breath sounds.  Abdominal:     General: Bowel sounds are normal.     Palpations: Abdomen is soft.  Musculoskeletal:     Cervical back: Normal range of motion.  Lymphadenopathy:     Cervical: No cervical adenopathy.  Skin:    General: Skin is warm and dry.     Capillary Refill: Capillary refill takes less than 2 seconds.  Neurological:     Mental Status: She is alert and oriented to person, place, and time.  Psychiatric:        Behavior: Behavior normal.     Musculoskeletal Exam: C-spine was in good range of motion.  Shoulder joints had good abduction and limited internal rotation.  Mild contractures in  bilateral elbows were noted.  She has very minimal range of motion finger bilateral wrist joints with synovial thickening but no active synovitis.  She is contractures in MCPs and PIP joints.  Edema was noted in her right hand but no synovitis was noted.  Hip joints were difficult to assess in the wheelchair.  She had no swelling over her knee joints with discomfort range of motion.  She had limited range of motion of her ankles.  No tenderness was noted over ankles or MTPs.  CDAI Exam: CDAI Score: 2.4  Patient Global: 2 mm; Provider Global: 2 mm Swollen: 0 ; Tender: 2  Joint Exam 01/16/2021      Right  Left  Knee   Tender   Tender     Investigation: No additional findings.  Imaging: No results found.  Recent Labs: Lab Results  Component Value Date   WBC 6.6 01/13/2021   HGB 10.6 (L) 01/13/2021   PLT 276 01/13/2021   NA 139 01/13/2021   K 4.6 01/13/2021   CL 106 01/13/2021   CO2 17 (L) 01/13/2021   GLUCOSE 151 (H) 01/13/2021   BUN 19 01/13/2021   CREATININE 0.73 01/13/2021   BILITOT 0.2 01/13/2021   ALKPHOS 96 01/13/2021    AST 11 01/13/2021   ALT 8 01/13/2021   PROT 7.3 01/13/2021   ALBUMIN 4.2 01/13/2021   CALCIUM 8.9 01/13/2021   GFRAA 95 03/26/2020    Speciality Comments: Prior therapy: Plaquenil (d/c per opthamologist recommendation) and SSZ (diarrhea) PLQ Eye Exam: 04/26/19 WNL [place] Follow up in 1 year Patient states she had PLQ Eye Exam in 2022, patient will have eye exam faxed to office  Please scheudle patient's appts. every 3 month per patient request for lab compliance.   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  She is wheelchair-bound: She continues to have some discomfort in her knee joints.  No swelling was noted.  She has multiple joint contractures due to severe rheumatoid arthritis.  Besides her knee joints none of the other joints are painful.  No synovitis was noted.  She is tolerating leflunomide and Plaquenil without any difficulty.  High risk medication use - Arava 20 mg 1 tablet by mouth daily and plaquenil 200 mg 1 tablet by mouth twice daily M-F. PLQ Eye Exam: 04/26/19.  Labs obtained on January 13, 2021 showed improvement in anemia with hemoglobin of 10.6.  GFR is stable.  She was advised to stop leflunomide and hydroxychloroquine in case she develops an infection and resume after the infection resolves.  Updated information about immunization was also placed in the AVS.  She was advised to get labs in February and then every 3 months to monitor for drug toxicity.  Primary osteoarthritis of both knees-she has severe end-stage osteoarthritis of bilateral knee joints.  She is unable to walk and is wheelchair-bound.  Idiopathic chronic gout of multiple sites without tophus - allopurinol 200 mg by mouth daily for management of gout. Uric acid was 6.2  on 07/17/20.  We will check uric acid with the next labs.  Flexion contractures - End stage rheumatoid arthritis. Flexion contracture ~10 degrees in both elbows.  Age-related osteoporosis without current pathological fracture - DEXA on 06/18/2020 Right femoral neck BMD 0.593 with T score -2.3.  Calcium rich diet was discussed.  Essential hypertension-blood pressure is normal today.  Ro medical problems are listed as follows:  History of hyperlipidemia  History of kidney stones  History of diabetes mellitus  History of hypothyroidism  History of shingles  Orders: No orders of the defined types were placed in this encounter.  Meds ordered this encounter  Medications   hydroxychloroquine (PLAQUENIL) 200 MG tablet    Sig: TAKE 1 TABLET BY MOUTH TWICE DAILY MONDAY THROUGH FRIDAY ONLY    Dispense:  120 tablet    Refill:  0      Follow-Up Instructions: Return in about 5 months (around 06/16/2021) for Rheumatoid arthritis, Osteoarthritis, Gout.   Pollyann Savoy, MD  Note - This record has been created using Animal nutritionist.  Chart creation errors have been sought, but may not always  have been located. Such creation errors do not reflect on  the standard of medical care.

## 2021-01-08 ENCOUNTER — Other Ambulatory Visit: Payer: Self-pay | Admitting: Physician Assistant

## 2021-01-08 DIAGNOSIS — Z79899 Other long term (current) drug therapy: Secondary | ICD-10-CM

## 2021-01-08 NOTE — Telephone Encounter (Signed)
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 They will update this week. Speaking with patient's daughter per her request, they would like to discuss patient being seen every 3 months so her labs can also be done at the same time and it's not an extra trip. Patient does not get out much.   Okay to refill Arava?

## 2021-01-08 NOTE — Telephone Encounter (Signed)
Ok to schedule office visits every 3 months in order to increase compliance with lab work.

## 2021-01-14 LAB — CBC WITH DIFFERENTIAL/PLATELET
Basophils Absolute: 0.1 10*3/uL (ref 0.0–0.2)
Basos: 1 %
EOS (ABSOLUTE): 0.5 10*3/uL — ABNORMAL HIGH (ref 0.0–0.4)
Eos: 8 %
Hematocrit: 33.7 % — ABNORMAL LOW (ref 34.0–46.6)
Hemoglobin: 10.6 g/dL — ABNORMAL LOW (ref 11.1–15.9)
Immature Grans (Abs): 0 10*3/uL (ref 0.0–0.1)
Immature Granulocytes: 0 %
Lymphocytes Absolute: 1.2 10*3/uL (ref 0.7–3.1)
Lymphs: 18 %
MCH: 28.6 pg (ref 26.6–33.0)
MCHC: 31.5 g/dL (ref 31.5–35.7)
MCV: 91 fL (ref 79–97)
Monocytes Absolute: 0.6 10*3/uL (ref 0.1–0.9)
Monocytes: 9 %
Neutrophils Absolute: 4.2 10*3/uL (ref 1.4–7.0)
Neutrophils: 64 %
Platelets: 276 10*3/uL (ref 150–450)
RBC: 3.71 x10E6/uL — ABNORMAL LOW (ref 3.77–5.28)
RDW: 15.5 % — ABNORMAL HIGH (ref 11.7–15.4)
WBC: 6.6 10*3/uL (ref 3.4–10.8)

## 2021-01-14 LAB — CMP14+EGFR
ALT: 8 IU/L (ref 0–32)
AST: 11 IU/L (ref 0–40)
Albumin/Globulin Ratio: 1.4 (ref 1.2–2.2)
Albumin: 4.2 g/dL (ref 3.7–4.7)
Alkaline Phosphatase: 96 IU/L (ref 44–121)
BUN/Creatinine Ratio: 26 (ref 12–28)
BUN: 19 mg/dL (ref 8–27)
Bilirubin Total: 0.2 mg/dL (ref 0.0–1.2)
CO2: 17 mmol/L — ABNORMAL LOW (ref 20–29)
Calcium: 8.9 mg/dL (ref 8.7–10.3)
Chloride: 106 mmol/L (ref 96–106)
Creatinine, Ser: 0.73 mg/dL (ref 0.57–1.00)
Globulin, Total: 3.1 g/dL (ref 1.5–4.5)
Glucose: 151 mg/dL — ABNORMAL HIGH (ref 70–99)
Potassium: 4.6 mmol/L (ref 3.5–5.2)
Sodium: 139 mmol/L (ref 134–144)
Total Protein: 7.3 g/dL (ref 6.0–8.5)
eGFR: 84 mL/min/{1.73_m2} (ref >59)

## 2021-01-14 NOTE — Progress Notes (Signed)
Glucose elevated.  Anemia noted which is stable.

## 2021-01-16 ENCOUNTER — Encounter: Payer: Self-pay | Admitting: Rheumatology

## 2021-01-16 ENCOUNTER — Other Ambulatory Visit: Payer: Self-pay | Admitting: Rheumatology

## 2021-01-16 ENCOUNTER — Ambulatory Visit: Payer: Medicare HMO | Admitting: Rheumatology

## 2021-01-16 ENCOUNTER — Other Ambulatory Visit: Payer: Self-pay

## 2021-01-16 VITALS — BP 112/67 | HR 96 | Resp 12 | Ht 60.0 in

## 2021-01-16 DIAGNOSIS — M2450 Contracture, unspecified joint: Secondary | ICD-10-CM

## 2021-01-16 DIAGNOSIS — M1A09X0 Idiopathic chronic gout, multiple sites, without tophus (tophi): Secondary | ICD-10-CM | POA: Diagnosis not present

## 2021-01-16 DIAGNOSIS — Z87442 Personal history of urinary calculi: Secondary | ICD-10-CM

## 2021-01-16 DIAGNOSIS — Z8639 Personal history of other endocrine, nutritional and metabolic disease: Secondary | ICD-10-CM

## 2021-01-16 DIAGNOSIS — M810 Age-related osteoporosis without current pathological fracture: Secondary | ICD-10-CM

## 2021-01-16 DIAGNOSIS — Z79899 Other long term (current) drug therapy: Secondary | ICD-10-CM | POA: Diagnosis not present

## 2021-01-16 DIAGNOSIS — Z8619 Personal history of other infectious and parasitic diseases: Secondary | ICD-10-CM

## 2021-01-16 DIAGNOSIS — M170 Bilateral primary osteoarthritis of knee: Secondary | ICD-10-CM

## 2021-01-16 DIAGNOSIS — M0609 Rheumatoid arthritis without rheumatoid factor, multiple sites: Secondary | ICD-10-CM

## 2021-01-16 DIAGNOSIS — I10 Essential (primary) hypertension: Secondary | ICD-10-CM

## 2021-01-16 MED ORDER — HYDROXYCHLOROQUINE SULFATE 200 MG PO TABS
ORAL_TABLET | ORAL | 0 refills | Status: DC
Start: 2021-01-16 — End: 2021-04-15

## 2021-01-16 NOTE — Patient Instructions (Signed)
Standing Labs °We placed an order today for your standing lab work.  ° °Please have your standing labs drawn in February and every 3 months ° °If possible, please have your labs drawn 2 weeks prior to your appointment so that the provider can discuss your results at your appointment. ° °Please note that you may see your imaging and lab results in MyChart before we have reviewed them. °We may be awaiting multiple results to interpret others before contacting you. °Please allow our office up to 72 hours to thoroughly review all of the results before contacting the office for clarification of your results. ° °We have open lab daily: °Monday through Thursday from 1:30-4:30 PM and Friday from 1:30-4:00 PM °at the office of Dr. Emine Lopata, [facility].   °Please be advised, all patients with office appointments requiring lab work will take precedent over walk-in lab work.  °If possible, please come for your lab work on Monday and Friday afternoons, as you may experience shorter wait times. °The office is located at [address], [address], [place], [place] [address] °No appointment is necessary.   °Labs are drawn by [facility]. Please bring your co-pay at the time of your lab draw.  You may receive a bill from [facility] for your lab work. ° °If you wish to have your labs drawn at another location, please call the office 24 hours in advance to send orders. ° °If you have any questions regarding directions or hours of operation,  °please call [phone].   °As a reminder, please drink plenty of water prior to coming for your lab work. Thanks!  ° °Vaccines °You are taking a medication(s) that can suppress your immune system.  The following immunizations are recommended: °Flu annually °Covid-19  °Td/Tdap (tetanus, diphtheria, pertussis) every 10 years °Pneumonia (Prevnar 15 then Pneumovax 23 at least 1 year apart.  Alternatively, can take Prevnar 20 without needing additional dose) °Shingrix: 2 doses from 4  weeks to 6 months apart ° °Please check with your PCP to make sure you are up to date.  ° °If you have signs or symptoms of an infection or start antibiotics: °First, call your PCP for workup of your infection. °Hold your medication through the infection, until you complete your antibiotics, and until symptoms resolve if you take the following: °Injectable medication (Actemra, Benlysta, Cimzia, Cosentyx, Enbrel, Humira, Kevzara, Orencia, Remicade, Simponi, Stelara, Taltz, Tremfya) °Methotrexate °Leflunomide (Arava) °Mycophenolate (Cellcept) °Xeljanz, Olumiant, or Rinvoq  ° °

## 2021-01-28 ENCOUNTER — Other Ambulatory Visit: Payer: Self-pay | Admitting: Pulmonary Disease

## 2021-01-28 DIAGNOSIS — J849 Interstitial pulmonary disease, unspecified: Secondary | ICD-10-CM

## 2021-02-07 ENCOUNTER — Telehealth: Payer: Self-pay | Admitting: *Deleted

## 2021-02-07 NOTE — Telephone Encounter (Signed)
Labs received from:[facility] Drawn on:02/03/2021 Reviewed by:Sherron Ales, PA-C  Labs drawn:Vitamin D, CMP, Iron, CBC w/Diff, A1C,   Results:  CO2  20 Glucose  116 Transferrin 189 Ferritin  434 RBC  3.41 Hgb  9.9 Hct  31.3 MCHC  31.7 RDW  18.5 MPV  10.6 Hgb A1C 6.7

## 2021-04-10 ENCOUNTER — Other Ambulatory Visit: Payer: Self-pay | Admitting: Physician Assistant

## 2021-04-10 NOTE — Telephone Encounter (Signed)
Next Visit: 06/20/2021  Last Visit: 01/16/2021  Last Fill: 01/08/2021  DX: Rheumatoid arthritis of multiple sites with negative rheumatoid factor  Current Dose per office note 01/16/2021: Arava 20 mg 1 tablet by mouth daily   Labs: 02/03/2021 CO2                 20 Glucose           116 Transferrin       189 Ferritin             434 RBC                 3.41 Hgb                  9.9 Hct                   31.3 MCHC             31.7 RDW                18.5 MPV                10.6 Hgb A1C         6.7       Okay to refill Arava?

## 2021-04-15 ENCOUNTER — Other Ambulatory Visit: Payer: Self-pay | Admitting: Rheumatology

## 2021-04-15 NOTE — Telephone Encounter (Signed)
Next Visit: 06/20/2021   Last Visit: 01/16/2021   Last Fill: 01/16/2021  DX: Rheumatoid arthritis of multiple sites with negative rheumatoid factor   Current Dose per office note 01/16/2021: plaquenil 200 mg 1 tablet by mouth twice daily M-F  Labs: 02/03/2021 CO2                 20 Glucose           116 Transferrin       189 Ferritin             434 RBC                 3.41 Hgb                  9.9 Hct                   31.3 MCHC             31.7 RDW                18.5 MPV                10.6  PLQ Eye Exam 05/02/2020 WNL  Okay to refill PLQ?

## 2021-04-24 ENCOUNTER — Encounter: Payer: Self-pay | Admitting: Rheumatology

## 2021-06-12 NOTE — Progress Notes (Signed)
? ?Office Visit Note ? ?Patient: Mary [name]             ?Date of Birth: 1940/06/21           ?MRN: [id number]             ?PCP: Imagene Riches, NP ?Referring: Imagene Riches, NP ?Visit Date: 06/20/2021 ?Occupation: @GUAROCC @ ? ?Subjective:  ?Medication monitoring  ? ?History of Present Illness: Mary [name] is a 81 y.o. female with history of seronegative rheumatoid arthritis, osteoarthritis, gout.  Patient is taking Arava 20 mg 1 tablet by mouth daily and Plaquenil 200 mg 1 tablet twice daily Monday through Friday.  Mary [name] remains on Arava and Plaquenil as combination therapy and has not missed any doses recently and her had any recent side effects.  Mary [name] denies any recent infections.  Mary [name] denies any increased joint pain or joint swelling due to rheumatoid arthritis.  Mary [name] continues to experience stiffness especially in both shoulders and both hands.  Mary [name] states that Mary [name] has chronic pain in both knees including her right knee replacement.  Mary [name] remains wheelchair-bound.  Mary [name] denies any signs or symptoms of a gout flare.  Mary [name] is taking allopurinol 200 mg by mouth daily.  ?Mary [name] had lab work drawn last week so we will call to obtain these records.  ? ?Activities of Daily Living:  ?Patient reports morning stiffness for 0 minutes.   ?Patient Reports nocturnal pain.  ?Difficulty dressing/grooming: Reports ?Difficulty climbing stairs: Reports ?Difficulty getting out of chair: Reports ?Difficulty using hands for taps, buttons, cutlery, and/or writing: Reports ? ?Review of Systems  ?Constitutional:  Negative for fatigue.  ?HENT:  Negative for mouth sores, mouth dryness and nose dryness.   ?Eyes:  Positive for itching. Negative for pain and dryness.  ?Respiratory:  Positive for cough and shortness of breath.   ?Cardiovascular:  Negative for chest pain and palpitations.  ?Gastrointestinal:  Negative for blood in stool, constipation and diarrhea.  ?Endocrine: Negative for increased urination.  ?Genitourinary:  Negative for  difficulty urinating.  ?Musculoskeletal:  Positive for joint pain, joint pain and joint swelling. Negative for myalgias, morning stiffness, muscle tenderness and myalgias.  ?Skin:  Negative for color change, rash and redness.  ?Allergic/Immunologic: Negative for susceptible to infections.  ?Neurological:  Negative for dizziness, numbness, headaches, memory loss and weakness.  ?Hematological:  Negative for bruising/bleeding tendency.  ?Psychiatric/Behavioral:  Negative for confusion.   ? ?PMFS History:  ?Patient Active Problem List  ? Diagnosis Date Noted  ? Mild persistent asthma without complication 123XX123  ? Age-related osteoporosis without current pathological fracture 11/19/2016  ? Acute respiratory failure ([place])   ? 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 Hypoxia 09/03/2016  ? Essential hypertension 09/03/2016  ? Urinary tract infection 09/03/2016  ? Rheumatoid arthritis of multiple sites with negative rheumatoid factor ([place]) 02/28/2016  ? High risk medication use 02/28/2016  ? Idiopathic chronic gout of multiple sites without tophus 02/28/2016  ? Primary osteoarthritis of both knees 02/28/2016  ? Flexion contractures 02/28/2016  ? History of hypothyroidism 02/28/2016  ? History of diabetes mellitus 02/28/2016  ? History of shingles 02/28/2016  ? History of hyperlipidemia 02/28/2016  ?  ?Past Medical History:  ?Diagnosis Date  ? Complication of anesthesia   ? Diabetes mellitus without complication ([place])   ? Dyspnea   ? Gout   ? High cholesterol   ? History of kidney stones   ? Hypertension   ? Hypoxia 08/2016  ? Osteoarthritis   ? PONV (postoperative  nausea and vomiting)   ? Rheumatoid arthritis ([place])   ?  ?Family History  ?Problem Relation Age of Onset  ? Arthritis Brother   ? Arthritis Sister   ? Arthritis Sister   ? Arthritis Brother   ? Arthritis Brother   ? Arthritis Brother   ? ?Past Surgical History:  ?Procedure Laterality Date  ? ABDOMINAL HYSTERECTOMY    ? ?Social History  ? ?Social History Narrative  ? Not on  file  ? ?Immunization History  ?Administered Date(s) Administered  ? Fluad Quad(high Dose 65+) 12/01/2018  ? Influenza, High Dose Seasonal PF 11/26/2015  ? Influenza-Unspecified 12/07/2016  ? PFIZER(Purple Top)SARS-COV-2 Vaccination 04/07/2019, 04/28/2019  ? Pneumococcal-Unspecified 09/04/2015  ?  ? ?Objective: ?Vital Signs: BP 124/75 (BP Location: Left Arm, Patient Position: Sitting, Cuff Size: Normal)   Pulse 68   Ht 5\' 5"  (1.651 m)   Wt 150 lb (68 kg) Comment: per patient  BMI 24.96 kg/m?   ? ?Physical Exam ?Vitals and nursing note reviewed.  ?Constitutional:   ?   Appearance: Mary [name] is well-developed.  ?HENT:  ?   Head: Normocephalic and atraumatic.  ?Eyes:  ?   Conjunctiva/sclera: Conjunctivae normal.  ?Cardiovascular:  ?   Rate and Rhythm: Normal rate and regular rhythm.  ?   Heart sounds: Normal heart sounds.  ?Pulmonary:  ?   Effort: Pulmonary effort is normal.  ?   Breath sounds: Normal breath sounds.  ?Abdominal:  ?   General: Bowel sounds are normal.  ?   Palpations: Abdomen is soft.  ?Musculoskeletal:  ?   Cervical back: Normal range of motion.  ?Skin: ?   General: Skin is warm and dry.  ?   Capillary Refill: Capillary refill takes less than 2 seconds.  ?Neurological:  ?   Mental Status: Mary [name] is alert and oriented to person, place, and time.  ?Psychiatric:     ?   Behavior: Behavior normal.  ?  ? ?Musculoskeletal Exam: Patient remained wheelchair bound.  C-spine has limited range of motion with discomfort.  Thoracic kyphosis noted.  Shoulder joint have limited abduction to about 90 degrees bilaterally.  Limited internal rotation of both shoulders.  Bilateral elbow joint contractures noted.  Severely limited range of motion of both wrist joints.  Diffuse edema in the right hand but no active synovitis was noted.  Contractures of PIP and DIP joints noted.  Hip joints difficult to assess well in seated position in the wheelchair.  Right knee replacement has limited extension.  Left knee joint has a flexion  contracture with warmth but no swelling.  Mary [name] has good range of motion of the left ankle joint with no tenderness or synovitis.  Right ankle edema noted. ? ?CDAI Exam: ?CDAI Score: -- ?Patient Global: --; Provider Global: -- ?Swollen: --; Tender: -- ?Joint Exam 06/20/2021  ? ?No joint exam has been documented for this visit  ? ?There is currently no information documented on the homunculus. Go to the Rheumatology activity and complete the homunculus joint exam. ? ?Investigation: ?No additional findings. ? ?Imaging: ?No results found. ? ?Recent Labs: ?Lab Results  ?Component Value Date  ? WBC 6.6 01/13/2021  ? HGB 10.6 (L) 01/13/2021  ? PLT 276 01/13/2021  ? NA 139 01/13/2021  ? K 4.6 01/13/2021  ? CL 106 01/13/2021  ? CO2 17 (L) 01/13/2021  ? GLUCOSE 151 (H) 01/13/2021  ? BUN 19 01/13/2021  ? CREATININE 0.73 01/13/2021  ? BILITOT 0.2 01/13/2021  ? ALKPHOS 96 01/13/2021  ?  AST 11 01/13/2021  ? ALT 8 01/13/2021  ? PROT 7.3 01/13/2021  ? ALBUMIN 4.2 01/13/2021  ? CALCIUM 8.9 01/13/2021  ? GFRAA 95 03/26/2020  ? ? ?Speciality Comments: Prior therapy: Plaquenil (d/c per opthamologist recommendation) and SSZ (diarrhea) ?PLQ Eye Exam: 04/23/2021 WNL [place] Follow up 1 year ? ?Please scheudle patient's appts. every 3 month per patient request for lab compliance.  ? ?Procedures:  ?No procedures performed ?Allergies: Aspirin and Codeine  ? ?Assessment / Plan:     ?Visit Diagnoses: Rheumatoid arthritis of multiple sites with negative rheumatoid factor (HCC) - Severe end-stage rheumatoid arthritis with multiple contractures.  Mary [name] is wheelchair-bound: Mary [name] has no synovitis on examination today.  Mary [name] has not had any signs or symptoms of a rheumatoid arthritis flare.  Her rheumatoid arthritis remains stable on arava 20 mg 1 tablet by mouth daily and plaquenil 200 mg 1 tablet by mouth twice daily M-F.  Mary [name] continues to tolerate both medications without any side effects and has not missed any doses recently.   No recent infections. Mary [name] will remain on combination therapy as prescribed.  Mary [name] will follow up in 3 months or sooner if needed.  ? ?High risk medication use - Arava 20 mg 1 tablet by mouth daily and plaquen

## 2021-06-20 ENCOUNTER — Ambulatory Visit: Payer: Medicare HMO | Admitting: Physician Assistant

## 2021-06-20 ENCOUNTER — Encounter: Payer: Self-pay | Admitting: Physician Assistant

## 2021-06-20 VITALS — BP 124/75 | HR 68 | Ht 65.0 in | Wt 150.0 lb

## 2021-06-20 DIAGNOSIS — Z79899 Other long term (current) drug therapy: Secondary | ICD-10-CM

## 2021-06-20 DIAGNOSIS — M1A09X0 Idiopathic chronic gout, multiple sites, without tophus (tophi): Secondary | ICD-10-CM | POA: Diagnosis not present

## 2021-06-20 DIAGNOSIS — M170 Bilateral primary osteoarthritis of knee: Secondary | ICD-10-CM

## 2021-06-20 DIAGNOSIS — I10 Essential (primary) hypertension: Secondary | ICD-10-CM

## 2021-06-20 DIAGNOSIS — Z87442 Personal history of urinary calculi: Secondary | ICD-10-CM

## 2021-06-20 DIAGNOSIS — Z8619 Personal history of other infectious and parasitic diseases: Secondary | ICD-10-CM

## 2021-06-20 DIAGNOSIS — Z96652 Presence of left artificial knee joint: Secondary | ICD-10-CM

## 2021-06-20 DIAGNOSIS — M0609 Rheumatoid arthritis without rheumatoid factor, multiple sites: Secondary | ICD-10-CM

## 2021-06-20 DIAGNOSIS — M1711 Unilateral primary osteoarthritis, right knee: Secondary | ICD-10-CM

## 2021-06-20 DIAGNOSIS — Z8639 Personal history of other endocrine, nutritional and metabolic disease: Secondary | ICD-10-CM

## 2021-06-20 DIAGNOSIS — M810 Age-related osteoporosis without current pathological fracture: Secondary | ICD-10-CM

## 2021-06-20 DIAGNOSIS — M2450 Contracture, unspecified joint: Secondary | ICD-10-CM

## 2021-06-20 NOTE — Patient Instructions (Signed)
Standing Labs ?We placed an order today for your standing lab work.  ? ?Please have your standing labs drawn in July and every 3 months  ? ?If possible, please have your labs drawn 2 weeks prior to your appointment so that the provider can discuss your results at your appointment. ? ?Please note that you may see your imaging and lab results in MyChart before we have reviewed them. ?We may be awaiting multiple results to interpret others before contacting you. ?Please allow our office up to 72 hours to thoroughly review all of the results before contacting the office for clarification of your results. ? ?We have open lab daily: ?Monday through Thursday from 1:30-4:30 PM and Friday from 1:30-4:00 PM ?at the office of Dr. Shaili Deveshwar, [facility].   ?Please be advised, all patients with office appointments requiring lab work will take precedent over walk-in lab work.  ?If possible, please come for your lab work on Monday and Friday afternoons, as you may experience shorter wait times. ?The office is located at [address], [address], [place], [place] [address] ?No appointment is necessary.   ?Labs are drawn by [facility]. Please bring your co-pay at the time of your lab draw.  You may receive a bill from [facility] for your lab work. ? ?Please note if you are on Hydroxychloroquine and and an order has been placed for a Hydroxychloroquine level, you will need to have it drawn 4 hours or more after your last dose. ? ?If you wish to have your labs drawn at another location, please call the office 24 hours in advance to send orders. ? ?If you have any questions regarding directions or hours of operation,  ?please call [phone].   ?As a reminder, please drink plenty of water prior to coming for your lab work. Thanks! ? ?

## 2021-07-08 ENCOUNTER — Other Ambulatory Visit: Payer: Self-pay | Admitting: Physician Assistant

## 2021-07-08 NOTE — Telephone Encounter (Signed)
Called patient to schedule 3 month follow-up appointment.   Patient will call back to schedule once she has her July schedule.   ?

## 2021-07-08 NOTE — Telephone Encounter (Signed)
Please schedule patient for a follow up visit. Patient due July 2023. Thanks!  °

## 2021-07-08 NOTE — Telephone Encounter (Signed)
Next Visit: Due July 2023. Message sent to the front to schedule patient.  ? ?Last Visit: 06/20/2021 ? ?Last Fill: 04/10/2021 ? ?DX: Rheumatoid arthritis of multiple sites with negative rheumatoid factor  ? ?Current Dose per office note 06/20/2021: Arava 20 mg 1 tablet by mouth daily  ? ?Labs: 02/07/2021 CO2                 20 ?Glucose           116 ?Transferrin       189 ?Ferritin             434 ?RBC                 3.41 ?Hgb                  9.9 ?Hct                   31.3 ?MCHC             31.7 ?RDW                18.5 ?MPV                10.6 ? ?Per last office note on 06/20/2021: Patient had updated lab work last week.  Her next lab work will be due in July and every 3 months. Labs have not been scanned into chart as of yet.  ? ?Okay to refill Arava?  ?

## 2021-07-14 ENCOUNTER — Other Ambulatory Visit: Payer: Self-pay | Admitting: Rheumatology

## 2021-07-14 NOTE — Telephone Encounter (Signed)
Next Visit: Due July 2023. Message sent to the front to schedule patient.  ?  ?Last Visit: 06/20/2021 ?  ?Last Fill: 04/15/2021 ?  ?DX: Rheumatoid arthritis of multiple sites with negative rheumatoid factor  ?  ?Current Dose per office note 06/20/2021:  plaquenil 200 mg 1 tablet by mouth twice daily M-F ?  ?Labs: 02/07/2021 CO2                 20 ?Glucose           116 ?Transferrin       189 ?Ferritin             434 ?RBC                 3.41 ?Hgb                  9.9 ?Hct                   31.3 ?MCHC             31.7 ?RDW                18.5 ?MPV                10.6 ?  ? PLQ Eye Exam: 04/23/2021 WNL  ?  ?Okay to refill PLQ? ?

## 2021-09-15 ENCOUNTER — Telehealth: Payer: Self-pay | Admitting: Rheumatology

## 2021-09-15 ENCOUNTER — Other Ambulatory Visit: Payer: Self-pay | Admitting: *Deleted

## 2021-09-15 DIAGNOSIS — Z79899 Other long term (current) drug therapy: Secondary | ICD-10-CM

## 2021-09-15 NOTE — Telephone Encounter (Signed)
Lab Orders released.  

## 2021-09-15 NOTE — Telephone Encounter (Signed)
Patient called the office requesting lab orders be sent to [facility]. Patient plans on going Tuesday or Wednesday this week.

## 2021-09-18 LAB — CBC WITH DIFFERENTIAL/PLATELET
Basophils Absolute: 0.1 10*3/uL (ref 0.0–0.2)
Basos: 1 %
EOS (ABSOLUTE): 0.7 10*3/uL — ABNORMAL HIGH (ref 0.0–0.4)
Eos: 9 %
Hematocrit: 33.2 % — ABNORMAL LOW (ref 34.0–46.6)
Hemoglobin: 10.4 g/dL — ABNORMAL LOW (ref 11.1–15.9)
Immature Grans (Abs): 0 10*3/uL (ref 0.0–0.1)
Immature Granulocytes: 0 %
Lymphocytes Absolute: 1.3 10*3/uL (ref 0.7–3.1)
Lymphs: 16 %
MCH: 28 pg (ref 26.6–33.0)
MCHC: 31.3 g/dL — ABNORMAL LOW (ref 31.5–35.7)
MCV: 90 fL (ref 79–97)
Monocytes Absolute: 0.6 10*3/uL (ref 0.1–0.9)
Monocytes: 8 %
Neutrophils Absolute: 5.7 10*3/uL (ref 1.4–7.0)
Neutrophils: 66 %
Platelets: 258 10*3/uL (ref 150–450)
RBC: 3.71 x10E6/uL — ABNORMAL LOW (ref 3.77–5.28)
RDW: 15.8 % — ABNORMAL HIGH (ref 11.7–15.4)
WBC: 8.5 10*3/uL (ref 3.4–10.8)

## 2021-09-18 LAB — CMP14+EGFR
ALT: 7 IU/L (ref 0–32)
AST: 9 IU/L (ref 0–40)
Albumin/Globulin Ratio: 1.3 (ref 1.2–2.2)
Albumin: 3.9 g/dL (ref 3.8–4.8)
Alkaline Phosphatase: 107 IU/L (ref 44–121)
BUN/Creatinine Ratio: 25 (ref 12–28)
BUN: 23 mg/dL (ref 8–27)
Bilirubin Total: 0.2 mg/dL (ref 0.0–1.2)
CO2: 20 mmol/L (ref 20–29)
Calcium: 9 mg/dL (ref 8.7–10.3)
Chloride: 103 mmol/L (ref 96–106)
Creatinine, Ser: 0.93 mg/dL (ref 0.57–1.00)
Globulin, Total: 3.1 g/dL (ref 1.5–4.5)
Glucose: 236 mg/dL — ABNORMAL HIGH (ref 70–99)
Potassium: 4.7 mmol/L (ref 3.5–5.2)
Sodium: 137 mmol/L (ref 134–144)
Total Protein: 7 g/dL (ref 6.0–8.5)
eGFR: 63 mL/min/{1.73_m2} (ref >59)

## 2021-09-18 NOTE — Progress Notes (Signed)
Glucose is elevated-236. Rest of CMP WNL.  Anemia is table.  Absolute eosinophils are chronically elevated. WBC count is WNL. We will continue to monitor.

## 2021-10-13 ENCOUNTER — Other Ambulatory Visit: Payer: Self-pay | Admitting: Rheumatology

## 2021-10-13 MED ORDER — LEFLUNOMIDE 20 MG PO TABS: ORAL_TABLET | ORAL | 0 refills | Status: DC

## 2021-10-13 NOTE — Telephone Encounter (Signed)
Patient called requesting prescription refill of Arava to be sent to [facility] at [address] in [place].

## 2021-10-13 NOTE — Progress Notes (Unsigned)
Office Visit Note  Patient: Mary [name]             Date of Birth: 09-02-1940           MRN: [id number]             PCP: Dema Severin, NP Referring: Dema Severin, NP Visit Date: 10/22/2021 Occupation: @GUAROCC @  Subjective:  Neck pain and stiffness   History of Present Illness: [name] is a 81 y.o. female with history of seronegative rheumatoid arthritis, osteoarthritis, gout, and osteoporosis. She is taking Arava 20 mg 1 tablet by mouth daily and plaquenil 200 mg 1 tablet by mouth twice daily M-F.  She is tolerating combination therapy without any side effects.  She has not missed any doses of Arava or Plaquenil recently.  Patient reports that she has had some increased joint pain and stiffness which she attributes to the humidity.  She is having some increased pain in both shoulders, her neck, and both knee joints.  She denies any increased joint swelling at this time.  She is not currently experiencing any muscle spasms.  She takes Tylenol as needed for symptomatic relief.  She remains primarily wheelchair-bound and has been sedentary at home.  She denies any recent falls.  She denies any signs or symptoms of a gout flare.  She remains on allopurinol 200 mg daily. She denies any recent or recurrent infections.  She is planning on having her flu shot in October 2023.     Activities of Daily Living:  Patient reports morning stiffness for a few minutes.   Patient Reports nocturnal pain.  Difficulty dressing/grooming: Reports Difficulty climbing stairs: Reports Difficulty getting out of chair: Reports Difficulty using hands for taps, buttons, cutlery, and/or writing: Reports  Review of Systems  Constitutional:  Negative for fatigue.  HENT:  Positive for mouth dryness. Negative for mouth sores.   Eyes:  Positive for itching and dryness.  Respiratory:  Positive for cough. Negative for shortness of breath.   Cardiovascular:  Negative for chest pain and palpitations.   Gastrointestinal:  Positive for constipation. Negative for blood in stool and diarrhea.  Endocrine: Negative for increased urination.  Genitourinary:  Negative for involuntary urination.  Musculoskeletal:  Positive for joint pain, joint pain and morning stiffness. Negative for gait problem, joint swelling, myalgias, muscle weakness, muscle tenderness and myalgias.  Skin:  Negative for color change, rash, hair loss and sensitivity to sunlight.  Allergic/Immunologic: Negative for susceptible to infections.  Neurological:  Negative for dizziness and headaches.  Hematological:  Negative for swollen glands.  Psychiatric/Behavioral:  Negative for depressed mood and sleep disturbance. The patient is not nervous/anxious.     PMFS History:  Patient Active Problem List   Diagnosis Date Noted   Mild persistent asthma without complication 11/18/2017   Age-related osteoporosis without current pathological fracture 11/19/2016   Acute respiratory failure (HCC)    Rheumatoid lung (HCC)    Hypoxia 09/03/2016   Essential hypertension 09/03/2016   Urinary tract infection 09/03/2016   Rheumatoid arthritis of multiple sites with negative rheumatoid factor (HCC) 02/28/2016   High risk medication use 02/28/2016   Idiopathic chronic gout of multiple sites without tophus 02/28/2016   Primary osteoarthritis of both knees 02/28/2016   Flexion contractures 02/28/2016   History of hypothyroidism 02/28/2016   History of diabetes mellitus 02/28/2016   History of shingles 02/28/2016   History of hyperlipidemia 02/28/2016    Past Medical History:  Diagnosis Date   Complication of anesthesia  Diabetes mellitus without complication (HCC)    Dyspnea    Gout    High cholesterol    History of kidney stones    Hypertension    Hypoxia 08/2016   Osteoarthritis    PONV (postoperative nausea and vomiting)    Rheumatoid arthritis (HCC)     Family History  Problem Relation Age of Onset   Arthritis Brother     Arthritis Sister    Arthritis Sister    Arthritis Brother    Arthritis Brother    Arthritis Brother    Past Surgical History:  Procedure Laterality Date   ABDOMINAL HYSTERECTOMY     Social History   Social History Narrative   Not on file   Immunization History  Administered Date(s) Administered   Fluad Quad(high Dose 65+) 12/01/2018   Influenza, High Dose Seasonal PF 11/26/2015   Influenza-Unspecified 12/07/2016   PFIZER(Purple Top)SARS-COV-2 Vaccination 04/07/2019, 04/28/2019   Pneumococcal-Unspecified 09/04/2015     Objective: Vital Signs: BP 126/80 (BP Location: Left Arm, Patient Position: Sitting, Cuff Size: Normal)   Pulse 90   Resp 18   Ht 5' (1.524 m) Comment: per patient  Wt 150 lb (68 kg) Comment: per patient  BMI 29.29 kg/m    Physical Exam Vitals and nursing note reviewed.  Constitutional:      Appearance: She is well-developed.  HENT:     Head: Normocephalic and atraumatic.  Eyes:     Conjunctiva/sclera: Conjunctivae normal.  Cardiovascular:     Rate and Rhythm: Normal rate and regular rhythm.     Heart sounds: Normal heart sounds.  Pulmonary:     Effort: Pulmonary effort is normal.     Comments: Mild crackles at bilateral lung bases  Abdominal:     General: Bowel sounds are normal.     Palpations: Abdomen is soft.  Musculoskeletal:     Cervical back: Normal range of motion.  Skin:    General: Skin is warm and dry.     Capillary Refill: Capillary refill takes less than 2 seconds.  Neurological:     Mental Status: She is alert and oriented to person, place, and time.  Psychiatric:        Behavior: Behavior normal.      Musculoskeletal Exam: Patient remained wheelchair bound during the exam. C-spine limited with discomfort.  Trapezius muscle tension and tenderness bilaterally, Left > right.  Thoracic kyphosis noted. Painful and limited ROM of both shoulder joints with tenderness upon palpation.  Bilateral elbow joint contractures.  Severely  limited ROM of both wrist joints, right > left.  Incomplete fist formation bilaterally.  Diffuse edema of right hand.  Contractures of PIP and DIPs. Ulnar deviation and contractures of MCPs. No tenderness or synovitis of MCP joints.  Hip joints difficult to assess while seated.  Right knee replacement has painful and limited extension.  Left knee has painful and limited ROM with warmth.  Flexion contracture of left knee. Edema in both ankles, right > left.   CDAI Exam: CDAI Score: -- Patient Global: --; Provider Global: -- Swollen: --; Tender: -- Joint Exam 10/22/2021   No joint exam has been documented for this visit   There is currently no information documented on the homunculus. Go to the Rheumatology activity and complete the homunculus joint exam.  Investigation: No additional findings.  Imaging: No results found.  Recent Labs: Lab Results  Component Value Date   WBC 8.5 09/17/2021   HGB 10.4 (L) 09/17/2021   PLT 258 09/17/2021  NA 137 09/17/2021   K 4.7 09/17/2021   CL 103 09/17/2021   CO2 20 09/17/2021   GLUCOSE 236 (H) 09/17/2021   BUN 23 09/17/2021   CREATININE 0.93 09/17/2021   BILITOT 0.2 09/17/2021   ALKPHOS 107 09/17/2021   AST 9 09/17/2021   ALT 7 09/17/2021   PROT 7.0 09/17/2021   ALBUMIN 3.9 09/17/2021   CALCIUM 9.0 09/17/2021   GFRAA 95 03/26/2020    Speciality Comments: Prior therapy: Plaquenil (d/c per opthamologist recommendation) and SSZ (diarrhea) PLQ Eye Exam: 04/23/2021 WNL [facility] Follow up 1 year  Please scheudle patient's appts. every 3 month per patient request for lab compliance.   Procedures:  No procedures performed Allergies: Aspirin and Codeine      Assessment / Plan:     Visit Diagnoses: Rheumatoid arthritis of multiple sites with negative rheumatoid factor (HCC) - Severe end-stage rheumatoid arthritis with multiple contractures.  She is wheelchair-bound: Patient remains on Arava 20 mg 1 tablet daily and  Plaquenil 200 mg 1 tablet twice daily Monday through Friday.  She is tolerating combination therapy without any side effects and has not missed any doses recently.  She has not had any recent or recurrent infections.  She is experiencing some increased pain and stiffness in her neck, both shoulders, and both knee joints which she attributes to the increased humidity.  On examination no synovitis was noted.  Her symptoms have gradually been improving.  She takes Tylenol as needed for pain relief.  She will remain on the current treatment regimen.  She was advised to notify us if she develops signs or symptoms of a recurrent flare.  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 Isaiah Serge on 10/29/2020 and the office visit note was reviewed today: stable-patient declined anti-fibrotics previously.  She had a high-resolution chest CT on 11/22/2020 ordered by Dr. Isaiah Serge: No significant interval change in mild pulmonary fibrosis.  Small areas of honeycombing in the lung bases noted.  Findings remain consistent with UIP.  Mild crackles at the base of both lungs were auscultated today in the office.  Patient was advised to call to schedule a routine follow-up with Dr. Isaiah Serge.    High risk medication use - Arava 20 mg 1 tablet by mouth daily and plaquenil 200 mg 1 tablet by mouth twice daily M-F PLQ Eye Exam: 04/23/2021 WNL [facility] Follow up 1 year.  CBC and CMP drawn on 09/17/2021.  Her next lab work will be due in October and every 3 months to monitor for drug toxicity. No recent or recurrent infections.  Discussed the importance of holding arava if she develops signs or symptoms of an infection and to resume once the infection has completely cleared.  She is planning on receiving the annual flu shot in October 2023.   Primary osteoarthritis of right knee: Flexion contracture.  Wheelchair bound.   S/P TKR (total knee replacement), left: Chronic  pain.  Limited extension. Wheelchair bound.   Idiopathic chronic gout of multiple sites without tophus -  She is taking allopurinol 200 mg daily for management of gout.  Uric acid was 6.2 on 07/17/2020.  Future order for uric acid was placed today to be drawn with her upcoming lab work.  She has not had any signs or symptoms of a gout flare.  She will remain on the current dose of allopurinol as prescribed.  She was advised to notify us if she  develops signs or symptoms of a flare.  - Plan: Uric acid  Age-related osteoporosis without current pathological fracture - DEXA on 06/18/2020 Right femoral neck BMD 0.593 with T score -2.3.  She is taking vitamin D 5000 units 3 times weekly.  No recent falls or fractures.  Patient is primarily wheelchair-bound.  DEXA can be updated in April 2024.  Flexion contractures - End stage rheumatoid arthritis. Flexion contracture ~10 degrees in both elbows.  Flexion contracture of both knees.   Other medical conditions are listed as follow:   History of diabetes mellitus  History of kidney stones  History of hyperlipidemia  Essential hypertension: BP was 126/80 today in the office.   History of hypothyroidism  History of shingles  Orders: Orders Placed This Encounter  Procedures   Uric acid   No orders of the defined types were placed in this encounter.   Follow-Up Instructions: Return in about 5 months (around 03/24/2022) for Rheumatoid arthritis, Osteoarthritis, Gout, Osteoporosis.   Gearldine Bienenstock, PA-C  Note - This record has been created using Dragon software.  Chart creation errors have been sought, but may not always  have been located. Such creation errors do not reflect on  the standard of medical care.

## 2021-10-13 NOTE — Telephone Encounter (Signed)
Next Visit: 10/22/2021  Last Visit: 06/20/2021  Last Fill: 07/08/2021  DX: Rheumatoid arthritis of multiple sites with negative rheumatoid factor   Current Dose per office note 06/20/2021: Arava 20 mg 1 tablet by mouth daily   Labs: 09/17/2021 Glucose is elevated-236. Rest of CMP WNL. Anemia is table. Absolute eosinophils are chronically elevated. WBC count is WNL.  Okay to refill Arava?

## 2021-10-17 ENCOUNTER — Other Ambulatory Visit: Payer: Self-pay | Admitting: *Deleted

## 2021-10-17 MED ORDER — HYDROXYCHLOROQUINE SULFATE 200 MG PO TABS: ORAL_TABLET | ORAL | 0 refills | Status: DC

## 2021-10-17 NOTE — Telephone Encounter (Signed)
Refill request received via fax from [facility] for PLQ   Next Visit: 10/22/2021   Last Visit: 06/20/2021   Last Fill: 07/14/2021   DX: Rheumatoid arthritis of multiple sites with negative rheumatoid factor    Current Dose per office note 06/20/2021:  plaquenil 200 mg 1 tablet by mouth twice daily M-F  PLQ Eye Exam: 04/23/2021 WNL  Labs: 09/17/2021 Glucose is elevated-236. Rest of CMP WNL.  Anemia is table.  Absolute eosinophils are chronically elevated. WBC count is WNL  Okay to refill PLQ?

## 2021-10-22 ENCOUNTER — Encounter: Payer: Self-pay | Admitting: Physician Assistant

## 2021-10-22 ENCOUNTER — Ambulatory Visit: Payer: Medicare HMO | Attending: Physician Assistant | Admitting: Physician Assistant

## 2021-10-22 VITALS — BP 126/80 | HR 90 | Resp 18 | Ht 60.0 in | Wt 150.0 lb

## 2021-10-22 DIAGNOSIS — M0609 Rheumatoid arthritis without rheumatoid factor, multiple sites: Secondary | ICD-10-CM | POA: Diagnosis not present

## 2021-10-22 DIAGNOSIS — I10 Essential (primary) hypertension: Secondary | ICD-10-CM

## 2021-10-22 DIAGNOSIS — Z8639 Personal history of other endocrine, nutritional and metabolic disease: Secondary | ICD-10-CM

## 2021-10-22 DIAGNOSIS — M2450 Contracture, unspecified joint: Secondary | ICD-10-CM

## 2021-10-22 DIAGNOSIS — M1711 Unilateral primary osteoarthritis, right knee: Secondary | ICD-10-CM

## 2021-10-22 DIAGNOSIS — Z87442 Personal history of urinary calculi: Secondary | ICD-10-CM

## 2021-10-22 DIAGNOSIS — Z79899 Other long term (current) drug therapy: Secondary | ICD-10-CM | POA: Diagnosis not present

## 2021-10-22 DIAGNOSIS — Z8619 Personal history of other infectious and parasitic diseases: Secondary | ICD-10-CM

## 2021-10-22 DIAGNOSIS — J849 Interstitial pulmonary disease, unspecified: Secondary | ICD-10-CM | POA: Diagnosis not present

## 2021-10-22 DIAGNOSIS — M810 Age-related osteoporosis without current pathological fracture: Secondary | ICD-10-CM

## 2021-10-22 DIAGNOSIS — Z96652 Presence of left artificial knee joint: Secondary | ICD-10-CM

## 2021-10-22 DIAGNOSIS — M1A09X0 Idiopathic chronic gout, multiple sites, without tophus (tophi): Secondary | ICD-10-CM

## 2021-10-22 NOTE — Patient Instructions (Signed)
Standing Labs We placed an order today for your standing lab work.   Please have your standing labs drawn in October and every 3 months   If possible, please have your labs drawn 2 weeks prior to your appointment so that the provider can discuss your results at your appointment.  Please note that you may see your imaging and lab results in MyChart before we have reviewed them. We may be awaiting multiple results to interpret others before contacting you. Please allow our office up to 72 hours to thoroughly review all of the results before contacting the office for clarification of your results.  We currently have open lab daily: Monday through Thursday from 1:30 PM-4:30 PM and Friday from 1:30 PM- 4:00 PM If possible, please come for your lab work on Monday, Thursday or Friday afternoons, as you may experience shorter wait times.   Effective December 31, 2021 the new lab hours will change to: Monday through Thursday from 1:30 PM-5:00 PM and Friday from 8:30 AM-12:00 PM If possible, please come for your lab work on Monday and Thursday afternoons, as you may experience shorter wait times.  Please be advised, all patients with office appointments requiring lab work will take precedent over walk-in lab work.    The office is located at [address], [address], [place], [place] [address] No appointment is necessary.   Labs are drawn by [facility]. Please bring your co-pay at the time of your lab draw.  You may receive a bill from [facility] for your lab work.  Please note if you are on Hydroxychloroquine and and an order has been placed for a Hydroxychloroquine level, you will need to have it drawn 4 hours or more after your last dose.  If you wish to have your labs drawn at another location, please call the office 24 hours in advance to send orders.  If you have any questions regarding directions or hours of operation,  please call [phone].   As a reminder, please drink plenty of water prior  to coming for your lab work. Thanks!  

## 2021-10-27 ENCOUNTER — Telehealth: Payer: Self-pay | Admitting: *Deleted

## 2021-10-27 NOTE — Telephone Encounter (Signed)
Labs received from:[facility]   Drawn on:10/10/2021  Reviewed by:Dr. Pollyann Savoy   Labs drawn:CBC, Iron Study  Results:RBC 3.58  Hgb 10.4  MCHC 31.0  RDW 19.2  MPV 19.2  EO# 0.6  Patient is on PLQ 200 mg BID M-F and Arava 20 mg daily.

## 2021-11-19 ENCOUNTER — Encounter: Payer: Self-pay | Admitting: Pulmonary Disease

## 2021-11-19 ENCOUNTER — Ambulatory Visit: Payer: Medicare HMO | Admitting: Pulmonary Disease

## 2021-11-19 VITALS — BP 138/70 | HR 84 | Temp 98.2°F | Ht 60.0 in

## 2021-11-19 DIAGNOSIS — Z23 Encounter for immunization: Secondary | ICD-10-CM | POA: Diagnosis not present

## 2021-11-19 DIAGNOSIS — J849 Interstitial pulmonary disease, unspecified: Secondary | ICD-10-CM | POA: Diagnosis not present

## 2021-11-19 DIAGNOSIS — J4530 Mild persistent asthma, uncomplicated: Secondary | ICD-10-CM | POA: Diagnosis not present

## 2021-11-19 MED ORDER — IPRATROPIUM-ALBUTEROL 0.5-2.5 (3) MG/3ML IN SOLN: 3.0000 mL | Freq: Four times a day (QID) | RESPIRATORY_TRACT | 11 refills | Status: DC | PRN

## 2021-11-19 NOTE — Addendum Note (Signed)
Addended by: Elton Sin on: 11/19/2021 02:28 PM   Modules accepted: Orders

## 2021-11-19 NOTE — Progress Notes (Signed)
Mary [name]    [id number]    08-17-1940  Primary Care Physician:Mary [name]  Referring Physician: Rhea Bleacher, [name],  [place] [address]  Chief complaint:   Follow up for RA-ILD  HPI: 81 y.o.  with history of rheumatoid arthritis, diabetes, gout, hypertension, hyperlipidemia and hypothyroidism. She is being followed by Dr. Estanislado [name] for management of rheumatoid arthritis. She had been previously maintained on methotrexate. She was taken off methotrexate with plans to transition to leflunomide. Before initiation of leflunomide she was hospitalized July 2018 with respiratory failure, bilateral lung infiltrates. CT at that time did not show any pulmonary embolus but there are diffuse ground glass opacities concerning for interstitial lung disease or edema.   She was started on prednisone and diuresed with improvement in symptoms. Bronchoscopy was considered but deferred due to improvement in respiratory status. She has been discharged on a prednisone taper and has followed up with Dr. Estanislado [name] and is currently on [place].  Interim History:. Continues on Arava and Plaquenil per Dr. Estanislado [name] States that breathing is stable.  Has occasional chest congestion and wheezing Currently on DuoNebs.  She cannot use an inhaler due to problems with using the inhaler with arthritis of the hand.  Outpatient Encounter Medications as of 11/19/2021  Medication Sig   acetaminophen (TYLENOL) 500 MG tablet as needed.    albuterol (PROVENTIL) (2.5 MG/3ML) 0.083% nebulizer solution Take 3 mLs (2.5 mg total) by nebulization every 6 (six) hours as needed for wheezing or shortness of breath.   allopurinol (ZYLOPRIM) 100 MG tablet Take 200 mg by mouth daily.    amLODipine (NORVASC) 2.5 MG tablet Take 2.5 mg by mouth daily.   atorvastatin (LIPITOR) 20 MG tablet SMARTSIG:1 Tablet(s) By Mouth Every Evening   Atorvastatin Calcium (LIPITOR PO) Take by mouth daily.   benazepril  (LOTENSIN) 20 MG tablet Take 1 tablet (20 mg total) by mouth daily.   benzonatate (TESSALON) 200 MG capsule Take 200 mg by mouth 3 (three) times daily as needed for cough.   carvedilol (COREG) 6.25 MG tablet    Cholecalciferol (VITAMIN D3) 5000 units TABS Take 1 tablet by mouth. Takes on Monday, Wednesday, and Friday.   clorazepate (TRANXENE) 7.5 MG tablet Take 7.5 mg by mouth 2 (two) times daily.    hydroxychloroquine (PLAQUENIL) 200 MG tablet TAKE 1 TABLET BY MOUTH TWICE DAILY, ON MONDAY THROUGH FRIDAY ONLY.   ipratropium-albuterol (DUONEB) 0.5-2.5 (3) MG/3ML SOLN USE 1 VIAL VIA NEBULIZER 4 TIMES DAILY AS DIRECTED.   leflunomide (ARAVA) 20 MG tablet TAKE ONE (1) TABLET BY MOUTH EVERY DAY   levothyroxine (SYNTHROID, LEVOTHROID) 75 MCG tablet Take 75 mcg by mouth daily.    metFORMIN (GLUCOPHAGE-XR) 500 MG 24 hr tablet Take 500 mg by mouth daily.   No facility-administered encounter medications on file as of 11/19/2021.   Physical Exam: Blood pressure 138/70, pulse 84, temperature 98.2 F (36.8 C), temperature source Oral, height 5' (1.524 m), SpO2 92 %. Gen:      No acute distress HEENT:  EOMI, sclera anicteric Neck:     No masses; no thyromegaly Lungs:    Clear to auscultation bilaterally; normal respiratory effort CV:         Regular rate and rhythm; no murmurs Abd:      + bowel sounds; soft, non-tender; no palpable masses, no distension Ext:    No edema; adequate peripheral perfusion Skin:      Warm and  dry; no rash Neuro: alert and oriented x 3 Psych: normal mood and affect   Data Reviewed: Imaging CTA chest 09/03/16- Negative for pulmonary embolus. Extensive bilateral ground-glass opacities, bilateral hilar and mediastinal lymph nodes. Calcific aortic and coronary atherosclerosis.  Hiatal hernia.  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 High-resolution CT 11/25/2020-stable UIP pattern pulmonary fibrosis I have reviewed the images personally.  PFTs PFTs-unable  to be obtained on multiple attempts  FENO 05/12/17-47  Labs Alpha-1 antitrypsin 08/17/2017- 159, PI MM CBC 08/17/2017-WBC 7, eos 6%, absolute eosinophil count 420 Blood allergy profile 08/17/2017- IgE 10, RAST panel negative  Assessment:  Interstitial lung disease, presumed RA ILD Treated for an exacerbation in 2018 with steroids. Continues on [place] per rheumatology.  CT reviewed with UIP fibrosis.  Clinically she appears stable and on comparison with CT in 2018 there appears to be honeycombing even then.   We had discussed antifibrotics in the past but she was not interested in therapy  Mild persistent asthma As the prednisone was tapered off in 2018 she has developed some wheezing.  Unable to obtain PFTs due to inability to follow instructions.  Has mild elevation in FENO and peripheral eosinophilia.  She was temporarily on [place] and Pulmicort.  Now off them as her breathing has continued to improve Continue duo nebs as needed.  Health maintenance Give flu vaccine today 09/04/2015- Pneumovax.  Plan/Recommendations: - High-res CT in 6 months - Continue duo nebs as needed  Mary Garfinkel MD [facility] 11/19/2021, 1:52 PM  CC: Mary Bleacher, [name]

## 2021-11-19 NOTE — Patient Instructions (Signed)
I am glad you are stable with regard to the breathing Continue the DuoNebs.  We will send in a refill for the medication We will order high-res CT in 6 months and follow-up in clinic in 6 months after CT scan.

## 2022-01-13 ENCOUNTER — Other Ambulatory Visit: Payer: Self-pay | Admitting: *Deleted

## 2022-01-13 MED ORDER — HYDROXYCHLOROQUINE SULFATE 200 MG PO TABS: ORAL_TABLET | ORAL | 0 refills | Status: DC

## 2022-01-13 NOTE — Telephone Encounter (Signed)
Next Visit: 03/25/2022  Last Visit: 10/22/2021  Labs: 10/10/2021 RBC 3.58  Hgb 10.4  MCHC 31.0  RDW 19.2  MPV 19.2  EO# 0.6 09/17/2021 Glucose is elevated-236. Rest of CMP WNL.  Anemia is table.  Absolute eosinophils are chronically elevated. WBC count is WNL  Eye exam: 04/23/2021   Current Dose per office note 10/22/2021: plaquenil 200 mg 1 tablet by mouth twice daily M-F   UE:[id number] arthritis of multiple sites with negative rheumatoid factor   Last Fill: 10/17/2021  Okay to refill Plaquenil?

## 2022-01-20 ENCOUNTER — Other Ambulatory Visit: Payer: Self-pay | Admitting: *Deleted

## 2022-01-20 ENCOUNTER — Other Ambulatory Visit: Payer: Self-pay | Admitting: Physician Assistant

## 2022-01-20 DIAGNOSIS — Z79899 Other long term (current) drug therapy: Secondary | ICD-10-CM

## 2022-01-20 MED ORDER — LEFLUNOMIDE 20 MG PO TABS: ORAL_TABLET | ORAL | 0 refills | Status: DC

## 2022-01-20 NOTE — Telephone Encounter (Signed)
Refill request received via fax from [facility] for Mary [name]   Next Visit: 03/25/2022  Last Visit: 10/22/2021  Last Fill: 10/13/2021  DX: Rheumatoid arthritis of multiple sites with negative rheumatoid factor   Current Dose per office note 10/22/2021: Arava 20 mg 1 tablet daily   Labs: 10/10/2021 RBC 3.58  Hgb 10.4  MCHC 31.0  RDW 19.2  MPV 19.2  EO# 0.6  Patient advised she is due to update her labs. Patient states she will update them next week.   Okay to refill Arava?

## 2022-01-26 ENCOUNTER — Other Ambulatory Visit: Payer: Self-pay | Admitting: *Deleted

## 2022-01-26 DIAGNOSIS — Z79899 Other long term (current) drug therapy: Secondary | ICD-10-CM

## 2022-01-27 DIAGNOSIS — E119 Type 2 diabetes mellitus without complications: Secondary | ICD-10-CM | POA: Insufficient documentation

## 2022-01-28 LAB — CBC WITH DIFFERENTIAL/PLATELET
Basophils Absolute: 0.1 10*3/uL (ref 0.0–0.2)
Basos: 1 %
EOS (ABSOLUTE): 0.6 10*3/uL — ABNORMAL HIGH (ref 0.0–0.4)
Eos: 8 %
Hematocrit: 30 % — ABNORMAL LOW (ref 34.0–46.6)
Hemoglobin: 9.9 g/dL — ABNORMAL LOW (ref 11.1–15.9)
Immature Grans (Abs): 0 10*3/uL (ref 0.0–0.1)
Immature Granulocytes: 0 %
Lymphocytes Absolute: 1.4 10*3/uL (ref 0.7–3.1)
Lymphs: 18 %
MCH: 28.9 pg (ref 26.6–33.0)
MCHC: 33 g/dL (ref 31.5–35.7)
MCV: 88 fL (ref 79–97)
Monocytes Absolute: 0.5 10*3/uL (ref 0.1–0.9)
Monocytes: 7 %
Neutrophils Absolute: 5.2 10*3/uL (ref 1.4–7.0)
Neutrophils: 66 %
Platelets: 212 10*3/uL (ref 150–450)
RBC: 3.43 x10E6/uL — ABNORMAL LOW (ref 3.77–5.28)
RDW: 15.1 % (ref 11.7–15.4)
WBC: 7.8 10*3/uL (ref 3.4–10.8)

## 2022-01-28 LAB — CMP14+EGFR
ALT: 6 IU/L (ref 0–32)
AST: 11 IU/L (ref 0–40)
Albumin/Globulin Ratio: 1.4 (ref 1.2–2.2)
Albumin: 4.1 g/dL (ref 3.8–4.8)
Alkaline Phosphatase: 91 IU/L (ref 44–121)
BUN/Creatinine Ratio: 32 — ABNORMAL HIGH (ref 12–28)
BUN: 25 mg/dL (ref 8–27)
Bilirubin Total: <0.2 mg/dL (ref 0.0–1.2)
CO2: 19 mmol/L — ABNORMAL LOW (ref 20–29)
Calcium: 9.2 mg/dL (ref 8.7–10.3)
Chloride: 106 mmol/L (ref 96–106)
Creatinine, Ser: 0.79 mg/dL (ref 0.57–1.00)
Globulin, Total: 2.9 g/dL (ref 1.5–4.5)
Glucose: 160 mg/dL — ABNORMAL HIGH (ref 70–99)
Potassium: 4.4 mmol/L (ref 3.5–5.2)
Sodium: 142 mmol/L (ref 134–144)
Total Protein: 7 g/dL (ref 6.0–8.5)
eGFR: 76 mL/min/{1.73_m2} (ref >59)

## 2022-01-28 NOTE — Progress Notes (Signed)
Hemoglobin is low at 9.9.  Glucose is elevated at 160.  Please notify patient and  forward results to her PCP.  She may need further work-up for the evaluation of anemia to her PCP.

## 2022-02-25 ENCOUNTER — Other Ambulatory Visit: Payer: Self-pay | Admitting: *Deleted

## 2022-02-25 MED ORDER — LEFLUNOMIDE 20 MG PO TABS: ORAL_TABLET | ORAL | 0 refills | Status: DC

## 2022-02-25 NOTE — Telephone Encounter (Signed)
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  Glucose is elevated at 160.  Please notify patient and  forward results to her PCP.  She may need further work-up for the evaluation of anemia to her PCP.   Okay to refill Arava?

## 2022-03-11 NOTE — Progress Notes (Signed)
Office Visit Note  Patient: Mary [name]             Date of Birth: 1940-11-14           MRN: [id number]             PCP: Rhea Bleacher, NP Referring: Rhea Bleacher, NP Visit Date: 03/25/2022 Occupation: @GUAROCC @  Subjective:  (Right foot pain)   History of Present Illness: Pamela Intrieri is a 82 y.o. female history of seronegative rheumatoid arthritis, osteoarthritis, gouty arthropathy and osteoporosis.  She states for the last 2 weeks she has been having pain and discomfort in her right big toe.  She also has pain in the other toes and the midfoot.  She states the symptoms are more intense and they are gradually improving but it is still painful.  None of the other joints are painful.  She has been taking allopurinol 200 mg daily without interruption.  She continues to take leflunomide 20 mg daily and hydroxychloroquine 1 tablet bid Monday to Friday.  She denies any injury.  Patient was evaluated by Dr. Vaughan Browner on November 19, 2021.  She has UIP fibrosis.  He plans to repeat CT scan in March 2024.  She also has underlying mild persistent asthma.  Activities of Daily Living:  Patient reports morning stiffness for 5-10 minutes.   Patient Reports nocturnal pain.  Difficulty dressing/grooming: Reports Difficulty climbing stairs: Reports Difficulty getting out of chair: Reports Difficulty using hands for taps, buttons, cutlery, and/or writing: Reports  Review of Systems  Constitutional:  Negative for fatigue.  HENT:  Negative for mouth sores and mouth dryness.   Eyes:  Negative for dryness.  Respiratory:  Negative for shortness of breath.   Cardiovascular:  Negative for chest pain and palpitations.  Gastrointestinal:  Negative for blood in stool, constipation and diarrhea.  Endocrine: Negative for increased urination.  Genitourinary:  Negative for involuntary urination.  Musculoskeletal:  Positive for joint pain, joint pain, joint swelling and morning stiffness. Negative for  gait problem, myalgias, muscle weakness, muscle tenderness and myalgias.  Skin:  Negative for color change, rash, hair loss and sensitivity to sunlight.  Allergic/Immunologic: Negative for susceptible to infections.  Neurological:  Negative for dizziness and headaches.  Hematological:  Negative for swollen glands.  Psychiatric/Behavioral:  Negative for depressed mood and sleep disturbance. The patient is not nervous/anxious.     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[place])    Hypoxia 09/03/2016   Essential hypertension 09/03/2016   Urinary tract infection 09/03/2016   Rheumatoid arthritis of multiple sites with negative rheumatoid factor ([place]) 02/28/2016   High risk medication use 02/28/2016   Idiopathic chronic gout of multiple sites without tophus 02/28/2016   Primary osteoarthritis of both knees 02/28/2016   Flexion contractures 02/28/2016   History of hypothyroidism 02/28/2016   History of diabetes mellitus 02/28/2016   History of shingles 02/28/2016   History of hyperlipidemia 02/28/2016    Past Medical History:  Diagnosis Date   Complication of anesthesia    Diabetes mellitus without complication (HCC)    Dyspnea    Gout    High cholesterol    History of kidney stones    Hypertension    Hypoxia 08/2016   Osteoarthritis    PONV (postoperative nausea and vomiting)  Rheumatoid arthritis (HCC)     Family History  Problem Relation Age of Onset   Arthritis Brother    Arthritis Sister    Arthritis Sister    Arthritis Brother    Arthritis Brother    Arthritis Brother    Past Surgical History:  Procedure Laterality Date   ABDOMINAL HYSTERECTOMY     Social History   Social History Narrative   Not on file   Immunization History  Administered Date(s)  Administered   Fluad Quad(high Dose 65+) 12/01/2018, 11/19/2021   Influenza, High Dose Seasonal PF 11/26/2015   Influenza-Unspecified 12/07/2016   PFIZER(Purple Top)SARS-COV-2 Vaccination 04/07/2019, 04/28/2019   Pneumococcal-Unspecified 09/04/2015     Objective: Vital Signs: BP 138/79 (BP Location: Left Arm, Patient Position: Sitting, Cuff Size: Normal)   Pulse 87    Physical Exam Vitals and nursing note reviewed.  Constitutional:      Appearance: She is well-developed.  HENT:     Head: Normocephalic and atraumatic.  Eyes:     Conjunctiva/sclera: Conjunctivae normal.  Cardiovascular:     Rate and Rhythm: Normal rate and regular rhythm.     Heart sounds: Normal heart sounds.  Pulmonary:     Effort: Pulmonary effort is normal.     Breath sounds: Normal breath sounds.  Abdominal:     General: Bowel sounds are normal.     Palpations: Abdomen is soft.  Musculoskeletal:     Cervical back: Normal range of motion.  Lymphadenopathy:     Cervical: No cervical adenopathy.  Skin:    General: Skin is warm and dry.     Capillary Refill: Capillary refill takes less than 2 seconds.  Neurological:     Mental Status: She is alert and oriented to person, place, and time.  Psychiatric:        Behavior: Behavior normal.      Musculoskeletal Exam: Patient was in the wheelchair.  She had limited range of motion of the cervical spine.  She had thoracic kyphosis.  She had limited range of motion of the shoulder joints with abduction to 110 degrees bilaterally.  She had bilateral elbow joint contractures without synovitis.  She had very limited range of motion of bilateral wrist joints with no synovitis.  She had incomplete fist formation bilaterally with ulnar deviation, MCP PIP and DIP contractures without synovitis.  Hip joints were difficult to assess in the wheelchair.  She had right knee joint replaced which had limited extension.  Left knee joint with also limited extension.  She had  tenderness over right first MTP.  She has severe subluxation of the first MTP joint with overcrowding of the toes.  No warmth or redness was noted.  CDAI Exam: CDAI Score: -- Patient Global: --; Provider Global: -- Swollen: --; Tender: -- Joint Exam 03/25/2022   No joint exam has been documented for this visit   There is currently no information documented on the homunculus. Go to the Rheumatology activity and complete the homunculus joint exam.  Investigation: No additional findings.  Imaging: No results found.  Recent Labs: Lab Results  Component Value Date   WBC 7.8 01/27/2022   HGB 9.9 (L) 01/27/2022   PLT 212 01/27/2022   NA 142 01/27/2022   K 4.4 01/27/2022   CL 106 01/27/2022   CO2 19 (L) 01/27/2022   GLUCOSE 160 (H) 01/27/2022   BUN 25 01/27/2022   CREATININE 0.79 01/27/2022   BILITOT <0.2 01/27/2022   ALKPHOS 91 01/27/2022   AST 11 01/27/2022  ALT 6 01/27/2022   PROT 7.0 01/27/2022   ALBUMIN 4.1 01/27/2022   CALCIUM 9.2 01/27/2022   GFRAA 95 03/26/2020    Speciality Comments: Prior therapy: Plaquenil (d/c per opthamologist recommendation) and SSZ (diarrhea) PLQ Eye Exam: 04/23/2021 WNL [facility] Follow up 1 year  Please scheudle patient's appts. every 3 month per patient request for lab compliance.   Procedures:  No procedures performed Allergies: Aspirin and Codeine   Assessment / Plan:     Visit Diagnoses: Rheumatoid arthritis of multiple sites with negative rheumatoid factor (HCC) - Severe end-stage rheumatoid arthritis with multiple contractures.  She is wheelchair-bound: She has severe arthritis involving multiple joints but she does not have any active swelling.  She has been having discomfort in her right foot which she describes over the right first MTP joint and midfoot.  No warmth or swelling was noted.  She has callus formation and overcrowding of the toes.  She requested a prednisone taper.  I will give her prednisone 15  mg for 2 days, 10 mg for 2 days and 5 mg for 2 days.  Side effects of prednisone including the increased risk of infection, weight gain, osteoporosis, hypertension, diabetes were discussed at length.  If she has persistent symptoms she should notify us.  She has been taking leflunomide 20 mg daily along with Plaquenil 200 mg twice daily Monday to Friday which she is tolerating well.  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 Isaiah Serge on November 19, 2021.  She is a scheduled to have a high-resolution CT.  She denies any increased shortness of breath.  High risk medication use - Arava 20 mg 1 tablet by mouth daily and plaquenil 200 mg 1 tablet by mouth twice daily M-F. PLQ Eye Exam: 04/23/2021.  Labs obtained on January 27, 2022 were reviewed.  Her hemoglobin was low at 9.9.  LFTs and creatinine were normal.  She is advised to get repeat labs in February and every 3 months.  Information regarding utilization was placed in the AVS.  She was also advised to stop leflunomide if she develops an infection.  Primary osteoarthritis of right knee - Flexion contracture.  Wheelchair bound.  She has flexion contracture in bilateral knee joints.  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  Pain in right foot-she complains of pain to his right foot pain for the last 2 weeks.  Callus formation was noted under her metatarsals.  She has overcrowding of the toes and subluxation of the toes.  No synovitis was noted.  Will send prednisone taper as mentioned above.  Idiopathic chronic gout of multiple sites without tophus - allopurinol 200 mg daily for management of gout.  Uric acid was 6.2 on 07/17/2020.  Will repeat uric acid level with her next labs in February.  Age-related osteoporosis without current pathological fracture - DEXA on 06/18/2020 Right femoral neck BMD 0.593 with T score -2.3.  Use of calcium with vitamin D was  discussed.  She will get repeat DEXA scan this year.  Flexion contractures - End stage rheumatoid arthritis. Flexion contracture ~10 degrees in both elbows.  Flexion contracture of both knees.  History of diabetes mellitus-she was advised to her to blood glucose level closely on prednisone.  History of hyperlipidemia  History of kidney stones  Essential hypertension-blood pressure was normal at 138/79.  She was advised to monitor blood pressure closely on prednisone.  History of  shingles  History of hypothyroidism  Orders: No orders of the defined types were placed in this encounter.  Meds ordered this encounter  Medications   predniSONE (DELTASONE) 5 MG tablet    Sig: Take 3 tabs po qd x 2 days, 2  tabs po qd x 2 days, 1  tabs po qd x 2 days.    Dispense:  12 tablet    Refill:  0     Follow-Up Instructions: Return in about 3 months (around 06/24/2022) for Rheumatoid arthritis, Osteoarthritis, Osteoporosis, Gout, ILD.   Bo Merino, MD  Note - This record has been created using Editor, commissioning.  Chart creation errors have been sought, but may not always  have been located. Such creation errors do not reflect on  the standard of medical care.

## 2022-03-25 ENCOUNTER — Ambulatory Visit: Payer: Medicare HMO | Attending: Rheumatology | Admitting: Rheumatology

## 2022-03-25 ENCOUNTER — Encounter: Payer: Self-pay | Admitting: Rheumatology

## 2022-03-25 VITALS — BP 138/79 | HR 87

## 2022-03-25 DIAGNOSIS — Z96652 Presence of left artificial knee joint: Secondary | ICD-10-CM

## 2022-03-25 DIAGNOSIS — J849 Interstitial pulmonary disease, unspecified: Secondary | ICD-10-CM

## 2022-03-25 DIAGNOSIS — Z87442 Personal history of urinary calculi: Secondary | ICD-10-CM

## 2022-03-25 DIAGNOSIS — Z79899 Other long term (current) drug therapy: Secondary | ICD-10-CM

## 2022-03-25 DIAGNOSIS — M0609 Rheumatoid arthritis without rheumatoid factor, multiple sites: Secondary | ICD-10-CM | POA: Diagnosis not present

## 2022-03-25 DIAGNOSIS — M1A09X0 Idiopathic chronic gout, multiple sites, without tophus (tophi): Secondary | ICD-10-CM

## 2022-03-25 DIAGNOSIS — M2450 Contracture, unspecified joint: Secondary | ICD-10-CM

## 2022-03-25 DIAGNOSIS — Z8639 Personal history of other endocrine, nutritional and metabolic disease: Secondary | ICD-10-CM

## 2022-03-25 DIAGNOSIS — M79671 Pain in right foot: Secondary | ICD-10-CM

## 2022-03-25 DIAGNOSIS — M1711 Unilateral primary osteoarthritis, right knee: Secondary | ICD-10-CM

## 2022-03-25 DIAGNOSIS — Z8619 Personal history of other infectious and parasitic diseases: Secondary | ICD-10-CM

## 2022-03-25 DIAGNOSIS — M810 Age-related osteoporosis without current pathological fracture: Secondary | ICD-10-CM

## 2022-03-25 DIAGNOSIS — I10 Essential (primary) hypertension: Secondary | ICD-10-CM

## 2022-03-25 MED ORDER — PREDNISONE 5 MG PO TABS: ORAL_TABLET | ORAL | 0 refills | Status: DC

## 2022-03-25 NOTE — Patient Instructions (Signed)
Standing Labs We placed an order today for your standing lab work.   Please have your standing labs drawn in February and every 3 months Please get uric acid level with your next labs  Please have your labs drawn 2 weeks prior to your appointment so that the provider can discuss your lab results at your appointment.  Please note that you may see your imaging and lab results in [place] before we have reviewed them. We will contact you once all results are reviewed. Please allow our office up to 72 hours to thoroughly review all of the results before contacting the office for clarification of your results.  Lab hours are:   Monday through Thursday from 8:00 am -12:30 pm and 1:00 pm-5:00 pm and Friday from 8:00 am-12:00 pm.  Please be advised, all patients with office appointments requiring lab work will take precedent over walk-in lab work.   Labs are drawn by [facility]. Please bring your co-pay at the time of your lab draw.  You may receive a bill from [facility] for your lab work.  Please note if you are on Hydroxychloroquine and and an order has been placed for a Hydroxychloroquine level, you will need to have it drawn 4 hours or more after your last dose.  If you wish to have your labs drawn at another location, please call the office 24 hours in advance so we can fax the orders.  The office is located at [address] [place], [place], [place] [address] No appointment is necessary.    If you have any questions regarding directions or hours of operation,  please call [phone].   As a reminder, please drink plenty of water prior to coming for your lab work. Thanks!   Vaccines You are taking a medication(s) that can suppress your immune system.  The following immunizations are recommended: Flu annually Covid-19  RSV Td/Tdap (tetanus, diphtheria, pertussis) every 10 years Pneumonia (Prevnar 15 then Pneumovax 23 at least 1 year apart.  Alternatively, can take Prevnar 20 without  needing additional dose) Shingrix: 2 doses from 4 weeks to 6 months apart  Please check with your PCP to make sure you are up to date.   If you have signs or symptoms of an infection or start antibiotics: First, call your PCP for workup of your infection. Hold your medication through the infection, until you complete your antibiotics, and until symptoms resolve if you take the following: Injectable medication (Actemra, Benlysta, Cimzia, Cosentyx, Enbrel, Humira, Kevzara, Orencia, Remicade, Simponi, Stelara, Taltz, Tremfya) Methotrexate Leflunomide (Arava) Mycophenolate (Cellcept) Morrie Sheldon, Olumiant, or Rinvoq

## 2022-04-07 ENCOUNTER — Telehealth: Payer: Self-pay

## 2022-04-07 ENCOUNTER — Other Ambulatory Visit: Payer: Self-pay | Admitting: Physician Assistant

## 2022-04-07 DIAGNOSIS — M1711 Unilateral primary osteoarthritis, right knee: Secondary | ICD-10-CM

## 2022-04-07 DIAGNOSIS — M79671 Pain in right foot: Secondary | ICD-10-CM

## 2022-04-07 DIAGNOSIS — M1A09X0 Idiopathic chronic gout, multiple sites, without tophus (tophi): Secondary | ICD-10-CM

## 2022-04-07 DIAGNOSIS — M0609 Rheumatoid arthritis without rheumatoid factor, multiple sites: Secondary | ICD-10-CM

## 2022-04-07 DIAGNOSIS — Z79899 Other long term (current) drug therapy: Secondary | ICD-10-CM

## 2022-04-07 NOTE — Telephone Encounter (Signed)
Next Visit: 06/24/2022  Last Visit: 03/25/2022  Labs: 01/27/2022 Hemoglobin is low at 9.9.  Glucose is elevated at 160.   Eye exam: 04/23/2021 WNL   Current Dose per office note 03/25/2022: plaquenil 200 mg 1 tablet by mouth twice daily M-F.   JO:ITGPQDIYME arthritis of multiple sites with negative rheumatoid factor   Last Fill: 01/13/2022  Okay to refill Plaquenil?

## 2022-04-07 NOTE — Telephone Encounter (Signed)
Advised patient's daughter Larene Beach that patient is due for repeat eye exam. She verbalized understanding and will schedule.   Okay to refill PLQ?

## 2022-04-07 NOTE — Telephone Encounter (Signed)
Spoke with patient's daughter Larene Beach and advised per Dr. Bronson Curb,  I did not notice any joint inflammation during the last visit.  She had calluses under her feet.  I would recommend getting CBC, CMP, sed rate and uric acid level.  I would also advise an appointment with a podiatrist and a referral to pain management.  Patient currently has a podiatrist in [place], the daughter will call to make an appointment. She was in agreement for the labs and pain management referral. Labs were placed and released for [facility] and referral placed for pain management.

## 2022-04-07 NOTE — Telephone Encounter (Signed)
I did not notice any joint inflammation during the last visit.  She had calluses under her feet.  I would recommend getting CBC, CMP, sed rate and uric acid level.  I would also advise an appointment with a podiatrist and a referral to pain management.

## 2022-04-07 NOTE — Telephone Encounter (Signed)
Spoke with patient's daughter and she states patient is having extreme pain in her feet along with some swelling. The pain is described as sharp and has now moved into her knees. Patient's daughter states this is the same complaint the patient had at her visit on 03/25/2022 where she was prescribed a short prednisone taper. She reports that the prednisone taper did help during the few days she was on it. Patient has been taking tylenol for the pain. Patient's daughter would like to know if there are any options for better pain control for the patient. She is taking plaquenil and arava as prescribed. Please advise.

## 2022-04-07 NOTE — Telephone Encounter (Signed)
Please advise patient to get repeat eye examination.

## 2022-04-08 ENCOUNTER — Encounter: Payer: Self-pay | Admitting: Rheumatology

## 2022-04-08 NOTE — Telephone Encounter (Signed)
PLQ eye exam is due 04/25/2022. Please advise.

## 2022-04-11 LAB — CBC WITH DIFFERENTIAL/PLATELET
Basophils Absolute: 0.1 10*3/uL (ref 0.0–0.2)
Basos: 1 %
EOS (ABSOLUTE): 0.4 10*3/uL (ref 0.0–0.4)
Eos: 7 %
Hematocrit: 31.2 % — ABNORMAL LOW (ref 34.0–46.6)
Hemoglobin: 9.8 g/dL — ABNORMAL LOW (ref 11.1–15.9)
Immature Grans (Abs): 0 10*3/uL (ref 0.0–0.1)
Immature Granulocytes: 0 %
Lymphocytes Absolute: 1 10*3/uL (ref 0.7–3.1)
Lymphs: 17 %
MCH: 27.8 pg (ref 26.6–33.0)
MCHC: 31.4 g/dL — ABNORMAL LOW (ref 31.5–35.7)
MCV: 89 fL (ref 79–97)
Monocytes Absolute: 0.5 10*3/uL (ref 0.1–0.9)
Monocytes: 9 %
Neutrophils Absolute: 4 10*3/uL (ref 1.4–7.0)
Neutrophils: 66 %
Platelets: 191 10*3/uL (ref 150–450)
RBC: 3.52 x10E6/uL — ABNORMAL LOW (ref 3.77–5.28)
RDW: 16.1 % — ABNORMAL HIGH (ref 11.7–15.4)
WBC: 6.1 10*3/uL (ref 3.4–10.8)

## 2022-04-11 LAB — CMP14+EGFR
ALT: 8 IU/L (ref 0–32)
AST: 14 IU/L (ref 0–40)
Albumin/Globulin Ratio: 1.5 (ref 1.2–2.2)
Albumin: 4 g/dL (ref 3.8–4.8)
Alkaline Phosphatase: 87 IU/L (ref 44–121)
BUN/Creatinine Ratio: 36 — ABNORMAL HIGH (ref 12–28)
BUN: 24 mg/dL (ref 8–27)
Bilirubin Total: <0.2 mg/dL (ref 0.0–1.2)
CO2: 18 mmol/L — ABNORMAL LOW (ref 20–29)
Calcium: 9.2 mg/dL (ref 8.7–10.3)
Chloride: 104 mmol/L (ref 96–106)
Creatinine, Ser: 0.67 mg/dL (ref 0.57–1.00)
Globulin, Total: 2.7 g/dL (ref 1.5–4.5)
Glucose: 122 mg/dL — ABNORMAL HIGH (ref 70–99)
Potassium: 4.8 mmol/L (ref 3.5–5.2)
Sodium: 138 mmol/L (ref 134–144)
Total Protein: 6.7 g/dL (ref 6.0–8.5)
eGFR: 89 mL/min/{1.73_m2} (ref >59)

## 2022-04-11 LAB — URIC ACID: Uric Acid: 4.9 mg/dL (ref 3.1–7.9)

## 2022-04-11 LAB — SEDIMENTATION RATE: Sed Rate: 64 mm/hr — ABNORMAL HIGH (ref 0–40)

## 2022-04-12 NOTE — Progress Notes (Signed)
CBC shows low hemoglobin which is a stable.  Sedimentation rate is still elevated at 64.  CMP is stable.  Uric acid is 4.9 which is in the desirable range.  Please forward results to her PCP.

## 2022-04-16 ENCOUNTER — Encounter: Payer: Self-pay | Admitting: Physical Medicine and Rehabilitation

## 2022-04-20 ENCOUNTER — Other Ambulatory Visit: Payer: Self-pay | Admitting: *Deleted

## 2022-04-20 MED ORDER — LEFLUNOMIDE 20 MG PO TABS: ORAL_TABLET | ORAL | 0 refills | Status: DC

## 2022-04-20 NOTE — Telephone Encounter (Signed)
Refill request received via fax from [place] for [facility]  Next Visit: 06/24/2022  Last Visit: 03/25/2022  Last Fill: 02/25/2022  DX: Rheumatoid arthritis of multiple sites with negative rheumatoid factor   Current Dose per office note 03/25/2022: Arava 20 mg 1 tablet by mouth daily   Labs: 04/10/2022 CBC shows low hemoglobin which is a stable.  CMP is stable.   Okay to refill Arava?

## 2022-05-14 ENCOUNTER — Other Ambulatory Visit: Payer: Medicare HMO

## 2022-05-19 ENCOUNTER — Ambulatory Visit
Admission: RE | Admit: 2022-05-19 | Discharge: 2022-05-19 | Disposition: A | Discharge status: Home / Self Care | Payer: Medicare HMO | Source: Ambulatory Visit | Attending: Pulmonary Disease | Admitting: Pulmonary Disease

## 2022-05-19 DIAGNOSIS — J849 Interstitial pulmonary disease, unspecified: Secondary | ICD-10-CM

## 2022-06-11 NOTE — Progress Notes (Signed)
Office Visit Note  Patient: Mary [name]             Date of Birth: 01-Apr-1940           MRN: [id number]             PCP: Erskine Emery, NP Referring: Erskine Emery, NP Visit Date: 06/24/2022 Occupation: @GUAROCC @  Subjective:  Chronic pain   History of Present Illness: Mary [name] is a 82 y.o. female with history of seronegative rheumatoid arthritis, osteoarthritis, gout, and osteoporosis.  Patient remains on Arava 20 mg 1 tablet by mouth daily and plaquenil 200 mg 1 tablet by mouth twice daily M-F.  She is tolerating combination therapy without any side effects and has not missed any doses recently.  She is scheduled updated plaquenil eye examination in June 2024.  Patient reports that she has been evaluated by Dr. Berline Chough for pain management.  She was given a prescription for tramadol but has not yet started due to being apprehensive of possible side effects.  She has been taking Tylenol twice daily for pain relief.  She continues to have chronic pain and stiffness in both shoulders, both hands, and both feet.  She has neuropathy in both feet which contribute to her symptoms.   Activities of Daily Living:  Patient reports morning stiffness for 0 minutes.   Patient Reports nocturnal pain.  Difficulty dressing/grooming: Reports Difficulty climbing stairs: Reports Difficulty getting out of chair: Reports Difficulty using hands for taps, buttons, cutlery, and/or writing: Reports  Review of Systems  Constitutional:  Negative for fatigue.  HENT:  Negative for mouth sores and mouth dryness.   Eyes:  Negative for dryness.  Respiratory:  Negative for shortness of breath.   Cardiovascular:  Negative for chest pain and palpitations.  Gastrointestinal:  Positive for constipation. Negative for blood in stool and diarrhea.  Endocrine: Positive for increased urination.  Genitourinary:  Negative for involuntary urination.  Musculoskeletal:  Positive for joint pain, joint pain and  muscle tenderness. Negative for gait problem, joint swelling, myalgias, muscle weakness, morning stiffness and myalgias.  Skin:  Negative for color change, rash, hair loss and sensitivity to sunlight.  Allergic/Immunologic: Negative for susceptible to infections.  Neurological:  Negative for dizziness and headaches.  Hematological:  Negative for swollen glands.  Psychiatric/Behavioral:  Negative for depressed mood and sleep disturbance. The patient is nervous/anxious.     PMFS History:  Patient Active Problem List   Diagnosis Date Noted   Wheelchair dependent 06/17/2022   Mild persistent asthma without complication 11/18/2017   Age-related osteoporosis without current pathological fracture 11/19/2016   Acute respiratory failure    Rheumatoid lung    Hypoxia 09/03/2016   Essential hypertension 09/03/2016   Urinary tract infection 09/03/2016   Rheumatoid arthritis of multiple sites with negative rheumatoid factor 02/28/2016   High risk medication use 02/28/2016   Idiopathic chronic gout of multiple sites without tophus 02/28/2016   Primary osteoarthritis of both knees 02/28/2016   Contracture, joint, multiple sites 02/28/2016   History of hypothyroidism 02/28/2016   History of diabetes mellitus 02/28/2016   History of shingles 02/28/2016   History of hyperlipidemia 02/28/2016    Past Medical History:  Diagnosis Date   Complication of anesthesia    Diabetes mellitus without complication    Dyspnea    Gout    High cholesterol    History of kidney stones    Hypertension    Hypoxia 08/2016   Osteoarthritis    PONV (postoperative  nausea and vomiting)    Rheumatoid arthritis     Family History  Problem Relation Age of Onset   Arthritis Brother    Arthritis Sister    Arthritis Sister    Arthritis Brother    Arthritis Brother    Arthritis Brother    Past Surgical History:  Procedure Laterality Date   ABDOMINAL HYSTERECTOMY     Social History   Social History Narrative    Not on file   Immunization History  Administered Date(s) Administered   Fluad Quad(high Dose 65+) 12/01/2018, 11/19/2021   Influenza, High Dose Seasonal PF 11/26/2015   Influenza-Unspecified 12/07/2016   PFIZER(Purple Top)SARS-COV-2 Vaccination 04/07/2019, 04/28/2019   Pneumococcal-Unspecified 09/04/2015     Objective: Vital Signs: BP (!) 103/58 (BP Location: Left Arm, Patient Position: Sitting, Cuff Size: Small)   Pulse 82   Resp 14   Ht 5\' 4"  (1.626 m)   BMI 25.75 kg/m    Physical Exam Vitals and nursing note reviewed.  Constitutional:      Appearance: She is well-developed.  HENT:     Head: Normocephalic and atraumatic.  Eyes:     Conjunctiva/sclera: Conjunctivae normal.  Cardiovascular:     Rate and Rhythm: Normal rate and regular rhythm.     Heart sounds: Normal heart sounds.  Pulmonary:     Effort: Pulmonary effort is normal.     Breath sounds: Normal breath sounds.  Abdominal:     General: Bowel sounds are normal.     Palpations: Abdomen is soft.  Musculoskeletal:     Cervical back: Normal range of motion.  Lymphadenopathy:     Cervical: No cervical adenopathy.  Skin:    General: Skin is warm and dry.     Capillary Refill: Capillary refill takes less than 2 seconds.  Neurological:     Mental Status: She is alert and oriented to person, place, and time.  Psychiatric:        Behavior: Behavior normal.      Musculoskeletal Exam: Patient remained seated in the wheelchair during the examination today.  C-spine has limited range of motion.  Thoracic kyphosis noted.  Limited shoulder abduction to about 100 degrees bilaterally.  Bilateral elbow joint contractures noted.  Severely limited range of motion of both wrist joints.  Incomplete fist formation noted bilaterally.  Ulnar deviation noted bilaterally.  Contractures of MCP and PIP joints, right hand worse than left.  Hip joints difficult to assess in seated position.  Right knee is limited extension.  Left knee  replacement has limited extension.  Subluxation of both ankles.  Overcrowding of toes  CDAI Exam: CDAI Score: -- Patient Global: 7 mm; Provider Global: 5 mm Swollen: --; Tender: -- Joint Exam 06/24/2022   No joint exam has been documented for this visit   There is currently no information documented on the homunculus. Go to the Rheumatology activity and complete the homunculus joint exam.  Investigation: No additional findings.  Imaging: No results found.  Recent Labs: Lab Results  Component Value Date   WBC 6.1 04/10/2022   HGB 9.8 (L) 04/10/2022   PLT 191 04/10/2022   NA 138 04/10/2022   K 4.8 04/10/2022   CL 104 04/10/2022   CO2 18 (L) 04/10/2022   GLUCOSE 122 (H) 04/10/2022   BUN 24 04/10/2022   CREATININE 0.67 04/10/2022   BILITOT <0.2 04/10/2022   ALKPHOS 87 04/10/2022   AST 14 04/10/2022   ALT 8 04/10/2022   PROT 6.7 04/10/2022   ALBUMIN 4.0  04/10/2022   CALCIUM 9.2 04/10/2022   GFRAA 95 03/26/2020    Speciality Comments: Prior therapy: Plaquenil (d/c per opthamologist recommendation) and SSZ (diarrhea) PLQ Eye Exam: 04/23/2021 WNL [facility] [place] Follow up 1 year  Please scheudle patient's appts. every 3 month per patient request for lab compliance.   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  She is wheelchair-bound: Patient's rheumatoid arthritis remains stable on the current treatment regimen of Areva 20 mg 1 tablet by mouth daily and Plaquenil 200 mg 1 tablet by mouth twice daily Monday through Friday.  She is tolerating combination therapy without any side effects and has not missed any doses recently.  She remains wheelchair-bound and continues to have pain and stiffness in both shoulders, both hands, and both feet.  Her pain is  most severe in her right foot due to overcrowding of her toes as well as callus formation.  She is wearing diabetic shoe and sees podiatry every 6 months.  She has established care with Dr. Berline Chough for pain management and has received a prescription for tramadol but has not yet started.  Discussed the possible risks and benefits of tramadol use.  She was encouraged to try taking tramadol 50 mg 1 tablet at bedtime as needed for pain relief.  As of now she has been taking Tylenol twice a day.  Discussed the importance of close lab monitoring.  CBC and CMP were checked today.  She will remain on Plaquenil and Arava as prescribed.  She does not need any refills at this time.  She was advised to notify us if she develops signs or symptoms of a flare.  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 Isaiah Serge on November 19, 2021.  Unable to obtain PFTs in the past.  The use of antifibrotic agents have been discussed in the past but she was apprehensive of possible side effects including GI upset so she declined treatment in the past.  Crackles auscultated in bilateral lungs.  Upcoming appointment scheduled with Dr. Isaiah Serge on 06/26/2022.  High risk medication use - Arava 20 mg 1 tablet by mouth daily and plaquenil 200 mg 1 tablet by mouth twice daily M-F.  CBC and CMP updated on 04/10/22  PLQ Eye Exam: 04/23/2021 [facility] [place] Follow up 1 year.  Scheduled to update plaquenil eye examination in June 2024.  Patient was given a plaque on examination to take with her to her upcoming appointment.  - Plan: CBC with Differential/Platelet, COMPLETE METABOLIC PANEL WITH GFR  Idiopathic chronic gout of multiple sites without tophus -She has not allopurinol 200 mg daily for management of gout. uric acid: 4.9 on 04/10/2022.  No medication changes will be made at this time.  Primary osteoarthritis of right knee - Flexion contracture.  Wheelchair bound.  She has flexion  contracture in bilateral knee joints.  Established care with Dr. Berline Chough for pain management.  Patient was given a prescription for tramadol 50 mg 1 tablet 3 times daily as needed for pain relief.  She has not yet tried tramadol due to being apprehensive possible side effects.  Reviewed possible adverse effects today in detail.  Patient was encouraged to try taking tramadol 1 tablet at bedtime to see if she tolerates it.  S/P TKR (  total knee replacement), left: Doing well.  Pain in right foot - Callus formation was noted under her metatarsals.  She has overcrowding of the toes and subluxation of the toes.  She has chronic pain in both feet especially in her right foot.  She is primarily wheelchair-bound and is wearing diabetic shoes.  She has establish care with pain management and has been given a prescription for tramadol but has not yet initiated therapy.  Age-related osteoporosis without current pathological fracture - DEXA on 06/18/2020 Right femoral neck BMD 0.593 with T score -2.3.  Patient has declined to schedule updated bone density at this time.  It is difficult for her to perform the bone scan due to chronic pain and contractures involving multiple joints.  She is primarily wheelchair-bound. She is taking vitamin D 5000 units 3 days a week.  Flexion contractures - End stage rheumatoid arthritis. Flexion contracture ~10 degrees in both elbows.  Flexion contracture of both knees.  Patient is primarily wheelchair-bound.  Other medical conditions are listed as follows:  History of diabetes mellitus  History of kidney stones  History of hyperlipidemia  History of shingles  Essential hypertension: Blood pressure was 103/58 today in the office.  History of hypothyroidism  Orders: Orders Placed This Encounter  Procedures   CBC with Differential/Platelet   COMPLETE METABOLIC PANEL WITH GFR   No orders of the defined types were placed in this encounter.    Follow-Up Instructions:  Return in about 3 months (around 09/23/2022) for Rheumatoid arthritis, Osteoarthritis, Gout, Osteoporosis.   Gearldine Bienenstock, PA-C  Note - This record has been created using Dragon software.  Chart creation errors have been sought, but may not always  have been located. Such creation errors do not reflect on  the standard of medical care.

## 2022-06-17 ENCOUNTER — Encounter: Payer: Medicare HMO | Attending: Physical Medicine and Rehabilitation | Admitting: Physical Medicine and Rehabilitation

## 2022-06-17 ENCOUNTER — Encounter: Payer: Self-pay | Admitting: Physical Medicine and Rehabilitation

## 2022-06-17 ENCOUNTER — Telehealth: Payer: Self-pay

## 2022-06-17 VITALS — BP 136/82 | HR 86 | Ht 60.0 in | Wt 150.0 lb

## 2022-06-17 DIAGNOSIS — M0609 Rheumatoid arthritis without rheumatoid factor, multiple sites: Secondary | ICD-10-CM | POA: Diagnosis not present

## 2022-06-17 DIAGNOSIS — M170 Bilateral primary osteoarthritis of knee: Secondary | ICD-10-CM

## 2022-06-17 DIAGNOSIS — G894 Chronic pain syndrome: Secondary | ICD-10-CM | POA: Diagnosis present

## 2022-06-17 DIAGNOSIS — Z79891 Long term (current) use of opiate analgesic: Secondary | ICD-10-CM

## 2022-06-17 DIAGNOSIS — M2450 Contracture, unspecified joint: Secondary | ICD-10-CM | POA: Diagnosis not present

## 2022-06-17 DIAGNOSIS — Z5181 Encounter for therapeutic drug level monitoring: Secondary | ICD-10-CM

## 2022-06-17 DIAGNOSIS — Z993 Dependence on wheelchair: Secondary | ICD-10-CM

## 2022-06-17 MED ORDER — LIDOCAINE 5 % EX OINT: 1.0000 | TOPICAL_OINTMENT | Freq: Four times a day (QID) | CUTANEOUS | 5 refills | Status: DC | PRN

## 2022-06-17 MED ORDER — ONDANSETRON HCL 4 MG PO TABS: 4.0000 mg | ORAL_TABLET | Freq: Three times a day (TID) | ORAL | 1 refills | Status: DC | PRN

## 2022-06-17 MED ORDER — TRAMADOL HCL 50 MG PO TABS: 50.0000 mg | ORAL_TABLET | Freq: Three times a day (TID) | ORAL | 0 refills | Status: DC | PRN

## 2022-06-17 NOTE — Progress Notes (Signed)
Subjective:    Patient ID: Mary [name], female    DOB: [birth year], 82 y.o.   MRN: [id number]  HPI  Pt is an 82 yr old female with seronegative RA who is nonambulatory with R foot pain; also has OA, gout and osteoporosis; Also has UIP fibrosis; also has HTN and DM- cannot check in computer- A1c not available- per pt, down to 6.   Sent here by Dr Nigel Sloop   Here for evaluation of R foot/chronic pain.  Here with Mary [name]- her daughter.   R foot pain is her main issue.  Wearing post op shoe  Has pain shoot into toes- can burn in ankle as well- feels like someone has shot her.   Big toe- also hurts- Dr Nigel Sloop didn't think was gout-  Crowding of toes- and pushed all together-  Also has limited ROM of shoulders and elbows and  Almost has fist of fingers on R hand and slightly less on L hand   Would feel good it foot didn't hurt  Kidney function OK based on last labs- Cr -.67 and BUN 24-   Allergy- ASA-    Uses a w/c to get around-  Gets in/out of bed in hoyer lift Has a lift bed- works like hospital bed- more comfortable- no rails.  W/c van to transport- has ramp to get in Doesn't have reacher- has husband-  Has lift chair to sit in during day instead of bed.  Only gets in w/c to go places Has a w/c cushion- doesn't fit w/c really well.  No skin breakdown on bottom- ever    Tried: tylenol  arthritis meds- Arava and Plaquenil  Also on Allopurinol for gout Never tried pain meds specifically  Pain Inventory Average Pain 9 Pain Right Now 2 My pain is constant, burning, stabbing, and aching  In the last 24 hours, has pain interfered with the following? General activity 2 Relation with others 9 Enjoyment of life 9 What TIME of day is your pain at its worst? evening Sleep (in general) NA  Pain is worse with: unsure and some activites Pain improves with: rest and medication Relief from Meds: 3  use a wheelchair needs help with transfers  disabled: date  disabled . I need assistance with the following:  feeding, dressing, bathing, toileting, meal prep, household duties, and shopping  bowel control problems depression anxiety  Any changes since last visit?  no  Any changes since last visit?  no    Family History  Problem Relation Age of Onset   Arthritis Brother    Arthritis Sister    Arthritis Sister    Arthritis Brother    Arthritis Brother    Arthritis Brother    Social History   Socioeconomic History   Marital status: Married    Spouse name: Not on file   Number of children: Not on file   Years of education: Not on file   Highest education level: Not on file  Occupational History   Not on file  Tobacco Use   Smoking status: Never    Passive exposure: Past   Smokeless tobacco: Never  Vaping Use   Vaping Use: Never used  Substance and Sexual Activity   Alcohol use: No   Drug use: Never   Sexual activity: Not on file  Other Topics Concern   Not on file  Social History Narrative   Not on file   Social Determinants of Health   Financial Resource Strain: Not on file  Food Insecurity: Not on file  Transportation Needs: Not on file  Physical Activity: Not on file  Stress: Not on file  Social Connections: Not on file   Past Surgical History:  Procedure Laterality Date   ABDOMINAL HYSTERECTOMY     Past Medical History:  Diagnosis Date   Complication of anesthesia    Diabetes mellitus without complication    Dyspnea    Gout    High cholesterol    History of kidney stones    Hypertension    Hypoxia 08/2016   Osteoarthritis    PONV (postoperative nausea and vomiting)    Rheumatoid arthritis    BP 136/82   Pulse 86   Ht 5' (1.524 m)   Wt 150 lb (68 kg) Comment: reported by pt  SpO2 (!) 88% Comment: not on O2 but seels pulmonologist  BMI 29.29 kg/m   Opioid Risk Score:   Fall Risk Score:  `1  Depression screen [place] 2/9     06/17/2022   11:33 AM  Depression screen PHQ 2/9  Decreased Interest  0  Down, Depressed, Hopeless 0  PHQ - 2 Score 0  Altered sleeping 0  Tired, decreased energy 0  Change in appetite 0  Feeling bad or failure about yourself  0  Trouble concentrating 0  Moving slowly or fidgety/restless 0  Suicidal thoughts 0  PHQ-9 Score 0     Review of Systems  Constitutional: Negative.   HENT: Negative.    Eyes: Negative.   Respiratory: Negative.    Cardiovascular: Negative.   Gastrointestinal:        Bowel control  Endocrine: Negative.   Genitourinary: Negative.   Musculoskeletal:        Foot pain right  Skin: Negative.   Allergic/Immunologic: Negative.   Neurological:  Positive for numbness.  Hematological: Negative.   Psychiatric/Behavioral:  Positive for dysphoric mood. The patient is nervous/anxious.   All other systems reviewed and are negative.      Objective:   Physical Exam  Awake, alert, appropriate, in post op shoe- with velcro- accompanied by daughter- in Mary w/c- cushion  doesn't fit w/c- - too small-  Got w/c at flea market  Crowding of toes- and pushed all together-  Also has limited ROM of shoulders and elbows and  Almost has fist of fingers on R hand and slightly less on L hand  Callus on 3rd base of MTP head on foot- pretty flat- but hard to touch Has very large bunion on medial aspect of R/medial 1st MTP base- toes crowded to lateral aspect- aren't actually in a line.   Hyperextension of R CMC joint- and flexion of PIP Rest of fingers crowded together toward ulnar aspect- and almost a fist- hyperflexion of PIPs Slightly less hyperflexion of L hand, but basically the same  Lacking 20 degrees elbow extension B/L  Can get shoulders/arms above head, but limited ROM and causes pain  Lacking 65 degrees extension of R knee and 55 degrees on L knee  Also leans to right in current w/c from flea market-     Assessment & Plan:     Pt is an 82 yr old female with seronegative RA who is nonambulatory with R foot pain; also has  OA, gout and osteoporosis; Also has UIP fibrosis; also has HTN and DM- cannot check in computer- A1c not available- per pt, down to 6.   Sent here by Dr Nigel Sloop   Here for evaluation of R foot/chronic pain.  Topical ointment- lidocaine 5% can apply with covering of saran wrap on R foot to keep it in place- up to 4x/day   2.  Opiate contract and oral drug screen- went over need for them- to not get pain meds from other doctors unless gets from ER and then let me know in 48-72 hours.    3.  Tramadol-  50 mg up to 3x/day AS NEEDED- -  take as extra- if you take WITH tylenol- it's synergistic- they work better together.   4. Tramadol can cause nausea, however usually improved within 1 week- getting used to the medicine.  Sedation and nausea- are 2 most common side effects- but constipation can also be caused by tramadol    5.  Zofran as needed- for any nausea form Tramadol- hopefully should help until gets better  6. Might need to  add an mild laxative over the counter- can also magnesium - already takes 500 mg daily- can take up to 1000 mg/day-    7. Call me in 1 week to let me know how helping- if it is, and drug screen OK, then can refill for her for future- but let me know how things going.    8. F/U in 3 months-    9. Will get pt started on getting her a new w/c- needs power w/c- with tilt in space, specific w/c pressure relieving cushion and leg elevation and elevation to get in/out of her bed with hoyer lift easier- also leans to Right, so will need to be able to keep upright.  due to forming contractures of LE's- due to her hands, she cannot push a Mary w/c- would need power w/c so she can move chair at all. Has w/c [employer] Niece, so should be able to transport power w/c- and allow her to get out of her house more, as well.  Called [place] and Kathee Delton by [employer]- for new pt evaluation- will ask for loaner w/c if possible.  Never had pressure ulcer- has intact sensation.       I spent a total of  47   minutes on total care today- >50% coordination of care- due to calling [employer] company, discussing how this w/c could help her move more- pain control, oral drug screen and and opiate contract.

## 2022-06-17 NOTE — Addendum Note (Signed)
Addended by: Silas [place] T on: 06/17/2022 12:33 PM   Modules accepted: Orders

## 2022-06-17 NOTE — Telephone Encounter (Signed)
PA FOR TRAMADOL SUBMITTED TO COVER MY MED.

## 2022-06-17 NOTE — Patient Instructions (Signed)
Pt is an 81 yr old female with seronegative RA who is nonambulatory with R foot pain; also has OA, gout and osteoporosis; Also has UIP fibrosis; also has HTN and DM- cannot check in computer- A1c not available- per pt, down to 6.   Sent here by Dr Nigel Sloop   Here for evaluation of R foot/chronic pain.   Topical ointment- lidocaine 5% can apply with covering of saran wrap on R foot to keep it in place- up to 4x/day   2.  Opiate contract and oral drug screen- went over need for them- to not get pain meds from other doctors unless gets from ER and then let me know in 48-72 hours.    3.  Tramadol-  50 mg up to 3x/day AS NEEDED- -  take as extra- if you take WITH tylenol- it's synergistic- they work better together.   4. Tramadol can cause nausea, however usually improved within 1 week- getting used to the medicine.  Sedation and nausea- are 2 most common side effects- but constipation can also be caused by tramadol    5.  Zofran as needed- for any nausea form Tramadol- hopefully should help until gets better  6. Might need to  add an mild laxative over the counter- can also magnesium - already takes 500 mg daily- can take up to 1000 mg/day-    7. Call me in 1 week to let me know how helping- if it is, and drug screen OK, then can refill for her for future- but let me know how things going.    8. F/U in 3 months-    9. Will get pt started on getting her a new w/c- needs power w/c- with tilt in space, specific w/c pressure relieving cushion and leg elevation and elevation to get in/out of her bed with hoyer lift easier- also leans to Right, so will need to be able to keep upright.  due to forming contractures of LE's- due to her hands, she cannot push a manual w/c- would need power w/c so she can move chair at all. Has w/c [employer] Niece, so should be able to transport power w/c- and allow her to get out of her house more, as well.  Called Harrie Jeans by [employer]- for new pt evaluation- will ask for  loaner w/c if possible.  Never had pressure ulcer- has intact sensation.

## 2022-06-18 NOTE — Telephone Encounter (Signed)
Rx for Tramadol 50 MG approved  until 03/02/2023.

## 2022-06-20 LAB — DRUG TOX MONITOR 1 W/CONF, ORAL FLD
Alprazolam: NEGATIVE ng/mL (ref <0.50)
Amphetamines: NEGATIVE ng/mL (ref <10)
Barbiturates: NEGATIVE ng/mL (ref <10)
Benzodiazepines: POSITIVE ng/mL — AB (ref <0.50)
Buprenorphine: NEGATIVE ng/mL (ref <0.10)
Chlordiazepoxide: NEGATIVE ng/mL (ref <0.50)
Clonazepam: NEGATIVE ng/mL (ref <0.50)
Cocaine: NEGATIVE ng/mL (ref <5.0)
Diazepam: NEGATIVE ng/mL (ref <0.50)
Fentanyl: NEGATIVE ng/mL (ref <0.10)
Flunitrazepam: NEGATIVE ng/mL (ref <0.50)
Flurazepam: NEGATIVE ng/mL (ref <0.50)
Heroin Metabolite: NEGATIVE ng/mL (ref <1.0)
Lorazepam: NEGATIVE ng/mL (ref <0.50)
MARIJUANA: NEGATIVE ng/mL (ref <2.5)
MDMA: NEGATIVE ng/mL (ref <10)
Meprobamate: NEGATIVE ng/mL (ref <2.5)
Methadone: NEGATIVE ng/mL (ref <5.0)
Midazolam: NEGATIVE ng/mL (ref <0.50)
Nicotine Metabolite: NEGATIVE ng/mL (ref <5.0)
Nordiazepam: 10.38 ng/mL — ABNORMAL HIGH (ref <0.50)
Opiates: NEGATIVE ng/mL (ref <2.5)
Oxazepam: 1.39 ng/mL — ABNORMAL HIGH (ref <0.50)
Phencyclidine: NEGATIVE ng/mL (ref <10)
Tapentadol: NEGATIVE ng/mL (ref <5.0)
Temazepam: NEGATIVE ng/mL (ref <0.50)
Tramadol: NEGATIVE ng/mL (ref <5.0)
Triazolam: NEGATIVE ng/mL (ref <0.50)
Zolpidem: NEGATIVE ng/mL (ref <5.0)

## 2022-06-20 LAB — DRUG TOX ALC METAB W/CON, ORAL FLD: Alcohol Metabolite: NEGATIVE ng/mL (ref <25)

## 2022-06-22 ENCOUNTER — Other Ambulatory Visit: Payer: Self-pay | Admitting: Physician Assistant

## 2022-06-23 ENCOUNTER — Telehealth: Payer: Self-pay | Admitting: Physical Medicine and Rehabilitation

## 2022-06-23 MED ORDER — TRAMADOL HCL 50 MG PO TABS: 50.0000 mg | ORAL_TABLET | Freq: Three times a day (TID) | ORAL | 2 refills | Status: DC | PRN

## 2022-06-23 NOTE — Telephone Encounter (Signed)
UDS looks good-  Sent in tramadol 50 mg TID- #90 -with 2 refills.   Let's see how this works for her.- Thanks- ML

## 2022-06-24 ENCOUNTER — Ambulatory Visit: Payer: Medicare HMO | Attending: Physician Assistant | Admitting: Physician Assistant

## 2022-06-24 ENCOUNTER — Encounter: Payer: Self-pay | Admitting: Physician Assistant

## 2022-06-24 VITALS — BP 103/58 | HR 82 | Resp 14 | Ht 64.0 in

## 2022-06-24 DIAGNOSIS — Z8639 Personal history of other endocrine, nutritional and metabolic disease: Secondary | ICD-10-CM

## 2022-06-24 DIAGNOSIS — Z8619 Personal history of other infectious and parasitic diseases: Secondary | ICD-10-CM

## 2022-06-24 DIAGNOSIS — M810 Age-related osteoporosis without current pathological fracture: Secondary | ICD-10-CM

## 2022-06-24 DIAGNOSIS — J849 Interstitial pulmonary disease, unspecified: Secondary | ICD-10-CM | POA: Diagnosis not present

## 2022-06-24 DIAGNOSIS — M2450 Contracture, unspecified joint: Secondary | ICD-10-CM

## 2022-06-24 DIAGNOSIS — M0609 Rheumatoid arthritis without rheumatoid factor, multiple sites: Secondary | ICD-10-CM | POA: Diagnosis not present

## 2022-06-24 DIAGNOSIS — M1A09X0 Idiopathic chronic gout, multiple sites, without tophus (tophi): Secondary | ICD-10-CM

## 2022-06-24 DIAGNOSIS — I10 Essential (primary) hypertension: Secondary | ICD-10-CM

## 2022-06-24 DIAGNOSIS — M79671 Pain in right foot: Secondary | ICD-10-CM

## 2022-06-24 DIAGNOSIS — Z79899 Other long term (current) drug therapy: Secondary | ICD-10-CM | POA: Diagnosis not present

## 2022-06-24 DIAGNOSIS — Z96652 Presence of left artificial knee joint: Secondary | ICD-10-CM

## 2022-06-24 DIAGNOSIS — Z87442 Personal history of urinary calculi: Secondary | ICD-10-CM

## 2022-06-24 DIAGNOSIS — M1711 Unilateral primary osteoarthritis, right knee: Secondary | ICD-10-CM

## 2022-06-24 LAB — CBC WITH DIFFERENTIAL/PLATELET
Absolute Monocytes: 441 cells/uL (ref 200–950)
MCHC: 31.5 g/dL — ABNORMAL LOW (ref 32.0–36.0)
MPV: 13.7 fL — ABNORMAL HIGH (ref 7.5–12.5)
Platelets: 229 10*3/uL (ref 140–400)
RBC: 3.54 10*6/uL — ABNORMAL LOW (ref 3.80–5.10)
RDW: 15.2 % — ABNORMAL HIGH (ref 11.0–15.0)
Total Lymphocyte: 17.7 %

## 2022-06-25 LAB — CBC WITH DIFFERENTIAL/PLATELET
Basophils Absolute: 98 cells/uL (ref 0–200)
Basophils Relative: 1.4 %
Eosinophils Absolute: 651 cells/uL — ABNORMAL HIGH (ref 15–500)
Eosinophils Relative: 9.3 %
HCT: 32.4 % — ABNORMAL LOW (ref 35.0–45.0)
Hemoglobin: 10.2 g/dL — ABNORMAL LOW (ref 11.7–15.5)
Lymphs Abs: 1239 cells/uL (ref 850–3900)
MCH: 28.8 pg (ref 27.0–33.0)
MCV: 91.5 fL (ref 80.0–100.0)
Monocytes Relative: 6.3 %
Neutro Abs: 4571 cells/uL (ref 1500–7800)
Neutrophils Relative %: 65.3 %
WBC: 7 10*3/uL (ref 3.8–10.8)

## 2022-06-25 LAB — COMPLETE METABOLIC PANEL WITH GFR
AG Ratio: 1.6 (calc) (ref 1.0–2.5)
ALT: 8 U/L (ref 6–29)
AST: 11 U/L (ref 10–35)
Albumin: 4.2 g/dL (ref 3.6–5.1)
Alkaline phosphatase (APISO): 79 U/L (ref 37–153)
BUN/Creatinine Ratio: 30 (calc) — ABNORMAL HIGH (ref 6–22)
BUN: 37 mg/dL — ABNORMAL HIGH (ref 7–25)
CO2: 20 mmol/L (ref 20–32)
Calcium: 9.5 mg/dL (ref 8.6–10.4)
Chloride: 106 mmol/L (ref 98–110)
Creat: 1.22 mg/dL — ABNORMAL HIGH (ref 0.60–0.95)
Globulin: 2.7 g/dL (calc) (ref 1.9–3.7)
Glucose, Bld: 210 mg/dL — ABNORMAL HIGH (ref 65–99)
Potassium: 4.8 mmol/L (ref 3.5–5.3)
Sodium: 138 mmol/L (ref 135–146)
Total Bilirubin: 0.2 mg/dL (ref 0.2–1.2)
Total Protein: 6.9 g/dL (ref 6.1–8.1)
eGFR: 45 mL/min/{1.73_m2} — ABNORMAL LOW (ref >=60)

## 2022-06-25 NOTE — Progress Notes (Signed)
Anemia has improving.  Glucose is 210.  Creatinine is elevated-1.22 and GFR is low 45.  Please clarify if she has been taking any NSAIDs?   Rest of CMP WNL.

## 2022-06-26 ENCOUNTER — Ambulatory Visit: Payer: Medicare HMO | Admitting: Pulmonary Disease

## 2022-06-26 ENCOUNTER — Encounter: Payer: Self-pay | Admitting: Pulmonary Disease

## 2022-06-26 VITALS — BP 122/70 | HR 77 | Temp 97.9°F | Ht 63.0 in | Wt 150.0 lb

## 2022-06-26 DIAGNOSIS — J849 Interstitial pulmonary disease, unspecified: Secondary | ICD-10-CM

## 2022-06-26 DIAGNOSIS — J4530 Mild persistent asthma, uncomplicated: Secondary | ICD-10-CM

## 2022-06-26 NOTE — Patient Instructions (Signed)
Your CT shows very minimal progression which is good news. Continue to use the nebulizer I will see you in 1 year with a video visit.

## 2022-06-26 NOTE — Progress Notes (Signed)
Mary [name]    [id number]    08-05-1940  Primary Care Physician:Hodge, Orie Rout, NP  Referring Physician: Erskine Emery, NP [address] [place],  [place] [address]  Chief complaint:   Follow up for RA-ILD  HPI: 82 y.o.  with history of rheumatoid arthritis, diabetes, gout, hypertension, hyperlipidemia and hypothyroidism. She is being followed by Dr. Corliss Skains for management of rheumatoid arthritis. She had been previously maintained on methotrexate. She was taken off methotrexate with plans to transition to leflunomide. Before initiation of leflunomide she was hospitalized July 2018 with respiratory failure, bilateral lung infiltrates. CT at that time did not show any pulmonary embolus but there are diffuse ground glass opacities concerning for interstitial lung disease or edema.   She was started on prednisone and diuresed with improvement in symptoms. Bronchoscopy was considered but deferred due to improvement in respiratory status. She has been discharged on a prednisone taper and has followed up with Dr. Corliss Skains and is currently on [place].  Interim History:. Continues on Arava and Plaquenil per Dr. Corliss Skains States that breathing is stable.  Has occasional chest congestion and wheezing Currently on DuoNebs.  She cannot use an inhaler due to problems with using the inhaler with arthritis of the hand.  Here for review of CT scan  Outpatient Encounter Medications as of 06/26/2022  Medication Sig   acetaminophen (TYLENOL) 500 MG tablet as needed.    albuterol (PROVENTIL) (2.5 MG/3ML) 0.083% nebulizer solution Take 3 mLs (2.5 mg total) by nebulization every 6 (six) hours as needed for wheezing or shortness of breath.   allopurinol (ZYLOPRIM) 100 MG tablet Take 200 mg by mouth daily.    amLODipine (NORVASC) 2.5 MG tablet Take 2.5 mg by mouth daily.   atorvastatin (LIPITOR) 20 MG tablet SMARTSIG:1 Tablet(s) By Mouth Every Evening   benazepril (LOTENSIN) 20 MG tablet Take 1  tablet (20 mg total) by mouth daily.   carvedilol (COREG) 6.25 MG tablet    Cholecalciferol (VITAMIN D3) 5000 units TABS Take 1 tablet by mouth. Takes on Monday, Wednesday, and Friday.   clorazepate (TRANXENE) 7.5 MG tablet Take 7.5 mg by mouth 2 (two) times daily.    hydroxychloroquine (PLAQUENIL) 200 MG tablet Take 1 tablet by mouth twice daily, take on Monday-Fridays only.   ipratropium-albuterol (DUONEB) 0.5-2.5 (3) MG/3ML SOLN Take 3 mLs by nebulization 4 (four) times daily as needed.   leflunomide (ARAVA) 20 MG tablet TAKE ONE (1) TABLET BY MOUTH EVERY DAY   levothyroxine (SYNTHROID, LEVOTHROID) 75 MCG tablet Take 75 mcg by mouth daily.    lidocaine (XYLOCAINE) 5 % ointment Apply 1 Application topically 4 (four) times daily as needed for mild pain. Can put on a layer like icing a cake and then saran wrap it to stay on-   magnesium oxide (MAG-OX) 400 MG tablet Take 1 tablet by mouth daily.   metFORMIN (GLUCOPHAGE-XR) 500 MG 24 hr tablet Take 500 mg by mouth daily.   ondansetron (ZOFRAN) 4 MG tablet Take 1 tablet (4 mg total) by mouth every 8 (eight) hours as needed for nausea or vomiting.   traMADol (ULTRAM) 50 MG tablet Take 1 tablet (50 mg total) by mouth 3 (three) times daily as needed for severe pain. Take when tylenol not enough   benzonatate (TESSALON) 200 MG capsule Take 200 mg by mouth 3 (three) times daily as needed for cough. (Patient not taking: Reported on 06/24/2022)   No facility-administered encounter medications on file as  of 06/26/2022.   Physical Exam: Blood pressure 122/70, pulse 77, temperature 97.9 F (36.6 C), temperature source Oral, height 5\' 3"  (1.6 m), weight 150 lb (68 kg), SpO2 92 %. Gen:      No acute distress HEENT:  EOMI, sclera anicteric Neck:     No masses; no thyromegaly Lungs:    Clear to auscultation bilaterally; normal respiratory effort CV:         Regular rate and rhythm; no murmurs Abd:      + bowel sounds; soft, non-tender; no palpable masses, no  distension Ext:    No edema; adequate peripheral perfusion Skin:      Warm and dry; no rash Neuro: alert and oriented x 3 Psych: normal mood and affect   Data Reviewed: Imaging CTA chest 09/03/16- Negative for pulmonary embolus. Extensive bilateral ground-glass opacities, bilateral hilar and mediastinal lymph nodes. Calcific aortic and coronary atherosclerosis.  Hiatal hernia.  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 High-resolution CT 11/25/2020-stable UIP pattern pulmonary fibrosis High resolution CT 05/20/2022-UIP fibrosis with minimal progression I have reviewed the images personally.  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 Continues on Arava and Plaquenil per rheumatology.  CT reviewed with UIP fibrosis.  Clinically she appears stable and on comparison with CT in 2018 there appears to be honeycombing even then.   We had discussed antifibrotics in the past but she was not interested in therapy  Follow-up in 1 year with video visit as transportation for her is difficult.  Can order high-resolution CT every 2 years.  Mild persistent asthma As the prednisone was tapered off in 2018 she has developed some wheezing.  Unable to obtain PFTs due to inability to follow instructions.  Has mild elevation in FENO and peripheral eosinophilia.  She was temporarily on Brovana and Pulmicort.  Now off them as her breathing has continued to improve Continue duo nebs as needed.  Health maintenance Give flu vaccine today 09/04/2015- Pneumovax.  Plan/Recommendations: - Follow-up in 1 year - Continue duo nebs as needed  Chilton Greathouse MD [facility] 06/26/2022, 11:26 AM  CC: Erskine Emery, NP

## 2022-06-30 ENCOUNTER — Encounter: Payer: Self-pay | Admitting: Physical Medicine and Rehabilitation

## 2022-07-07 ENCOUNTER — Encounter: Payer: Self-pay | Admitting: *Deleted

## 2022-07-07 ENCOUNTER — Other Ambulatory Visit: Payer: Self-pay | Admitting: *Deleted

## 2022-07-07 MED ORDER — HYDROXYCHLOROQUINE SULFATE 200 MG PO TABS: ORAL_TABLET | ORAL | 0 refills | Status: DC

## 2022-07-07 NOTE — Telephone Encounter (Signed)
Based on her GFR please reduce the dose of hydroxychloroquine to 1 tablet p.o. daily Monday to Friday.  Please notify patient that we have reduced the dose based on her GFR.

## 2022-07-07 NOTE — Telephone Encounter (Signed)
Refill request received via fax from [facility] for PLQ   Last Fill: 04/07/2022  Eye exam: 04/23/2021 WNL    Labs: 06/24/2022 Anemia has improving.  Glucose is 210.  Creatinine is elevated-1.22 and GFR is low 45. Rest of CMP WNL.   Next Visit: 09/24/2022  Last Visit: 06/24/2022  DX: Rheumatoid arthritis of multiple sites with negative rheumatoid factor   Current Dose per office note 06/24/2022: plaquenil 200 mg 1 tablet by mouth twice daily M-F.   Message sent to patient via my chart to advise patient she is due to update her PLQ eye exam.   Okay to refill Plaquenil?

## 2022-07-07 NOTE — Telephone Encounter (Signed)
Spoke with patient's daughter [name] who is on dpr and advised Based on her GFR we will reduce the dose of hydroxychloroquine to 1 tablet p.o. daily Monday to Friday.

## 2022-07-24 ENCOUNTER — Other Ambulatory Visit: Payer: Self-pay

## 2022-07-24 MED ORDER — ONDANSETRON HCL 4 MG PO TABS: 4.0000 mg | ORAL_TABLET | Freq: Three times a day (TID) | ORAL | 1 refills | Status: DC | PRN

## 2022-08-19 ENCOUNTER — Other Ambulatory Visit: Payer: Self-pay | Admitting: Physician Assistant

## 2022-08-19 ENCOUNTER — Other Ambulatory Visit: Payer: Self-pay | Admitting: *Deleted

## 2022-08-19 MED ORDER — LEFLUNOMIDE 20 MG PO TABS: ORAL_TABLET | ORAL | 0 refills | Status: DC

## 2022-08-19 NOTE — Telephone Encounter (Signed)
Patient contacted the office asking for a refill of Arava to be sent to [facility].  Last Fill: 04/20/2022   Labs: 06/24/2022 Anemia has improving.  Glucose is 210.  Creatinine is elevated-1.22 and GFR is low 45. Rest of CMP WNL.   Next Visit: 09/24/2022  Last Visit: 06/24/2022  DX: Rheumatoid arthritis of multiple sites with negative rheumatoid factor   Current Dose per office note 06/24/2022: Arava 20 mg 1 tablet by mouth daily   Okay to refill Arava ?

## 2022-08-31 ENCOUNTER — Encounter: Payer: Self-pay | Admitting: Rheumatology

## 2022-09-10 NOTE — Progress Notes (Unsigned)
Office Visit Note  Patient: Mary [name]             Date of Birth: 05/24/1940           MRN: [id number]             PCP: Erskine Emery, NP Referring: Erskine Emery, NP Visit Date: 09/24/2022 Occupation: @GUAROCC @  Subjective:  Right hip pain  History of Present Illness: Mary [name] is a 82 y.o. female with history of seronegative rheumatoid arthritis, osteoarthritis, and gout.  Patient remains on Arava 20 mg 1 tablet by mouth daily and plaquenil 200 mg 1 tablet by mouth daily M-F.  She is tolerating combination therapy without any side effects.  She denies missing any doses recently.  She is currently taking a course of antibiotics for management of a UTI.  She has not been holding Arava while taking the antibiotic.  She denies any recurrent infections.  Patient states she has been having increased discomfort in her right hip joint.  She has been taking Tylenol at bedtime for pain relief.  Patient states that she tried taking a half a tablet of tramadol but did not like how drowsy it made her feel and has not been taking it consistently.  She continues to have chronic pain and stiffness but denies any increased inflammation recently.  Activities of Daily Living:  Patient reports morning stiffness for 10 minutes.   Patient Reports nocturnal pain.  Difficulty dressing/grooming: Reports Difficulty climbing stairs: Reports Difficulty getting out of chair: Reports Difficulty using hands for taps, buttons, cutlery, and/or writing: Reports  Review of Systems  Constitutional:  Negative for fatigue.  HENT:  Negative for mouth sores and mouth dryness.   Eyes:  Negative for dryness.  Respiratory:  Negative for shortness of breath.   Cardiovascular:  Negative for chest pain and palpitations.  Gastrointestinal:  Positive for constipation. Negative for blood in stool and diarrhea.  Endocrine: Positive for increased urination.  Genitourinary:  Negative for involuntary urination.   Musculoskeletal:  Positive for joint pain, gait problem, joint pain, joint swelling, myalgias, morning stiffness, muscle tenderness and myalgias. Negative for muscle weakness.  Skin:  Negative for color change, rash, hair loss and sensitivity to sunlight.  Allergic/Immunologic: Negative for susceptible to infections.  Neurological:  Negative for dizziness and headaches.  Hematological:  Negative for swollen glands.  Psychiatric/Behavioral:  Negative for depressed mood and sleep disturbance. The patient is not nervous/anxious.     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 factor (HCC) 02/28/2016   High risk medication use 02/28/2016   Idiopathic chronic gout of multiple sites without tophus 02/28/2016   Primary osteoarthritis of both knees 02/28/2016   Contracture, joint, multiple sites 02/28/2016   History of hypothyroidism 02/28/2016   History of diabetes mellitus 02/28/2016   History of shingles 02/28/2016   History of hyperlipidemia 02/28/2016    Past Medical History:  Diagnosis Date   Complication of anesthesia    Diabetes mellitus without complication (HCC)    Dyspnea    Gout    High cholesterol    History of kidney stones  Hypertension    Hypoxia 08/2016   Osteoarthritis    PONV (postoperative nausea and vomiting)    Rheumatoid arthritis (HCC)     Family History  Problem Relation Age of Onset   Arthritis Brother    Arthritis Sister    Arthritis Sister    Arthritis Brother    Arthritis Brother    Arthritis Brother    Past Surgical History:  Procedure  Laterality Date   ABDOMINAL HYSTERECTOMY     Social History   Social History Narrative   Not on file   Immunization History  Administered Date(s) Administered   Fluad Quad(high Dose 65+) 12/01/2018, 11/19/2021   Influenza, High Dose Seasonal PF 11/26/2015   Influenza-Unspecified 12/07/2016   PFIZER(Purple Top)SARS-COV-2 Vaccination 04/07/2019, 04/28/2019   Pneumococcal-Unspecified 09/04/2015     Objective: Vital Signs: BP 118/73 (BP Location: Left Arm, Patient Position: Sitting, Cuff Size: Normal)   Pulse 84   Resp 17   Ht 5' (1.524 m)   BMI 29.29 kg/m    Physical Exam Vitals and nursing note reviewed.  Constitutional:      Appearance: She is well-developed.  HENT:     Head: Normocephalic and atraumatic.  Eyes:     Conjunctiva/sclera: Conjunctivae normal.  Cardiovascular:     Rate and Rhythm: Normal rate and regular rhythm.     Heart sounds: Normal heart sounds.  Pulmonary:     Effort: Pulmonary effort is normal.     Breath sounds: Normal breath sounds.  Abdominal:     General: Bowel sounds are normal.     Palpations: Abdomen is soft.  Musculoskeletal:     Cervical back: Normal range of motion.  Lymphadenopathy:     Cervical: No cervical adenopathy.  Skin:    General: Skin is warm and dry.     Capillary Refill: Capillary refill takes less than 2 seconds.  Neurological:     Mental Status: She is alert and oriented to person, place, and time.  Psychiatric:        Behavior: Behavior normal.      Musculoskeletal Exam: Patient remained seated during the examination today.  C-spine has limited ROM.  Thoracic kyphosis.  Painful and limited ROM of both shoulders.  Bilateral elbow flexion contractures noted.  Severely limited range of motion of both wrist joints.  Synovial thickening of the right wrist.  Ulnar deviation noted.  Incomplete fist formation noted.  Contractures of MCP and PIP joints, right hand more severe than left.  Painful ROM of the right hip.  Limited  ROM of both hip joints.  Right knee has limited extension due to a flexion contracture.  Left knee replacement has limited extension.  Subluxation of both ankle joints but no tenderness upon palpation today.  CDAI Exam: CDAI Score: 7  Patient Global: 30 / 100; Provider Global: 30 / 100 Swollen: 0 ; Tender: 1  Joint Exam 09/24/2022      Right  Left  Wrist   Tender        Investigation: No additional findings.  Imaging: No results found.  Recent Labs: Lab Results  Component Value Date   WBC 7.0 06/24/2022   HGB 10.2 (L) 06/24/2022   PLT 229 06/24/2022   NA 138 06/24/2022   K 4.8 06/24/2022   CL 106 06/24/2022   CO2 20 06/24/2022   GLUCOSE 210 (H) 06/24/2022   BUN 37 (H) 06/24/2022   CREATININE 1.22 (H) 06/24/2022   BILITOT 0.2 06/24/2022   ALKPHOS 87 04/10/2022  AST 11 06/24/2022   ALT 8 06/24/2022   PROT 6.9 06/24/2022   ALBUMIN 4.0 04/10/2022   CALCIUM 9.5 06/24/2022   GFRAA 95 03/26/2020    Speciality Comments: Prior therapy: Plaquenil (d/c per opthamologist recommendation) and SSZ (diarrhea) PLQ Eye Exam: 08/27/2022 WNL [facility] Follow up 6 months  Please scheudle patient's appts. every 3 month per patient request for lab compliance.    Procedures:  No procedures performed Allergies: Aspirin and Codeine   Assessment / Plan:     Visit Diagnoses: Rheumatoid arthritis of multiple sites with negative rheumatoid factor (HCC) - Severe end-stage rheumatoid arthritis with multiple contractures.  She is wheelchair-bound: Patient remains on Arava 20 mg 1 tablet by mouth daily and Plaquenil 200 mg 1 tablet by mouth daily Monday through Friday.  She is tolerating combination therapy without any side effects.  Overall her rheumatoid arthritis remains stable on combination therapy.  She has had increased discomfort in her right hip with no recent injury or fall.  She is primarily wheelchair-bound and knows that she is not a good candidate for a hip replacement.   She would like to try avoiding having to see an orthopedist and requested to have a right hip x-ray today. She will remain on Arava and Plaquenil as prescribed.  A refill of Plaquenil and arava were sent to the pharmacy today.  She was advised to notify us if she develop signs or symptoms of a flare.  She will follow-up in the office in 3 months or sooner if needed.  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 Isaiah Serge on November 19, 2021.  Unable to obtain PFTs in the past.  High risk medication use - Arava 20 mg 1 tablet by mouth daily and plaquenil 200 mg 1 tablet by mouth daily M-F.  CBC and CMP updated on 06/24/22. Orders for CBC and CMP released today.  Discussed the importance of holding arava if she develops signs or symptoms of an infection and to resume once the infection has completely cleared.  PLQ Eye Exam: 08/27/2022 WNL [facility]  - Plan: CBC with Differential/Platelet, COMPLETE METABOLIC PANEL WITH GFR  Idiopathic chronic gout of multiple sites without tophus - She has not had any signs or symptoms of a gout flare.  She is taking allopurinol 200 mg daily for management of gout. uric acid: 4.9 on 04/10/2022. Uric acid will be updated today.   Primary osteoarthritis of right knee: Flexion contracture.  Primarily wheelchair bound.   S/P TKR (total knee replacement), left: Limited extension.  Primarily wheelchair bound.   Age-related osteoporosis without current pathological fracture - DEXA on 06/18/2020 Right femoral neck BMD 0.593 with T score -2.3.  Patient has declined to schedule updated bone density at this time.  Flexion contractures - - End stage rheumatoid arthritis. Flexion contracture ~10 degrees in both elbows.  Flexion contracture of both knees.  Patient is primarily wheelchair-bound.  Pain in right hip - Patient presents today with increased pain in the right hip.  According to the patient a referral to orthopedics  has been placed but she requested to have an x-ray of her right hip today.  She is aware that she is not a good candidate for hip replacement but would like to ensure there is no fracture.  She has limited range of motion of the right hip joint on examination today with discomfort in the groin.  Plan: XR HIP UNILAT W  OR W/O PELVIS 2-3 VIEWS RIGHT  Other medical conditions are listed as follows:   History of diabetes mellitus  History of kidney stones  History of hyperlipidemia -Lipid panel will be obtained today and results will be forwarded to her cardiologist.  Plan: Lipid panel  History of shingles  Essential hypertension: Blood pressure was 118/73 today in the office.  History of hypothyroidism   Orders: Orders Placed This Encounter  Procedures   XR HIP UNILAT W OR W/O PELVIS 2-3 VIEWS RIGHT   CBC with Differential/Platelet   COMPLETE METABOLIC PANEL WITH GFR   Lipid panel   Meds ordered this encounter  Medications   hydroxychloroquine (PLAQUENIL) 200 MG tablet    Sig: Take 1 tablet by mouth daily Monday-Fridays only.    Dispense:  60 tablet    Refill:  0    Dose change 07/07/2022   leflunomide (ARAVA) 20 MG tablet    Sig: TAKE ONE (1) TABLET BY MOUTH EVERY DAY    Dispense:  90 tablet    Refill:  0     Follow-Up Instructions: Return in about 3 months (around 12/25/2022) for Rheumatoid arthritis, Osteoarthritis, Gout.   Gearldine Bienenstock, PA-C  Note - This record has been created using Dragon software.  Chart creation errors have been sought, but may not always  have been located. Such creation errors do not reflect on  the standard of medical care.

## 2022-09-16 ENCOUNTER — Encounter: Payer: Medicare HMO | Admitting: Physical Medicine and Rehabilitation

## 2022-09-23 ENCOUNTER — Encounter: Payer: Self-pay | Admitting: Cardiovascular Disease

## 2022-09-23 ENCOUNTER — Ambulatory Visit: Payer: Medicare HMO | Attending: Cardiovascular Disease | Admitting: Cardiovascular Disease

## 2022-09-23 VITALS — BP 118/72 | HR 85 | Ht 60.0 in | Wt 150.0 lb

## 2022-09-23 DIAGNOSIS — I700 Atherosclerosis of aorta: Secondary | ICD-10-CM | POA: Diagnosis not present

## 2022-09-23 DIAGNOSIS — I10 Essential (primary) hypertension: Secondary | ICD-10-CM | POA: Diagnosis not present

## 2022-09-23 DIAGNOSIS — R7303 Prediabetes: Secondary | ICD-10-CM

## 2022-09-23 DIAGNOSIS — M0609 Rheumatoid arthritis without rheumatoid factor, multiple sites: Secondary | ICD-10-CM

## 2022-09-23 DIAGNOSIS — I723 Aneurysm of iliac artery: Secondary | ICD-10-CM

## 2022-09-23 DIAGNOSIS — Z8639 Personal history of other endocrine, nutritional and metabolic disease: Secondary | ICD-10-CM | POA: Diagnosis not present

## 2022-09-23 NOTE — Patient Instructions (Signed)
Medication Instructions:  No changes *If you need a refill on your cardiac medications before your next appointment, please call your pharmacy*   Lab Work: Lipid panel- get it tomorrow at Dr's appt. 09/23/22- does not need to be fasting If you have labs (blood work) drawn today and your tests are completely normal, you will receive your results only by: MyChart Message (if you have MyChart) OR A paper copy in the mail If you have any lab test that is abnormal or we need to change your treatment, we will call you to review the results.  Follow-Up: At [facility], you and your health needs are our priority.  As part of our continuing mission to provide you with exceptional heart care, we have created designated Provider Care Teams.  These Care Teams include your primary Cardiologist (physician) and Advanced Practice Providers (APPs -  Physician Assistants and Nurse Practitioners) who all work together to provide you with the care you need, when you need it.  We recommend signing up for the patient portal called "MyChart".  Sign up information is provided on this After Visit Summary.  MyChart is used to connect with patients for Virtual Visits (Telemedicine).  Patients are able to view lab/test results, encounter notes, upcoming appointments, etc.  Non-urgent messages can be sent to your provider as well.   To learn more about what you can do with MyChart, go to [email or web address].    Your next appointment:    Follow up as needed  Provider:   Dr Royann Shivers

## 2022-09-23 NOTE — Progress Notes (Signed)
Cardiology Office Note:  .   Date:  09/29/2022  ID:  Kampbell Holaway, DOB 1940/07/13, MRN [id number] PCP: Erskine Emery, NP  [facility] Providers Cardiologist:  None    History of Present Illness: Mary [name] is a 82 y.o. 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  She was referred to vascular surgery, but wanted to be seen sooner.  The aneurysm was discovered incidentally during a hospitalization at [facility] for abdominal pain.  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  Ultimately she was treated for urinary tract infection discharge.  She is very sedentary due to rheumatoid arthritis.  Essentially wheelchair-bound.  For her level of activity, she has no complaints of shortness of breath or chest discomfort.  She does not have palpitations, dizziness or syncope or lower extremity edema or claudication.  No focal neurological complaints.  In 2020 a lipid profile showed an LDL cholesterol of 123 and HDL of 49 while on treatment with atorvastatin 10 mg daily.  The dose of atorvastatin was increased to 20 mg daily and she has not had a lipid profile since.   ROS: As described above.  Studies Reviewed: Marland Kitchen   EKG Interpretation Date/Time:  Wednesday September 23 2022 09:47:49 EDT Ventricular Rate:  85 PR Interval:  154 QRS Duration:  76 QT Interval:  348 QTC Calculation: 414 R Axis:   -15  Text Interpretation: Normal sinus rhythm Nonspecific ST and T wave abnormality When compared with ECG of 03-Sep-2016 03:14, No significant change since last tracing Confirmed by Dayelin Balducci [phone]) on 09/23/2022 9:51:19 AM     Limited records from in the hospital. Risk Assessment/Calculations:          Physical Exam:   VS:  BP 118/72   Pulse 85   Ht 5' (1.524 m)   Wt 150 lb (68 kg) Comment: last weighed 3 years ago  SpO2 91%   BMI 29.29 kg/m    Wt Readings from Last 3 Encounters:  09/23/22 150 lb (68 kg)  06/26/22 150 lb (68 kg)  06/17/22 150 lb (68 kg)    GEN: Well nourished, well developed in no acute distress.  Appears elderly and rather frail.  In a wheelchair. NECK: No JVD; No carotid bruits CARDIAC: RRR, no murmurs, rubs, gallops RESPIRATORY:  Clear to auscultation without rales, wheezing or rhonchi  ABDOMEN: Soft, non-tender, non-distended EXTREMITIES:  No edema; prominent rheumatoid arthritis deformities of both hands.  ASSESSMENT AND PLAN: .   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  As long as <3 cm (possibly even <4 cm), rupture risk is low.  Incidental finding, asymptomatic.  Reassured them that this is unlikely to be dangerous anytime in the near future.  Will get the full records from [facility].  There is no mention of her having an aortic aneurysm or large external iliac aneurysm, which would be more paralyzed.  Suspect we will treat this very conservatively.  At most, would recommend a repeat scan in 1 year to make sure that the aneurysm is not rapidly increasing in size.  If it does increase in size quickly, we can then refer to vascular surgery Aortic atherosclerosis: Aorta  was normal in caliber.  No symptoms of CAD. HTN: Well-controlled on the current medications.  No changes recommended. PreDM: On metformin. HLP: Recheck lipid profile.  Target LDL preferably less than 70. RA: Major cause of functional limitations.       Dispo:  Patient Instructions  Medication Instructions:  No changes *If you need a refill on your cardiac medications before your next appointment, please call your pharmacy*   Lab Work: Lipid panel- get it tomorrow at  Dr's appt. 09/23/22- does not need to be fasting If you have labs (blood work) drawn today and your tests are completely normal, you will receive your results only by: MyChart Message (if you have MyChart) OR A paper copy in the mail If you have any lab test that is abnormal or we need to change your treatment, we will call you to review the results.  Follow-Up: At [facility], you and your health needs are our priority.  As part of our continuing mission to provide you with exceptional heart care, we have created designated Provider Care Teams.  These Care Teams include your primary Cardiologist (physician) and Advanced Practice Providers (APPs -  Physician Assistants and Nurse Practitioners) who all work together to provide you with the care you need, when you need it.  We recommend signing up for the patient portal called "MyChart".  Sign up information is provided on this After Visit Summary.  MyChart is used to connect with patients for Virtual Visits (Telemedicine).  Patients are able to view lab/test results, encounter notes, upcoming appointments, etc.  Non-urgent messages can be sent to your provider as well.   To learn more about what you can do with MyChart, go to [email or web address].    Your next appointment:    Follow up as needed  Provider:   Dr Royann Shivers    Signed, Thurmon Fair, MD

## 2022-09-24 ENCOUNTER — Encounter: Payer: Self-pay | Admitting: Physician Assistant

## 2022-09-24 ENCOUNTER — Ambulatory Visit (INDEPENDENT_AMBULATORY_CARE_PROVIDER_SITE_OTHER): Payer: Medicare HMO

## 2022-09-24 ENCOUNTER — Ambulatory Visit: Payer: Medicare HMO | Attending: Physician Assistant | Admitting: Physician Assistant

## 2022-09-24 VITALS — BP 118/73 | HR 84 | Resp 17 | Ht 60.0 in

## 2022-09-24 DIAGNOSIS — M25551 Pain in right hip: Secondary | ICD-10-CM | POA: Diagnosis not present

## 2022-09-24 DIAGNOSIS — Z79899 Other long term (current) drug therapy: Secondary | ICD-10-CM

## 2022-09-24 DIAGNOSIS — M0609 Rheumatoid arthritis without rheumatoid factor, multiple sites: Secondary | ICD-10-CM | POA: Diagnosis not present

## 2022-09-24 DIAGNOSIS — M2450 Contracture, unspecified joint: Secondary | ICD-10-CM

## 2022-09-24 DIAGNOSIS — J849 Interstitial pulmonary disease, unspecified: Secondary | ICD-10-CM | POA: Diagnosis not present

## 2022-09-24 DIAGNOSIS — Z8639 Personal history of other endocrine, nutritional and metabolic disease: Secondary | ICD-10-CM

## 2022-09-24 DIAGNOSIS — I10 Essential (primary) hypertension: Secondary | ICD-10-CM

## 2022-09-24 DIAGNOSIS — Z87442 Personal history of urinary calculi: Secondary | ICD-10-CM

## 2022-09-24 DIAGNOSIS — M1711 Unilateral primary osteoarthritis, right knee: Secondary | ICD-10-CM

## 2022-09-24 DIAGNOSIS — Z8619 Personal history of other infectious and parasitic diseases: Secondary | ICD-10-CM

## 2022-09-24 DIAGNOSIS — M1A09X0 Idiopathic chronic gout, multiple sites, without tophus (tophi): Secondary | ICD-10-CM | POA: Diagnosis not present

## 2022-09-24 DIAGNOSIS — M810 Age-related osteoporosis without current pathological fracture: Secondary | ICD-10-CM

## 2022-09-24 DIAGNOSIS — Z96652 Presence of left artificial knee joint: Secondary | ICD-10-CM

## 2022-09-24 LAB — CBC WITH DIFFERENTIAL/PLATELET
Absolute Monocytes: 672 cells/uL (ref 200–950)
Basophils Absolute: 101 cells/uL (ref 0–200)
Basophils Relative: 1.2 %
Eosinophils Absolute: 832 cells/uL — ABNORMAL HIGH (ref 15–500)
Eosinophils Relative: 9.9 %
HCT: 28.5 % — ABNORMAL LOW (ref 35.0–45.0)
Hemoglobin: 9 g/dL — ABNORMAL LOW (ref 11.7–15.5)
Lymphs Abs: 1529 cells/uL (ref 850–3900)
MCH: 29.1 pg (ref 27.0–33.0)
MCHC: 31.6 g/dL — ABNORMAL LOW (ref 32.0–36.0)
MCV: 92.2 fL (ref 80.0–100.0)
MPV: 13.7 fL — ABNORMAL HIGH (ref 7.5–12.5)
Monocytes Relative: 8 %
Neutro Abs: 5267 cells/uL (ref 1500–7800)
Neutrophils Relative %: 62.7 %
Platelets: 212 10*3/uL (ref 140–400)
RBC: 3.09 10*6/uL — ABNORMAL LOW (ref 3.80–5.10)
RDW: 15.6 % — ABNORMAL HIGH (ref 11.0–15.0)
Total Lymphocyte: 18.2 %
WBC: 8.4 10*3/uL (ref 3.8–10.8)

## 2022-09-24 MED ORDER — HYDROXYCHLOROQUINE SULFATE 200 MG PO TABS: ORAL_TABLET | ORAL | 0 refills | Status: DC

## 2022-09-24 MED ORDER — LEFLUNOMIDE 20 MG PO TABS: ORAL_TABLET | ORAL | 0 refills | Status: DC

## 2022-09-25 NOTE — Progress Notes (Signed)
Anemia has worsened--please forward results to PCP as requested.  Creatinine remains slightly elevated-1.12 and GFR is low 50.  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  We will continue to monitor.  Avoid the use of NSAIDs.   LDL and triglycerides are elevated.  Please notify the patient and forward results to her cardiologist as requested.

## 2022-09-25 NOTE — Progress Notes (Signed)
Findings consistent with moderate OA of the right hip.  Please notify the patient and her daughter.

## 2022-09-27 NOTE — Progress Notes (Signed)
Patient takes tylenol as needed for pain relief.  She also has a prescription for tramadol which will help to alleviate discomfort

## 2022-09-29 ENCOUNTER — Encounter: Payer: Self-pay | Admitting: Cardiovascular Disease

## 2022-09-29 ENCOUNTER — Telehealth: Payer: Self-pay | Admitting: Emergency Medicine

## 2022-09-29 NOTE — Telephone Encounter (Signed)
Faxed Medical Release and Request for d/c summary and CT when pt hospitalized this year 2024

## 2022-10-02 ENCOUNTER — Encounter: Payer: Self-pay | Admitting: Physician Assistant

## 2022-10-08 ENCOUNTER — Telehealth: Payer: Self-pay | Admitting: *Deleted

## 2022-10-08 DIAGNOSIS — Z8639 Personal history of other endocrine, nutritional and metabolic disease: Secondary | ICD-10-CM

## 2022-10-08 NOTE — Telephone Encounter (Signed)
-----   Message from [facility] sent at 10/04/2022 10:59 AM EDT ----- Received lipid profile  from her other provider. The LDL cholesterol was high at 133 (target is <70). Please confirm that she was taking the atorvastatin when these labs were drawn. If yes, please increase the atorvastatin to 40 mg daily and add ezetimibe 10 mg daily. Recheck a lipid profile in 2-3 months. If she was off the atorvastatin, please just recheck the labs in 2-3 months. ----- Message ----- From: Henriette Combs, LPN Sent: [id number]/[id number]   9:23 AM EDT To: Thurmon Fair, MD

## 2022-10-08 NOTE — Telephone Encounter (Signed)
Spoke with pt, she reports she was not taking atorvastatin when the lab work was done. She has increased to 40 mg and will have lab work repeated in 2-3 months. Lab orders mailed to the pt

## 2022-10-09 ENCOUNTER — Encounter: Payer: Self-pay | Admitting: Physical Medicine and Rehabilitation

## 2022-10-09 ENCOUNTER — Encounter: Payer: Medicare HMO | Attending: Physical Medicine and Rehabilitation | Admitting: Physical Medicine and Rehabilitation

## 2022-10-09 VITALS — BP 156/85 | HR 88 | Ht 60.0 in | Wt 150.0 lb

## 2022-10-09 DIAGNOSIS — M2450 Contracture, unspecified joint: Secondary | ICD-10-CM | POA: Insufficient documentation

## 2022-10-09 DIAGNOSIS — Z993 Dependence on wheelchair: Secondary | ICD-10-CM | POA: Insufficient documentation

## 2022-10-09 DIAGNOSIS — M0609 Rheumatoid arthritis without rheumatoid factor, multiple sites: Secondary | ICD-10-CM | POA: Diagnosis not present

## 2022-10-09 NOTE — Progress Notes (Signed)
Subjective:    Patient ID: Mary [name], female    DOB: Mar 01, 1941, 82 y.o.   MRN: [id number]  HPI  Pt is an 82 yr old female with seronegative RA who is nonambulatory with R foot pain; also has OA, gout and osteoporosis; Also has UIP fibrosis; also has HTN and DM- cannot check in computer- A1c not available- per pt, down to 6.    Sent here by Dr Nigel Sloop   Here for f/u of R foot/chronic pain.   Foot is good  Only uses Tramadol, when tylenol doesn't cover enough.   R foot pain is getting much better.   Maybe 1x/week taking Tramadol. Last took it Saturday or Sunday.  Just when tylenol won't touch. Thinks it's barometric pressure related.   Still nonambulatory- due to knee RA and OA.   Flipped from [employer] to [facility]- couldn't get through w/c- and they want her to get power w/c- to get PT w/c eval -waiting on that.   But they haven't ordered w/c yet. So still using Mary w/c that she cannot push.   No pressure ulcers.   Pain Inventory Average Pain 0 Pain Right Now 0 My pain is  no pain  In the last 24 hours, has pain interfered with the following? General activity 0 Relation with others 0 Enjoyment of life 0 What TIME of day is your pain at its worst? evening Sleep (in general) Good  Pain is worse with:  no pain Pain improves with: medication Relief from Meds: 6  Family History  Problem Relation Age of Onset   Arthritis Brother    Arthritis Sister    Arthritis Sister    Arthritis Brother    Arthritis Brother    Arthritis Brother    Social History   Socioeconomic History   Marital status: Married    Spouse name: Not on file   Number of children: Not on file   Years of education: Not on file   Highest education level: Not on file  Occupational History   Not on file  Tobacco Use   Smoking status: Never    Passive exposure: Past   Smokeless tobacco: Never  Vaping Use   Vaping status: Never Used  Substance and Sexual Activity   Alcohol use: No    Drug use: Never   Sexual activity: Not on file  Other Topics Concern   Not on file  Social History Narrative   Not on file   Social Determinants of Health   Financial Resource Strain: Not on file  Food Insecurity: Not on file  Transportation Needs: Not on file  Physical Activity: Not on file  Stress: Not on file  Social Connections: Not on file   Past Surgical History:  Procedure Laterality Date   ABDOMINAL HYSTERECTOMY     Past Surgical History:  Procedure Laterality Date   ABDOMINAL HYSTERECTOMY     Past Medical History:  Diagnosis Date   Complication of anesthesia    Diabetes mellitus without complication (HCC)    Dyspnea    Gout    High cholesterol    History of kidney stones    Hypertension    Hypoxia 08/2016   Osteoarthritis    PONV (postoperative nausea and vomiting)    Rheumatoid arthritis (HCC)    Ht 5' (1.524 m)   BMI 29.29 kg/m   Opioid Risk Score:   Fall Risk Score:  `1  Depression screen [facility] 2/9     06/17/2022   11:33 AM  Depression screen PHQ 2/9  Decreased Interest 0  Down, Depressed, Hopeless 0  PHQ - 2 Score 0  Altered sleeping 0  Tired, decreased energy 0  Change in appetite 0  Feeling bad or failure about yourself  0  Trouble concentrating 0  Moving slowly or fidgety/restless 0  Suicidal thoughts 0  PHQ-9 Score 0      Review of Systems  Musculoskeletal:        RT hip  All other systems reviewed and are negative.      Objective:   Physical Exam  Awake, alert, appropriate, hands cold and wrapped up; accompanied by family, NAD In Mary w/c- wearing special wide toe box shoes; knees in valgus- and hands contracted as they were last appt- all chronic changes.   Intact sensation ot light touch in all 4 extremities.         Assessment & Plan:    Pt is an 82 yr old female with seronegative RA who is nonambulatory with R foot pain; also has OA, gout and osteoporosis; Also has UIP fibrosis; also has HTN and DM- cannot  check in computer- A1c not available- per pt, down to 6.     Will get patient a new power w/c- has needed a PT w/c eval so can use a power w/c due to hands contractures- from RA.  Also has forming contractures of LE's as well-  She cannot push a Mary w/c, so really meets criteria due to severe RA for power w/c- with tilt in space for pressure relief and elevation to get in/out of bed- and w/c pressure relieving w/c cushion. Has intact sensation, and never had pressure ulcer. Working now with [facility].    2. Is required to see q3 months if taking tramadol unless PCP would write for it.   3. If PCP takes over Tramadol, can see q year to f/u on functional changes. Ask PCP about taking over Tramadol.   4. Call me if needs anything regarding [employer]- give me a contact to call since taking a long time.   5. F/U f/u with 3 months. To f/u on w/c and overall pain    I spent a total of  22  minutes on total care today- >50% coordination of care- due to  D/w pt and family about w/c issues and pain.

## 2022-10-09 NOTE — Patient Instructions (Signed)
Pt is an 82 yr old female with seronegative RA who is nonambulatory with R foot pain; also has OA, gout and osteoporosis; Also has UIP fibrosis; also has HTN and DM- cannot check in computer- A1c not available- per pt, down to 6.     Will get patient a new power w/c- has needed a PT w/c eval so can use a power w/c due to hands contractures- from RA.  Also has forming contractures of LE's as well-  She cannot push a manual w/c, so really meets criteria due to severe RA for power w/c- with tilt in space for pressure relief and elevation to get in/out of bed- and w/c pressure relieving w/c cushion. Has intact sensation, and never had pressure ulcer. Working now with [facility].    2. Is required to see q3 months if taking tramadol unless PCP would write for it.   3. If PCP takes over Tramadol, can see q year to f/u on functional changes. Ask PCP about taking over Tramadol.   4. Call me if needs anything regarding [employer]- give me a contact to call since taking a long time.   5. F/U f/u with 3 months. To f/u on w/c and overall pain

## 2022-10-12 ENCOUNTER — Ambulatory Visit: Payer: Medicare HMO | Admitting: Surgery

## 2022-10-12 ENCOUNTER — Encounter: Payer: Self-pay | Admitting: Physical Medicine and Rehabilitation

## 2022-10-12 ENCOUNTER — Encounter: Payer: Self-pay | Admitting: Surgery

## 2022-10-12 VITALS — BP 153/90 | HR 87 | Temp 97.7°F

## 2022-10-12 DIAGNOSIS — I723 Aneurysm of iliac artery: Secondary | ICD-10-CM

## 2022-10-12 NOTE — Progress Notes (Signed)
[facility]  Patient name: Mary [name] MRN: [id number] DOB: Jun 17, 1940 Sex: female   REQUESTING PROVIDER:    Drusilla Kanner   REASON FOR CONSULT:    Iliac aneurysm  HISTORY OF PRESENT ILLNESS:   Mary [name] is a 82 y.o. female, who is referred for evaluation of a 1.4 cm left internal iliac artery aneurysm that was detected on CT scan done for back pain and stomach pain.  Her pain symptoms have resolved.  Her biggest issue is right hip pain from her arthritis.  She has been nonambulatory for 3 years.  She does not have any foot pain.  She is a diabetic.  She takes a statin for hypercholesterolemia.  She is medically managed for hypertension.  PAST MEDICAL HISTORY    Past Medical History:  Diagnosis Date   Complication of anesthesia    Diabetes mellitus without complication (HCC)    Dyspnea    Gout    High cholesterol    History of kidney stones    Hypertension    Hypoxia 08/2016   Osteoarthritis    PONV (postoperative nausea and vomiting)    Rheumatoid arthritis (HCC)      FAMILY HISTORY   Family History  Problem Relation Age of Onset   Arthritis Brother    Arthritis Sister    Arthritis Sister    Arthritis Brother    Arthritis Brother    Arthritis Brother     SOCIAL HISTORY:   Social History   Socioeconomic History   Marital status: Married    Spouse name: Not on file   Number of children: Not on file   Years of education: Not on file   Highest education level: Not on file  Occupational History   Not on file  Tobacco Use   Smoking status: Never    Passive exposure: Past   Smokeless tobacco: Never  Vaping Use   Vaping status: Never Used  Substance and Sexual Activity   Alcohol use: No   Drug use: Never   Sexual activity: Not on file  Other Topics Concern   Not on file  Social History Narrative   Not on file   Social Determinants of Health   Financial Resource Strain:  Not on file  Food Insecurity: Not on file  Transportation Needs: Not on file  Physical Activity: Not on file  Stress: Not on file  Social Connections: Not on file  Intimate Partner Violence: Not on file    ALLERGIES:    Allergies  Allergen Reactions   Aspirin Other (See Comments)    Itching per pt   Codeine Nausea Only    CURRENT MEDICATIONS:    Current Outpatient Medications  Medication Sig Dispense Refill   acetaminophen (TYLENOL) 500 MG tablet as needed.      albuterol (PROVENTIL) (2.5 MG/3ML) 0.083% nebulizer solution Take 3 mLs (2.5 mg total) by nebulization every 6 (six) hours as needed for wheezing or shortness of breath. 360 mL 5   allopurinol (ZYLOPRIM) 100 MG tablet Take 200 mg by mouth daily.      amLODipine (NORVASC) 2.5 MG tablet Take 2.5 mg by mouth daily.     atorvastatin (LIPITOR) 20 MG tablet SMARTSIG:1 Tablet(s) By Mouth Every Evening     benazepril (LOTENSIN) 20 MG tablet Take 1 tablet (20 mg total) by mouth daily. 31 tablet 0   carvedilol (COREG) 6.25 MG tablet      Cholecalciferol (VITAMIN D3) 5000 units TABS Take 1 tablet  by mouth. Takes on Monday, Wednesday, and Friday.     clorazepate (TRANXENE) 7.5 MG tablet Take 7.5 mg by mouth 2 (two) times daily.      famotidine (PEPCID) 20 MG tablet Take 20 mg by mouth 2 (two) times daily.     FEROSUL 325 (65 Fe) MG tablet Take 325 mg by mouth daily.     hydroxychloroquine (PLAQUENIL) 200 MG tablet Take 1 tablet by mouth daily Monday-Fridays only. 60 tablet 0   ipratropium-albuterol (DUONEB) 0.5-2.5 (3) MG/3ML SOLN Take 3 mLs by nebulization 4 (four) times daily as needed. 360 mL 11   leflunomide (ARAVA) 20 MG tablet TAKE ONE (1) TABLET BY MOUTH EVERY DAY 90 tablet 0   levothyroxine (SYNTHROID, LEVOTHROID) 75 MCG tablet Take 75 mcg by mouth daily.      lidocaine (XYLOCAINE) 5 % ointment Apply 1 Application topically 4 (four) times daily as needed for mild pain. Can put on a layer like icing a cake and then saran  wrap it to stay on- 50 g 5   magnesium oxide (MAG-OX) 400 MG tablet Take 1 tablet by mouth daily.     metFORMIN (GLUCOPHAGE-XR) 500 MG 24 hr tablet Take 500 mg by mouth daily.     pantoprazole (PROTONIX) 20 MG tablet Take 20 mg by mouth 2 (two) times daily.     traMADol (ULTRAM) 50 MG tablet Take 1 tablet (50 mg total) by mouth 3 (three) times daily as needed for severe pain. Take when tylenol not enough 90 tablet 2   No current facility-administered medications for this visit.    REVIEW OF SYSTEMS:   [X]  denotes positive finding, [ ]  denotes negative finding Cardiac  Comments:  Chest pain or chest pressure:    Shortness of breath upon exertion:    Short of breath when lying flat:    Irregular heart rhythm:        Vascular    Pain in calf, thigh, or hip brought on by ambulation:    Pain in feet at night that wakes you up from your sleep:     Blood clot in your veins:    Leg swelling:         Pulmonary    Oxygen at home:    Productive cough:     Wheezing:         Neurologic    Sudden weakness in arms or legs:     Sudden numbness in arms or legs:     Sudden onset of difficulty speaking or slurred speech:    Temporary loss of vision in one eye:     Problems with dizziness:         Gastrointestinal    Blood in stool:      Vomited blood:         Genitourinary    Burning when urinating:     Blood in urine:        Psychiatric    Major depression:         Hematologic    Bleeding problems:    Problems with blood clotting too easily:        Skin    Rashes or ulcers:        Constitutional    Fever or chills:     PHYSICAL EXAM:   Vitals:   10/12/22 1057  BP: (!) 153/90  Pulse: 87  Temp: 97.7 F (36.5 C)  TempSrc: Temporal  SpO2: 90%    GENERAL: The patient is a  well-nourished female, in no acute distress. The vital signs are documented above. CARDIAC: There is a regular rate and rhythm.  VASCULAR: Palpable posterior tibial pulses bilaterally PULMONARY:  Nonlabored respirations ABDOMEN: Soft and non-tender  MUSCULOSKELETAL: There are no major deformities or cyanosis. NEUROLOGIC: No focal weakness or paresthesias are detected. SKIN: There are no ulcers or rashes noted. PSYCHIATRIC: The patient has a normal affect.  STUDIES:   I cannot review the images of the CT scan, however I have the report which shows a 14 mm left internal iliac aneurysm with aortic plaque.  ASSESSMENT and PLAN   Small internal iliac aneurysm on the left: Maximal diameter is 14 mm.  I discussed that I would consider repair once this becomes greater than 3 cm.  Because the location of the aneurysm, this will be difficult to follow-up with noninvasive imaging.  Therefore, I would like for her to get a CT scan and follow-up with me in 1 year.   Charlena Cross, MD, FACS [facility] [phone] Pager [phone]

## 2022-11-16 ENCOUNTER — Other Ambulatory Visit: Payer: Self-pay | Admitting: Physician Assistant

## 2022-11-23 ENCOUNTER — Other Ambulatory Visit: Payer: Self-pay | Admitting: Physician Assistant

## 2022-11-26 ENCOUNTER — Encounter: Payer: Self-pay | Admitting: Physical Medicine and Rehabilitation

## 2022-12-16 NOTE — Progress Notes (Unsigned)
Office Visit Note  Patient: Mary [name]             Date of Birth: 06/16/1940           MRN: [id number]             PCP: Erskine Emery, NP Referring: Erskine Emery, NP Visit Date: 12/30/2022 Occupation: @GUAROCC @  Subjective:  Medication monitoring   History of Present Illness: Mary [name] is a 82 y.o. female with history of seronegative rheumatoid arthritis, ILD, and gout.  Patient remains on Arava 20 mg 1 tablet by mouth daily and plaquenil 200 mg 1 tablet by mouth daily M-F.  She is to rating combination therapy without any side effects.  Patient states that she has been experiencing increased breakthrough symptoms on Saturday and Sundays when she is not able to take Plaquenil.  Patient states she has had increased discomfort in her right hip joint.  She remains primarily wheelchair-bound.  She has been taking Tylenol as needed for pain relief. Patient states that she has been under the care of of gastroenterology and has been diagnosed with a hiatal hernia.  She will not be undergoing a procedure but has been started on Protonix.  Activities of Daily Living:  Patient reports morning stiffness for 5 minutes.   Patient Reports nocturnal pain.  Difficulty dressing/grooming: Reports Difficulty climbing stairs: Reports Difficulty getting out of chair: Reports Difficulty using hands for taps, buttons, cutlery, and/or writing: Reports  Review of Systems  Constitutional:  Negative for fatigue.  HENT:  Negative for mouth sores and mouth dryness.   Eyes:  Negative for dryness.  Respiratory:  Negative for shortness of breath.   Cardiovascular:  Negative for chest pain and palpitations.  Gastrointestinal:  Positive for constipation. Negative for blood in stool and diarrhea.  Endocrine: Negative for increased urination.  Genitourinary:  Positive for involuntary urination.  Musculoskeletal:  Positive for joint pain, gait problem, joint pain, myalgias, morning stiffness, muscle  tenderness and myalgias. Negative for joint swelling and muscle weakness.  Skin:  Positive for sensitivity to sunlight. Negative for color change, rash and hair loss.  Allergic/Immunologic: Negative for susceptible to infections.  Neurological:  Negative for dizziness and headaches.  Hematological:  Negative for swollen glands.  Psychiatric/Behavioral:  Negative for depressed mood and sleep disturbance. The patient is nervous/anxious.     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 factor (HCC) 02/28/2016   High risk medication use 02/28/2016   Idiopathic chronic gout of multiple sites without tophus 02/28/2016   Primary osteoarthritis of both knees 02/28/2016   Contracture, joint, multiple sites 02/28/2016   History of hypothyroidism 02/28/2016   History of diabetes mellitus 02/28/2016   History of shingles 02/28/2016   History of hyperlipidemia 02/28/2016    Past Medical History:  Diagnosis Date   Anemia    Antritis (stomach)    Aortic aneurysm (HCC)    Complication of anesthesia    Diabetes mellitus without complication (HCC)    Dyspnea    Gout    Hiatal hernia    High cholesterol    History of kidney stones  Hypertension    Hypoxia 08/2016   Low back pain    Osteoarthritis    Osteopenia    PONV (postoperative nausea and vomiting)    Rheumatoid arthritis (HCC)     Family History  Problem Relation Age of Onset   Arthritis Brother    Arthritis Sister    Arthritis Sister    Arthritis Brother    Arthritis Brother    Arthritis Brother    Past Surgical  History:  Procedure Laterality Date   ABDOMINAL HYSTERECTOMY     Social History   Social History Narrative   Not on file   Immunization History  Administered Date(s) Administered   Fluad Quad(high Dose 65+) 12/01/2018, 11/19/2021   Influenza, High Dose Seasonal PF 11/26/2015   Influenza-Unspecified 12/07/2016   PFIZER(Purple Top)SARS-COV-2 Vaccination 04/07/2019, 04/28/2019   Pneumococcal-Unspecified 09/04/2015     Objective: Vital Signs: BP (!) 144/78 (BP Location: Left Arm, Patient Position: Sitting, Cuff Size: Normal)   Pulse 90   Resp 18   Ht 5\' 2"  (1.575 m)   BMI 27.44 kg/m    Physical Exam Vitals and nursing note reviewed.  Constitutional:      Appearance: She is well-developed.  HENT:     Head: Normocephalic and atraumatic.  Eyes:     Conjunctiva/sclera: Conjunctivae normal.  Cardiovascular:     Rate and Rhythm: Normal rate and regular rhythm.     Heart sounds: Normal heart sounds.  Pulmonary:     Effort: Pulmonary effort is normal.     Breath sounds: Normal breath sounds.     Comments: Crackles at lung bases Abdominal:     General: Bowel sounds are normal.     Palpations: Abdomen is soft.  Musculoskeletal:     Cervical back: Normal range of motion.  Skin:    General: Skin is warm and dry.     Capillary Refill: Capillary refill takes less than 2 seconds.  Neurological:     Mental Status: She is alert and oriented to person, place, and time.  Psychiatric:        Behavior: Behavior normal.      Musculoskeletal Exam: Patient remained seated in her wheelchair during the examination today.  C-spine has limited range of motion.  Thoracic kyphosis noted.  Limited range of motion of both shoulder joints.  Flexion contractures of both elbows.  Severely limited range of motion of both wrist joints.  Synovial thickening of MCP joints with ulnar deviation.  Incomplete fist formation and contractures of MCP and PIP joints noted.  Painful range of motion of the right  hip.  Limited extension of both knees.  Subluxation of both ankles.  CDAI Exam: CDAI Score: -- Patient Global: --; Provider Global: -- Swollen: --; Tender: -- Joint Exam 12/30/2022   No joint exam has been documented for this visit   There is currently no information documented on the homunculus. Go to the Rheumatology activity and complete the homunculus joint exam.  Investigation: No additional findings.  Imaging: No results found.  Recent Labs: Lab Results  Component Value Date   WBC 8.4 09/24/2022   HGB 9.0 (L) 09/24/2022   PLT 212 09/24/2022   NA 136 09/24/2022   K 4.6 09/24/2022   CL 107 09/24/2022   CO2 20 09/24/2022   GLUCOSE 102 (H) 09/24/2022   BUN 35 (H) 09/24/2022   CREATININE 1.12 (H) 09/24/2022   BILITOT 0.3 09/24/2022   ALKPHOS 87 04/10/2022   AST 11 09/24/2022   ALT 5 (L)  09/24/2022   PROT 7.0 09/24/2022   ALBUMIN 4.0 04/10/2022   CALCIUM 9.2 09/24/2022   GFRAA 95 03/26/2020    Speciality Comments: Prior therapy: Plaquenil (d/c per opthamologist recommendation) and SSZ (diarrhea) PLQ Eye Exam: 08/27/2022 [facility] Follow up 6 months  Please scheudle patient's appts. every 3 month per patient request for lab compliance.    Procedures:  No procedures performed Allergies: Aspirin and Codeine   Assessment / Plan:     Visit Diagnoses: Rheumatoid arthritis of multiple sites with negative rheumatoid factor (HCC) - Severe end-stage rheumatoid arthritis with multiple contractures.  She is wheelchair-bound: Patient continues to experience chronic pain involving multiple joints.  She is been experiencing some increased breakthrough symptoms on Saturday and Sundays when she does not take her Plaquenil.  Her pain has been most severe in the right hip.  She has been taking Tylenol as needed for pain relief.  Patient remains on Arava 20 mg 1 tablet by mouth daily and Plaquenil 200 mg 1 tablet by mouth daily Monday through Friday.  She is tolerating  combination therapy without any side effects.  Plan to check hydroxychloroquine blood level today and if within the subtherapeutic range we can consider increasing Plaquenil to 200 mg 1 tablet every day. - Plan: Hydroxychloroquine, Blood  ILD (interstitial lung disease) (HCC) - Presumed to be RA ILD: Patient was evaluated by Dr. Isaiah Serge on November 19, 2021.  Unable to obtain PFTs in the past.  Crackles auscultated at bilateral lung bases.  High risk medication use - Arava 20 mg 1 tablet by mouth daily and plaquenil 200 mg 1 tablet by mouth daily M-F.  CBC and CMP updated on 09/24/22.  Orders for CBC and CMP released.  She will continue to require updated lab work every 3 months. No recent or recurrent infections. Discussed the importance of holding arava if she develops signs or symptoms of an infection and to resume once the infection has completely cleared.  PLQ Eye Exam: 08/27/2022 [facility].   - Plan: CBC with Differential/Platelet, COMPLETE METABOLIC PANEL WITH GFR, Hydroxychloroquine, Blood  Idiopathic chronic gout of multiple sites without tophus -She has not had any signs or symptoms of a gout flare. She remains on allopurinol 200 mg daily for management of gout. uric acid: 4.9 on 04/10/2022.  Uric acid updated today.  - Plan: Uric acid  Primary osteoarthritis of right knee: Patient remains wheelchair-bound.  Chronic pain involving both knees.  S/P TKR (total knee replacement), left: Chronic pain.  Limited extension.   Age-related osteoporosis without current pathological fracture - DEXA on 06/18/2020 Right femoral neck BMD 0.593 with T score -2.3.  Patient has declined to schedule updated bone density at this time.    Flexion contractures - End stage rheumatoid arthritis. Flexion contracture ~10 degrees in both elbows.  Flexion contracture of both knees.  Patient is primarily wheelchair-bound.  Other medical conditions are listed as follows:   History of diabetes  mellitus  History of kidney stones  History of hyperlipidemia  History of shingles  Essential hypertension: Blood pressure was elevated today in the office and was rechecked prior to leaving.  History of hypothyroidism  History of anemia - Iron panel updated today.  Plan: Iron, TIBC and Ferritin Panel  Orders: Orders Placed This Encounter  Procedures   CBC with Differential/Platelet   COMPLETE METABOLIC PANEL WITH GFR   Uric acid   Iron, TIBC and Ferritin Panel   Hydroxychloroquine, Blood   No  orders of the defined types were placed in this encounter.   Follow-Up Instructions: Return in about 3 months (around 04/01/2023) for Rheumatoid arthritis.   Gearldine Bienenstock, PA-C  Note - This record has been created using Dragon software.  Chart creation errors have been sought, but may not always  have been located. Such creation errors do not reflect on  the standard of medical care.

## 2022-12-16 NOTE — Progress Notes (Unsigned)
12/17/2022 [name] [id number] [birth year]  Referring provider: Erskine Emery, NP Primary GI doctor: Dr. Adela Lank  ASSESSMENT AND PLAN:   Normocytic Anemia Worsening from hemoglobin 10.2 to 9. No overt GI bleeding. Likely slow indolent bleed, possibly from an AVM or malignancy. Patient has been on oral iron for a month. High risk for endoscopic evaluation due to rheumatoid arthritis with lung involvement and wheelchair dependency, also after candid discussion with patient and daughter, she is not interested in endoscopic evaluation even if this means a cancer is undetected. -Obtain iron studies from primary care provider. -Consider IV iron if oral iron is not effective. -Patient to contact office if overt GI bleeding occurs for possible in-hospital endoscopy or colonoscopy.  Hiatal Hernia Moderate size, identified on CT chest in March. Mild reflux and early satiety. Some improvement with Protonix 20mg . -Increase Protonix to 40mg  daily. -Provide gastroparesis diet for possible large hiatal hernia.  Constipation Likely secondary to decreased movement (wheelchair dependent) and medications. -Add Miralax and fiber.  Follow-up in 3-4 months.   Patient Care Team: Erskine Emery, NP as PCP - General  HISTORY OF PRESENT ILLNESS: 82 y.o. 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    05/19/2022 CT chest high-resolution with history of interstitial lung disease showed atherosclerosis of coronary arteries, moderate size hiatal hernia widespread patchy groundglass attenuation, septal thickening, subpleural reticulation, bronchiectasis scattered areas of honeycombing nonobstructing left kidney stone 1.8 cm right liver cyst likely benign no imaging recommended 09/24/2022 hemoglobin 9  compared to 10.2 three months prior, baseline appears to be around 10, MCV normocytic at 92 normal platelets no leukocytosis.  BUN 35, creatinine 1.12, liver function unremarkable  Discussed the use of AI scribe software for clinical note transcription with the patient, who gave verbal consent to proceed.  Patient in wheel chair dependent due to bilateral knees. She had a colonoscopy normal with Doctor in [place] 20 years ago, has not had another since that time. No family history of colon cancer.  She is not on oxygen.   She states she has always been anemic, had the colonoscopy years and years ago due to anemia.  She states that Dr. Titus Dubin has told her that her anemia is related to her RA, she is now on iron once daily for the last month. Started by [employer].  She states occ with discomfort she does not want to eat. She states she can have nausea with certain foods like [place] only, no vomiting. Denies GERD. She does have metformin get "hung on right throat" she can drink water and it helps, not consistent. Otherwise denies dysphagia. She does get full quickly, has been worse the last several months.  Denies melena or hematochezia.  She has BM every other day.   She  reports that she has never smoked. She has been exposed to tobacco smoke. She has never used smokeless tobacco. She reports current drug use. She reports that she does not drink alcohol.  RELEVANT LABS AND IMAGING:  Results   LABS Hb: 10.2  RADIOLOGY CT chest high resolution: Moderate sized hiatal hernia, mild reflux (05/2022)      CBC    Component Value Date/Time   WBC 8.4 09/24/2022 1410   RBC 3.09 (L) 09/24/2022 1410   HGB 9.0 (L) 09/24/2022 1410   HGB 9.8 (L) 04/10/2022 1148   HCT  28.5 (L) 09/24/2022 1410   HCT 31.2 (L) 04/10/2022 1148   PLT 212 09/24/2022 1410   PLT 191 04/10/2022 1148   MCV 92.2 09/24/2022 1410   MCV 89 04/10/2022 1148   MCH 29.1 09/24/2022 1410   MCHC 31.6 (L) 09/24/2022 1410   RDW  15.6 (H) 09/24/2022 1410   RDW 16.1 (H) 04/10/2022 1148   LYMPHSABS 1,529 09/24/2022 1410   LYMPHSABS 1.0 04/10/2022 1148   MONOABS 0.7 08/17/2017 1150   EOSABS 832 (H) 09/24/2022 1410   EOSABS 0.4 04/10/2022 1148   BASOSABS 101 09/24/2022 1410   BASOSABS 0.1 04/10/2022 1148   Recent Labs    01/27/22 1329 04/10/22 1148 06/24/22 1347 09/24/22 1410  HGB 9.9* 9.8* 10.2* 9.0*    CMP     Component Value Date/Time   NA 136 09/24/2022 1410   NA 138 04/10/2022 1148   K 4.6 09/24/2022 1410   CL 107 09/24/2022 1410   CO2 20 09/24/2022 1410   GLUCOSE 102 (H) 09/24/2022 1410   BUN 35 (H) 09/24/2022 1410   BUN 24 04/10/2022 1148   CREATININE 1.12 (H) 09/24/2022 1410   CALCIUM 9.2 09/24/2022 1410   PROT 7.0 09/24/2022 1410   PROT 6.7 04/10/2022 1148   ALBUMIN 4.0 04/10/2022 1148   AST 11 09/24/2022 1410   ALT 5 (L) 09/24/2022 1410   ALKPHOS 87 04/10/2022 1148   BILITOT 0.3 09/24/2022 1410   BILITOT <0.2 04/10/2022 1148   GFRNONAA 82 03/26/2020 0851   GFRNONAA 84 07/04/2019 1209   GFRAA 95 03/26/2020 0851   GFRAA 97 07/04/2019 1209      Latest Ref Rng & Units 09/24/2022    2:10 PM 06/24/2022    1:47 PM 04/10/2022   11:48 AM  Hepatic Function  Total Protein 6.1 - 8.1 g/dL 7.0  6.9  6.7   Albumin 3.8 - 4.8 g/dL   4.0   AST 10 - 35 U/L 11  11  14    ALT 6 - 29 U/L 5  8  8    Alk Phosphatase 44 - 121 IU/L   87   Total Bilirubin 0.2 - 1.2 mg/dL 0.3  0.2  <1.6       Current Medications:   Current Outpatient Medications (Endocrine & Metabolic):    levothyroxine (SYNTHROID, LEVOTHROID) 75 MCG tablet, Take 75 mcg by mouth daily.    metFORMIN (GLUCOPHAGE-XR) 500 MG 24 hr tablet, Take 500 mg by mouth daily.  Current Outpatient Medications (Cardiovascular):    amLODipine (NORVASC) 2.5 MG tablet, Take 2.5 mg by mouth daily.   atorvastatin (LIPITOR) 20 MG tablet, SMARTSIG:1 Tablet(s) By Mouth Every Evening   benazepril (LOTENSIN) 20 MG tablet, Take 1 tablet (20 mg total) by mouth  daily.   carvedilol (COREG) 6.25 MG tablet,   Current Outpatient Medications (Respiratory):    albuterol (PROVENTIL) (2.5 MG/3ML) 0.083% nebulizer solution, Take 3 mLs (2.5 mg total) by nebulization every 6 (six) hours as needed for wheezing or shortness of breath.   ipratropium-albuterol (DUONEB) 0.5-2.5 (3) MG/3ML SOLN, Take 3 mLs by nebulization 4 (four) times daily as needed.  Current Outpatient Medications (Analgesics):    acetaminophen (TYLENOL) 500 MG tablet, as needed.    allopurinol (ZYLOPRIM) 100 MG tablet, Take 200 mg by mouth daily.    leflunomide (ARAVA) 20 MG tablet, TAKE ONE (1) TABLET BY MOUTH EVERY DAY   traMADol (ULTRAM) 50 MG tablet, Take 1 tablet (50 mg total) by mouth 3 (three) times daily as needed for severe  pain. Take when tylenol not enough  Current Outpatient Medications (Hematological):    FEROSUL 325 (65 Fe) MG tablet, Take 325 mg by mouth daily.  Current Outpatient Medications (Other):    Cholecalciferol (VITAMIN D3) 5000 units TABS, Take 1 tablet by mouth. Takes on Monday, Wednesday, and Friday.   clorazepate (TRANXENE) 7.5 MG tablet, Take 7.5 mg by mouth 2 (two) times daily.    famotidine (PEPCID) 20 MG tablet, Take 20 mg by mouth 2 (two) times daily.   hydroxychloroquine (PLAQUENIL) 200 MG tablet, Take 1 tablet by mouth daily Monday-Fridays only.   lidocaine (XYLOCAINE) 5 % ointment, Apply 1 Application topically 4 (four) times daily as needed for mild pain. Can put on a layer like icing a cake and then saran wrap it to stay on-   magnesium oxide (MAG-OX) 400 MG tablet, Take 1 tablet by mouth daily.   pantoprazole (PROTONIX) 40 MG tablet, Take 1 tablet (40 mg total) by mouth 2 (two) times daily.  Medical History:  Past Medical History:  Diagnosis Date   Complication of anesthesia    Diabetes mellitus without complication (HCC)    Dyspnea    Gout    High cholesterol    History of kidney stones    Hypertension    Hypoxia 08/2016   Osteoarthritis     PONV (postoperative nausea and vomiting)    Rheumatoid arthritis (HCC)    Allergies:  Allergies  Allergen Reactions   Aspirin Other (See Comments)    Itching per pt   Codeine Nausea Only     Surgical History:  She  has a past surgical history that includes Abdominal hysterectomy. Family History:  Her family history includes Arthritis in her brother, brother, brother, brother, sister, and sister.  REVIEW OF SYSTEMS  : All other systems reviewed and negative except where noted in the History of Present Illness.  PHYSICAL EXAM: BP 120/86   Pulse 80  General Appearance: chronically ill appearing female, NAD Head:   Normocephalic and atraumatic. Eyes:  sclerae anicteric,conjunctive pink  Respiratory: Respiratory effort normal, decreased diffuse breath sounds with rhonchi Cardio: RRR with no MRGs. Peripheral pulses intact.  Abdomen: Soft,  Obese ,active bowel sounds. No tenderness . No masses. Rectal: declines Musculoskeletal: Full ROM, Not tested gait. With edema. Patient with mild contraction bilateral hands, with ulnar deviation, swelling Skin:  Dry and intact without significant lesions or rashes Neuro: Alert and  oriented x4;  No focal deficits. Psych:  Cooperative. Normal mood and affect.    Doree Albee, PA-C 11:49 AM

## 2022-12-17 ENCOUNTER — Ambulatory Visit: Payer: Medicare HMO | Admitting: Physician Assistant

## 2022-12-17 ENCOUNTER — Encounter: Payer: Self-pay | Admitting: Physician Assistant

## 2022-12-17 VITALS — BP 120/86 | HR 80

## 2022-12-17 DIAGNOSIS — D649 Anemia, unspecified: Secondary | ICD-10-CM | POA: Diagnosis not present

## 2022-12-17 DIAGNOSIS — K449 Diaphragmatic hernia without obstruction or gangrene: Secondary | ICD-10-CM

## 2022-12-17 DIAGNOSIS — K5904 Chronic idiopathic constipation: Secondary | ICD-10-CM | POA: Diagnosis not present

## 2022-12-17 DIAGNOSIS — M0609 Rheumatoid arthritis without rheumatoid factor, multiple sites: Secondary | ICD-10-CM | POA: Diagnosis not present

## 2022-12-17 DIAGNOSIS — M0510 Rheumatoid lung disease with rheumatoid arthritis of unspecified site: Secondary | ICD-10-CM

## 2022-12-17 MED ORDER — PANTOPRAZOLE SODIUM 40 MG PO TBEC: 40.0000 mg | DELAYED_RELEASE_TABLET | Freq: Two times a day (BID) | ORAL | 3 refills | Status: DC

## 2022-12-17 NOTE — Patient Instructions (Addendum)
Follow-up in 4-6 months. Office to contact you with an appointment.   Please take your proton pump inhibitor medication, protonix 40 mg once daily  Please take this medication 30 minutes to 1 hour before meals- this makes it more effective.  Avoid spicy and acidic foods Avoid fatty foods Limit your intake of coffee, tea, alcohol, and carbonated drinks Work to maintain a healthy weight Keep the head of the bed elevated at least 3 inches with blocks or a wedge pillow if you are having any nighttime symptoms Stay upright for 2 hours after eating Avoid meals and snacks three to four hours before bedtime  Miralax is an osmotic laxative.  It only brings more water into the stool.  This is safe to take daily.  Can take up to 17 gram of miralax twice a day.  Mix with juice or coffee.  Start 1 capful at night for 3-4 days and reassess your response in 3-4 days.  You can increase and decrease the dose based on your response.  Remember, it can take up to 3-4 days to take effect OR for the effects to wear off.   I often pair this with benefiber in the morning to help assure the stool is not too loose.   Hiatal Hernia  A hiatal hernia occurs when part of the stomach slides above the muscle that separates the abdomen from the chest (diaphragm). A person can be born with a hiatal hernia (congenital), or it may develop over time. In almost all cases of hiatal hernia, only the top part of the stomach pushes through the diaphragm. Many people have a hiatal hernia with no symptoms. The larger the hernia, the more likely it is that you will have symptoms. In some cases, a hiatal hernia allows stomach acid to flow back into the tube that carries food from your mouth to your stomach (esophagus). This may cause heartburn symptoms. The development of heartburn symptoms may mean that you have a condition called gastroesophageal reflux disease (GERD). What are the causes? This condition is caused by a weakness in  the opening (hiatus) where the esophagus passes through the diaphragm to attach to the upper part of the stomach. A person may be born with a weakness in the hiatus, or a weakness can develop over time. What increases the risk? This condition is more likely to develop in: Older people. Age is a major risk factor for a hiatal hernia, especially if you are over the age of 70. Pregnant women. People who are overweight. People who have frequent constipation. What are the signs or symptoms? Symptoms of this condition usually develop in the form of GERD symptoms. Symptoms include: Heartburn. Upset stomach (indigestion). Trouble swallowing. Coughing or wheezing. Wheezing is making high-pitched whistling sounds when you breathe. Sore throat. Chest pain. Nausea and vomiting. How is this diagnosed? This condition may be diagnosed during testing for GERD. Tests that may be done include: X-rays of your stomach or chest. An upper gastrointestinal (GI) series. This is an X-ray exam of your GI tract that is taken after you swallow a chalky liquid that shows up clearly on the X-ray. Endoscopy. This is a procedure to look into your stomach using a thin, flexible tube that has a tiny camera and light on the end of it. How is this treated? This condition may be treated by: Dietary and lifestyle changes to help reduce GERD symptoms. Medicines. These may include: Over-the-counter antacids. Medicines that make your stomach empty more quickly. Medicines  that block the production of stomach acid (H2 blockers). Stronger medicines to reduce stomach acid (proton pump inhibitors). Surgery to repair the hernia, if other treatments are not helping. If you have no symptoms, you may not need treatment. Follow these instructions at home: Lifestyle and activity Do not use any products that contain nicotine or tobacco. These products include cigarettes, chewing tobacco, and vaping devices, such as e-cigarettes. If you  need help quitting, ask your health care provider. Try to achieve and maintain a healthy body weight. Avoid putting pressure on your abdomen. Anything that puts pressure on your abdomen increases the amount of acid that may be pushed up into your esophagus. Avoid bending over, especially after eating. Raise the head of your bed by putting blocks under the legs. This keeps your head and esophagus higher than your stomach. Do not wear tight clothing around your chest or stomach. Try not to strain when having a bowel movement, when urinating, or when lifting heavy objects. Eating and drinking Avoid foods that can worsen GERD symptoms. These may include: Fatty foods, like fried foods. Citrus fruits, like oranges or lemon. Other foods and drinks that contain acid, like orange juice or tomatoes. Spicy food. Chocolate. Eat frequent small meals instead of three large meals a day. This helps prevent your stomach from getting too full. Eat slowly. Do not lie down right after eating. Do not eat 1-2 hours before bed. Do not drink beverages with caffeine. These include cola, coffee, cocoa, and tea. Do not drink alcohol. General instructions Take over-the-counter and prescription medicines only as told by your health care provider. Keep all follow-up visits. Your health care provider will want to check that any new prescribed medicines are helping your symptoms. Contact a health care provider if: Your symptoms are not controlled with medicines or lifestyle changes. You are having trouble swallowing. You have coughing or wheezing that will not go away. Your pain is getting worse. Your pain spreads to your arms, neck, jaw, teeth, or back. You feel nauseous or you vomit. Get help right away if: You have shortness of breath. You vomit blood. You have bright red blood in your stools. You have black, tarry stools. These symptoms may be an emergency. Get help right away. Call 911. Do not wait to see  if the symptoms will go away. Do not drive yourself to the hospital. Summary A hiatal hernia occurs when part of the stomach slides above the muscle that separates the abdomen from the chest. A person may be born with a weakness in the hiatus, or a weakness can develop over time. Symptoms of a hiatal hernia may include heartburn, trouble swallowing, or sore throat. Management of a hiatal hernia includes eating frequent small meals instead of three large meals a day. Get help right away if you vomit blood, have bright red blood in your stools, or have black, tarry stools. This information is not intended to replace advice given to you by your health care provider. Make sure you discuss any questions you have with your health care provider. Document Revised: 04/15/2021 Document Reviewed: 04/15/2021 Elsevier Patient Education  2024 Elsevier Inc.  You can add on protein and things high calorie for weight gain - Can add ensure/boost to ice cream for a shake - can add protein powder to oatmeal after cooked or to a fruit smooth - avocado has high calorie, good to add to proteins - nuts and peanut butter are good to eat or add to yogurt/smoothies - [place]  yogurt is good  Gastroparesis Please do small frequent meals like 4-6 meals a day.  Eat and drink liquids at separate times.  Avoid high fiber foods, cook your vegetables, avoid high fat food.  Suggest spreading protein throughout the day (greek yogurt, glucerna, soft meat, milk, eggs) Choose soft foods that you can mash with a fork When you are more symptomatic, change to pureed foods foods and liquids.  Consider reading "Living well with Gastroparesis" by Reuel Derby Gastroparesis is a condition in which food takes longer than normal to empty from the stomach. This condition is also known as delayed gastric emptying. It is usually a long-term (chronic) condition. There is no cure, but there are treatments and things that you can do at  home to help relieve symptoms. Treating the underlying condition that causes gastroparesis can also help relieve symptoms What are the causes? In many cases, the cause of this condition is not known. Possible causes include: A hormone (endocrine) disorder, such as hypothyroidism or diabetes. A nervous system disease, such as Parkinson's disease or multiple sclerosis. Cancer, infection, or surgery that affects the stomach or vagus nerve. The vagus nerve runs from your chest, through your neck, and to the lower part of your brain. A connective tissue disorder, such as scleroderma. Certain medicines. What increases the risk? You are more likely to develop this condition if: You have certain disorders or diseases. These may include: An endocrine disorder. An eating disorder. Amyloidosis. Scleroderma. Parkinson's disease. Multiple sclerosis. Cancer or infection of the stomach or the vagus nerve. You have had surgery on your stomach or vagus nerve. You take certain medicines. You are female. What are the signs or symptoms? Symptoms of this condition include: Feeling full after eating very little or a loss of appetite. Nausea, vomiting, or heartburn. Bloating of your abdomen. Inconsistent blood sugar (glucose) levels on blood tests. Unexplained weight loss. Acid from the stomach coming up into the esophagus (gastroesophageal reflux). Sudden tightening (spasm) of the stomach, which can be painful. Symptoms may come and go. Some people may not notice any symptoms. How is this diagnosed? This condition is diagnosed with tests, such as: Tests that check how long it takes food to move through the stomach and intestines. These tests include: Upper gastrointestinal (GI) series. For this test, you drink a liquid that shows up well on X-rays, and then X-rays are taken of your intestines. Gastric emptying scintigraphy. For this test, you eat food that contains a small amount of radioactive  material, and then scans are taken. Wireless capsule GI monitoring system. For this test, you swallow a pill (capsule) that records information about how foods and fluid move through your stomach. Gastric manometry. For this test, a tube is passed down your throat and into your stomach to measure electrical and muscular activity. Endoscopy. For this test, a long, thin tube with a camera and light on the end is passed down your throat and into your stomach to check for problems in your stomach lining. Ultrasound. This test uses sound waves to create images of the inside of your body. This can help rule out gallbladder disease or pancreatitis as a cause of your symptoms. How is this treated? There is no cure for this condition, but treatment and home care may relieve symptoms. Treatment may include: Treating the underlying cause. Managing your symptoms by making changes to your diet and exercise habits. Taking medicines to control nausea and vomiting and to stimulate stomach muscles. Getting food through a feeding  tube in the hospital. This may be done in severe cases. Having surgery to insert a device called a gastric electrical stimulator into your body. This device helps improve stomach emptying and control nausea and vomiting. Follow these instructions at home: Take over-the-counter and prescription medicines only as told by your health care provider. Follow instructions from your health care provider about eating or drinking restrictions. Your health care provider may recommend that you: Eat smaller meals more often. Eat low-fat foods. Eat low-fiber forms of high-fiber foods. For example, eat cooked vegetables instead of raw vegetables. Have only liquid foods instead of solid foods. Liquid foods are easier to digest. Drink enough fluid to keep your urine pale yellow. Exercise as often as told by your health care provider. Keep all follow-up visits. This is important. Contact a health care  provider if you: Notice that your symptoms do not improve with treatment. Have new symptoms. Get help right away if you: Have severe pain in your abdomen that does not improve with treatment. Have nausea that is severe or does not go away. Vomit every time you drink fluids. Summary Gastroparesis is a long-term (chronic) condition in which food takes longer than normal to empty from the stomach. Symptoms include nausea, vomiting, heartburn, bloating of your abdomen, and loss of appetite. Eating smaller portions, low-fat foods, and low-fiber forms of high-fiber foods may help you manage your symptoms. Get help right away if you have severe pain in your abdomen. This information is not intended to replace advice given to you by your health care provider. Make sure you discuss any questions you have with your health care provider. Document Revised: 06/26/2019 Document Reviewed: 06/26/2019 Elsevier Patient Education  2021 [employer].

## 2022-12-18 NOTE — Progress Notes (Signed)
Agree with assessment plan as outlined.  Under normal circumstances would consider EGD and colonoscopy to evaluate this her anemia.  Sounds like after discussion with patient and family they are declining endoscopic evaluation, understanding risks of doing so.  Continue supportive measures and trend hemoglobin.  If she changes her mind about endoscopic procedures they can let us know and we can discuss scheduling.

## 2022-12-21 ENCOUNTER — Other Ambulatory Visit: Payer: Self-pay | Admitting: Physician Assistant

## 2022-12-21 ENCOUNTER — Telehealth: Payer: Self-pay | Admitting: Physical Medicine and Rehabilitation

## 2022-12-21 NOTE — Telephone Encounter (Signed)
[facility] patients insurance agent called in states patient has not been able to obtain her wheelchair because patients insurance [employer] needs a statement/letter needing to know why patient needs a new wheelchair .  Authorization department-- Fax # [phone], Francine Graven is requesting an authorization sent in to them with a procedure code for them to authorize wheelchair

## 2022-12-21 NOTE — Telephone Encounter (Signed)
Last Fill: 09/24/2022  Eye exam: 08/27/2022 WNL   Labs: 09/24/2022 Anemia has worsened Creatinine remains slightly elevated-1.12 and GFR is low 50.   Next Visit: 12/30/2022  Last Visit: 09/24/2022  [id number] arthritis of multiple sites with negative rheumatoid factor   Current Dose per office note 09/24/2022: plaquenil 200 mg 1 tablet by mouth daily M-F   Okay to refill Plaquenil?

## 2022-12-23 NOTE — Telephone Encounter (Signed)
Letter faxed.

## 2022-12-30 ENCOUNTER — Encounter: Payer: Self-pay | Admitting: Physician Assistant

## 2022-12-30 ENCOUNTER — Ambulatory Visit: Payer: Medicare HMO | Attending: Physician Assistant | Admitting: Physician Assistant

## 2022-12-30 VITALS — BP 140/81 | HR 88 | Resp 18 | Ht 62.0 in

## 2022-12-30 DIAGNOSIS — M2450 Contracture, unspecified joint: Secondary | ICD-10-CM

## 2022-12-30 DIAGNOSIS — M1A09X0 Idiopathic chronic gout, multiple sites, without tophus (tophi): Secondary | ICD-10-CM

## 2022-12-30 DIAGNOSIS — M0609 Rheumatoid arthritis without rheumatoid factor, multiple sites: Secondary | ICD-10-CM

## 2022-12-30 DIAGNOSIS — Z8619 Personal history of other infectious and parasitic diseases: Secondary | ICD-10-CM

## 2022-12-30 DIAGNOSIS — Z8639 Personal history of other endocrine, nutritional and metabolic disease: Secondary | ICD-10-CM

## 2022-12-30 DIAGNOSIS — M1711 Unilateral primary osteoarthritis, right knee: Secondary | ICD-10-CM

## 2022-12-30 DIAGNOSIS — Z79899 Other long term (current) drug therapy: Secondary | ICD-10-CM | POA: Diagnosis not present

## 2022-12-30 DIAGNOSIS — J849 Interstitial pulmonary disease, unspecified: Secondary | ICD-10-CM

## 2022-12-30 DIAGNOSIS — Z96652 Presence of left artificial knee joint: Secondary | ICD-10-CM

## 2022-12-30 DIAGNOSIS — M810 Age-related osteoporosis without current pathological fracture: Secondary | ICD-10-CM

## 2022-12-30 DIAGNOSIS — Z862 Personal history of diseases of the blood and blood-forming organs and certain disorders involving the immune mechanism: Secondary | ICD-10-CM

## 2022-12-30 DIAGNOSIS — Z87442 Personal history of urinary calculi: Secondary | ICD-10-CM

## 2022-12-30 DIAGNOSIS — I10 Essential (primary) hypertension: Secondary | ICD-10-CM

## 2022-12-31 NOTE — Progress Notes (Signed)
Creatinine remains elevated but has improved.  GFR is low but improving-53. Glucose is 122.  Rest of CMP WNL. Iron panel Wnl  Uric acid WNL

## 2022-12-31 NOTE — Progress Notes (Signed)
CBC stable--RBC count, hgb, and hct remain low.  Please forward iron panel and CBC results to GI provider as requested.

## 2023-01-03 ENCOUNTER — Encounter: Payer: Self-pay | Admitting: Physical Medicine and Rehabilitation

## 2023-01-06 LAB — CBC WITH DIFFERENTIAL/PLATELET
Absolute Lymphocytes: 1278 {cells}/uL (ref 850–3900)
Absolute Monocytes: 598 {cells}/uL (ref 200–950)
Basophils Absolute: 100 {cells}/uL (ref 0–200)
Basophils Relative: 1.2 %
Eosinophils Absolute: 730 {cells}/uL — ABNORMAL HIGH (ref 15–500)
Eosinophils Relative: 8.8 %
HCT: 29.8 % — ABNORMAL LOW (ref 35.0–45.0)
Hemoglobin: 9.3 g/dL — ABNORMAL LOW (ref 11.7–15.5)
MCH: 29.7 pg (ref 27.0–33.0)
MCHC: 31.2 g/dL — ABNORMAL LOW (ref 32.0–36.0)
MCV: 95.2 fL (ref 80.0–100.0)
MPV: 13.5 fL — ABNORMAL HIGH (ref 7.5–12.5)
Monocytes Relative: 7.2 %
Neutro Abs: 5594 {cells}/uL (ref 1500–7800)
Neutrophils Relative %: 67.4 %
Platelets: 238 10*3/uL (ref 140–400)
RBC: 3.13 10*6/uL — ABNORMAL LOW (ref 3.80–5.10)
RDW: 15.7 % — ABNORMAL HIGH (ref 11.0–15.0)
Total Lymphocyte: 15.4 %
WBC: 8.3 10*3/uL (ref 3.8–10.8)

## 2023-01-06 LAB — COMPLETE METABOLIC PANEL WITH GFR
AG Ratio: 1.4 (calc) (ref 1.0–2.5)
ALT: 7 U/L (ref 6–29)
AST: 12 U/L (ref 10–35)
Albumin: 4.1 g/dL (ref 3.6–5.1)
Alkaline phosphatase (APISO): 79 U/L (ref 37–153)
BUN/Creatinine Ratio: 29 (calc) — ABNORMAL HIGH (ref 6–22)
BUN: 31 mg/dL — ABNORMAL HIGH (ref 7–25)
CO2: 22 mmol/L (ref 20–32)
Calcium: 9.3 mg/dL (ref 8.6–10.4)
Chloride: 108 mmol/L (ref 98–110)
Creat: 1.06 mg/dL — ABNORMAL HIGH (ref 0.60–0.95)
Globulin: 3 g/dL (ref 1.9–3.7)
Glucose, Bld: 122 mg/dL — ABNORMAL HIGH (ref 65–99)
Potassium: 5.1 mmol/L (ref 3.5–5.3)
Sodium: 138 mmol/L (ref 135–146)
Total Bilirubin: 0.3 mg/dL (ref 0.2–1.2)
Total Protein: 7.1 g/dL (ref 6.1–8.1)
eGFR: 53 mL/min/{1.73_m2} — ABNORMAL LOW (ref >=60)

## 2023-01-06 LAB — URIC ACID: Uric Acid, Serum: 5.5 mg/dL (ref 2.5–7.0)

## 2023-01-06 LAB — HYDROXYCHLOROQUINE,BLOOD: HYDROXYCHLOROQUINE, (B): 1100 ng/mL — ABNORMAL HIGH

## 2023-01-06 LAB — IRON,TIBC AND FERRITIN PANEL
%SAT: 19 % (ref 16–45)
Ferritin: 184 ng/mL (ref 16–288)
Iron: 50 ug/dL (ref 45–160)
TIBC: 264 ug/dL (ref 250–450)

## 2023-01-07 NOTE — Progress Notes (Signed)
Hydroxychloroquine level is 1,100--continue current dose of plaquenil for now-once daily Monday through Friday

## 2023-01-13 ENCOUNTER — Encounter: Payer: Medicare HMO | Admitting: Physical Medicine and Rehabilitation

## 2023-01-18 NOTE — Telephone Encounter (Signed)
Spoke with Zenia Resides with [employer] they are taking care of the wheelchair for the patient.  If you have any questions please call her at [phone].

## 2023-02-16 ENCOUNTER — Other Ambulatory Visit: Payer: Self-pay | Admitting: Physician Assistant

## 2023-02-16 NOTE — Telephone Encounter (Signed)
Last Fill: 725/2024  Labs: 12/30/2022  Creatinine remains elevated but has improved.  GFR is low but improving-53. Glucose is 122.  Rest of CMP WNL. Iron panel Wnl Uric acid WNL CBC stable--RBC count, hgb, and hct remain low.  Please forward iron panel and CBC results to GI provider as requested.  Hydroxychloroquine level is 1,100--continue current dose of plaquenil for now-once daily Monday through Friday   Next Visit: 04/01/2023  Last Visit: 12/30/2022  DX: Rheumatoid arthritis of multiple sites with negative rheumatoid factor   Current Dose per office note 12/30/2022: Arava 20 mg 1 tablet by mouth daily   Okay to refill Arava ?

## 2023-02-25 ENCOUNTER — Telehealth: Payer: Self-pay | Admitting: Pulmonary Disease

## 2023-02-25 DIAGNOSIS — J849 Interstitial pulmonary disease, unspecified: Secondary | ICD-10-CM

## 2023-02-25 NOTE — Telephone Encounter (Signed)
[facility] [employer] calling req a refill for this PT Duoneb.  [facility] [phone]

## 2023-02-26 MED ORDER — IPRATROPIUM-ALBUTEROL 0.5-2.5 (3) MG/3ML IN SOLN: 3.0000 mL | Freq: Four times a day (QID) | RESPIRATORY_TRACT | 11 refills | Status: AC | PRN

## 2023-02-26 NOTE — Telephone Encounter (Signed)
I have sent refill for Duoneb to [facility] Nothing further needed

## 2023-03-18 NOTE — Progress Notes (Deleted)
 Office Visit Note  Patient: Mary [name]             Date of Birth: 02/20/1941           MRN: [id number]             PCP: Erskine Emery, NP Referring: Erskine Emery, NP Visit Date: 04/01/2023 Occupation: @GUAROCC @  Subjective:    History of Present Illness: Mary [name] is a 83 y.o. female with history of seronegative rheumatoid arthritis and gout.  Patient remains on Arava 20 mg 1 tablet by mouth daily and plaquenil 200 mg 1 tablet by mouth daily M-F.    CBC and CMP were drawn on 12/30/2022.  Orders for CBC and CMP were released today PLQ Eye Exam: 08/27/2022 WNL [facility]   Activities of Daily Living:  Patient reports morning stiffness for *** {minute/hour:19697}.   Patient {ACTIONS;DENIES/REPORTS:21021675::"Denies"} nocturnal pain.  Difficulty dressing/grooming: {ACTIONS;DENIES/REPORTS:21021675::"Denies"} Difficulty climbing stairs: {ACTIONS;DENIES/REPORTS:21021675::"Denies"} Difficulty getting out of chair: {ACTIONS;DENIES/REPORTS:21021675::"Denies"} Difficulty using hands for taps, buttons, cutlery, and/or writing: {ACTIONS;DENIES/REPORTS:21021675::"Denies"}  No Rheumatology ROS completed.   PMFS History:  Patient Active Problem List   Diagnosis Date Noted  . Wheelchair dependent 06/17/2022  . Mild persistent asthma without complication 11/18/2017  . Age-related osteoporosis without current pathological fracture 11/19/2016  . Acute respiratory failure (HCC)   . Rheumatoid lung (HCC)   . Hypoxia 09/03/2016  . Essential hypertension 09/03/2016  . Urinary tract infection 09/03/2016  . 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 High risk medication use 02/28/2016  . Idiopathic chronic gout of multiple sites without tophus 02/28/2016  . Primary osteoarthritis of both knees 02/28/2016  . Contracture, joint, multiple sites 02/28/2016  . History of hypothyroidism 02/28/2016  . History of diabetes mellitus  02/28/2016  . History of shingles 02/28/2016  . History of hyperlipidemia 02/28/2016    Past Medical History:  Diagnosis Date  . Anemia   . Antritis (stomach)   . Aortic aneurysm (HCC)   . Complication of anesthesia   . Diabetes mellitus without complication (HCC)   . Dyspnea   . Gout   . Hiatal hernia   . High cholesterol   . History of kidney stones   . Hypertension   . Hypoxia 08/2016  . Low back pain   . Osteoarthritis   . Osteopenia   . PONV (postoperative nausea and vomiting)   . Rheumatoid arthritis (HCC)     Family History  Problem Relation Age of Onset  . Arthritis Brother   . Arthritis Sister   . Arthritis Sister   . Arthritis Brother   . Arthritis Brother   . Arthritis Brother    Past Surgical History:  Procedure Laterality Date  . ABDOMINAL HYSTERECTOMY     Social History   Social History Narrative  . Not on file   Immunization History  Administered Date(s) Administered  . Fluad Quad(high Dose 65+) 12/01/2018, 11/19/2021  . Influenza, High Dose Seasonal PF 11/26/2015  . Influenza-Unspecified 12/07/2016  . PFIZER(Purple Top)SARS-COV-2 Vaccination 04/07/2019, 04/28/2019  . Pneumococcal-Unspecified 09/04/2015     Objective: Vital Signs: There were no vitals taken for this visit.   Physical Exam Vitals and nursing note reviewed.  Constitutional:      Appearance: She is well-developed.  HENT:     Head: Normocephalic and atraumatic.  Eyes:     Conjunctiva/sclera: Conjunctivae normal.  Cardiovascular:     Rate and Rhythm: Normal rate and regular rhythm.  Heart sounds: Normal heart sounds.  Pulmonary:     Effort: Pulmonary effort is normal.     Breath sounds: Normal breath sounds.  Abdominal:     General: Bowel sounds are normal.     Palpations: Abdomen is soft.  Musculoskeletal:     Cervical back: Normal range of motion.  Lymphadenopathy:     Cervical: No cervical adenopathy.  Skin:    General: Skin is warm and dry.     Capillary  Refill: Capillary refill takes less than 2 seconds.  Neurological:     Mental Status: She is alert and oriented to person, place, and time.  Psychiatric:        Behavior: Behavior normal.     Musculoskeletal Exam: ***  CDAI Exam: CDAI Score: -- Patient Global: --; Provider Global: -- Swollen: --; Tender: -- Joint Exam 04/01/2023   No joint exam has been documented for this visit   There is currently no information documented on the homunculus. Go to the Rheumatology activity and complete the homunculus joint exam.  Investigation: No additional findings.  Imaging: No results found.  Recent Labs: Lab Results  Component Value Date   WBC 8.3 12/30/2022   HGB 9.3 (L) 12/30/2022   PLT 238 12/30/2022   NA 138 12/30/2022   K 5.1 12/30/2022   CL 108 12/30/2022   CO2 22 12/30/2022   GLUCOSE 122 (H) 12/30/2022   BUN 31 (H) 12/30/2022   CREATININE 1.06 (H) 12/30/2022   BILITOT 0.3 12/30/2022   ALKPHOS 87 04/10/2022   AST 12 12/30/2022   ALT 7 12/30/2022   PROT 7.1 12/30/2022   ALBUMIN 4.0 04/10/2022   CALCIUM 9.3 12/30/2022   GFRAA 95 03/26/2020    Speciality Comments: Prior therapy: Plaquenil (d/c per opthamologist recommendation) and SSZ (diarrhea) PLQ Eye Exam: 08/27/2022 WNL [facility] Follow up 6 months  Please scheudle patient's appts. every 3 month per patient request for lab compliance.    Procedures:  No procedures performed Allergies: Aspirin and Codeine   Assessment / Plan:     Visit Diagnoses: Rheumatoid arthritis of multiple sites with negative rheumatoid factor (HCC)  ILD (interstitial lung disease) (HCC)  High risk medication use  Idiopathic chronic gout of multiple sites without tophus  Primary osteoarthritis of right knee  S/P TKR (total knee replacement), left  Flexion contractures  Age-related osteoporosis without current pathological fracture  History of diabetes mellitus  History of kidney stones  History of  hyperlipidemia  History of shingles  Essential hypertension  History of hypothyroidism  History of anemia  Orders: No orders of the defined types were placed in this encounter.  No orders of the defined types were placed in this encounter.   Face-to-face time spent with patient was *** minutes. Greater than 50% of time was spent in counseling and coordination of care.  Follow-Up Instructions: No follow-ups on file.   Gearldine Bienenstock, PA-C  Note - This record has been created using Dragon software.  Chart creation errors have been sought, but may not always  have been located. Such creation errors do not reflect on  the standard of medical care.

## 2023-03-23 ENCOUNTER — Other Ambulatory Visit: Payer: Self-pay | Admitting: Physician Assistant

## 2023-03-23 NOTE — Telephone Encounter (Signed)
Last Fill: 12/21/2022  Eye exam: 08/27/2022 WNL   Labs: 12/30/2022 CBC stable--RBC count, hgb, and hct remain low. Creatinine remains elevated but has improved.  GFR is low but improving-53. Glucose is 122.  Rest of CMP WNL. 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   Okay to refill Plaquenil?

## 2023-03-25 ENCOUNTER — Encounter: Payer: Self-pay | Admitting: Physician Assistant

## 2023-04-01 ENCOUNTER — Ambulatory Visit: Payer: Medicare HMO | Admitting: Physician Assistant

## 2023-04-01 DIAGNOSIS — M1A09X0 Idiopathic chronic gout, multiple sites, without tophus (tophi): Secondary | ICD-10-CM

## 2023-04-01 DIAGNOSIS — M810 Age-related osteoporosis without current pathological fracture: Secondary | ICD-10-CM

## 2023-04-01 DIAGNOSIS — Z8639 Personal history of other endocrine, nutritional and metabolic disease: Secondary | ICD-10-CM

## 2023-04-01 DIAGNOSIS — J849 Interstitial pulmonary disease, unspecified: Secondary | ICD-10-CM

## 2023-04-01 DIAGNOSIS — M2450 Contracture, unspecified joint: Secondary | ICD-10-CM

## 2023-04-01 DIAGNOSIS — I10 Essential (primary) hypertension: Secondary | ICD-10-CM

## 2023-04-01 DIAGNOSIS — Z8619 Personal history of other infectious and parasitic diseases: Secondary | ICD-10-CM

## 2023-04-01 DIAGNOSIS — Z96652 Presence of left artificial knee joint: Secondary | ICD-10-CM

## 2023-04-01 DIAGNOSIS — Z79899 Other long term (current) drug therapy: Secondary | ICD-10-CM

## 2023-04-01 DIAGNOSIS — M1711 Unilateral primary osteoarthritis, right knee: Secondary | ICD-10-CM

## 2023-04-01 DIAGNOSIS — M0609 Rheumatoid arthritis without rheumatoid factor, multiple sites: Secondary | ICD-10-CM

## 2023-04-01 DIAGNOSIS — Z87442 Personal history of urinary calculi: Secondary | ICD-10-CM

## 2023-04-01 DIAGNOSIS — Z862 Personal history of diseases of the blood and blood-forming organs and certain disorders involving the immune mechanism: Secondary | ICD-10-CM

## 2023-04-02 NOTE — Progress Notes (Deleted)
 Office Visit Note  Patient: Mary [name]             Date of Birth: January 31, 1941           MRN: [id number]             PCP: Erskine Emery, NP Referring: Erskine Emery, NP Visit Date: 04/14/2023 Occupation: @GUAROCC @  Subjective:    History of Present Illness: Mary [name] is a 83 y.o. female with history of seronegative rheumatoid arthritis and gout.  Patient remains on Arava 20 mg 1 tablet by mouth daily and plaquenil 200 mg 1 tablet by mouth daily M-F.    CBC and CMP were drawn on 12/30/2022.  Orders for CBC and CMP were released today PLQ Eye Exam: 08/27/2022 [facility]   Activities of Daily Living:  Patient reports morning stiffness for *** {minute/hour:19697}.   Patient {ACTIONS;DENIES/REPORTS:21021675::"Denies"} nocturnal pain.  Difficulty dressing/grooming: {ACTIONS;DENIES/REPORTS:21021675::"Denies"} Difficulty climbing stairs: {ACTIONS;DENIES/REPORTS:21021675::"Denies"} Difficulty getting out of chair: {ACTIONS;DENIES/REPORTS:21021675::"Denies"} Difficulty using hands for taps, buttons, cutlery, and/or writing: {ACTIONS;DENIES/REPORTS:21021675::"Denies"}  No Rheumatology ROS completed.   PMFS History:  Patient Active Problem List   Diagnosis Date Noted  . Wheelchair dependent 06/17/2022  . Mild persistent asthma without complication 11/18/2017  . Age-related osteoporosis without current pathological fracture 11/19/2016  . Acute respiratory failure (HCC)   . Rheumatoid lung (HCC)   . Hypoxia 09/03/2016  . Essential hypertension 09/03/2016  . Urinary tract infection 09/03/2016  . 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 High risk medication use 02/28/2016  . Idiopathic chronic gout of multiple sites without tophus 02/28/2016  . Primary osteoarthritis of both knees 02/28/2016  . Contracture, joint, multiple sites 02/28/2016  . History of hypothyroidism 02/28/2016  . History of diabetes mellitus  02/28/2016  . History of shingles 02/28/2016  . History of hyperlipidemia 02/28/2016    Past Medical History:  Diagnosis Date  . Anemia   . Antritis (stomach)   . Aortic aneurysm (HCC)   . Complication of anesthesia   . Diabetes mellitus without complication (HCC)   . Dyspnea   . Gout   . Hiatal hernia   . High cholesterol   . History of kidney stones   . Hypertension   . Hypoxia 08/2016  . Low back pain   . Osteoarthritis   . Osteopenia   . PONV (postoperative nausea and vomiting)   . Rheumatoid arthritis (HCC)     Family History  Problem Relation Age of Onset  . Arthritis Brother   . Arthritis Sister   . Arthritis Sister   . Arthritis Brother   . Arthritis Brother   . Arthritis Brother    Past Surgical History:  Procedure Laterality Date  . ABDOMINAL HYSTERECTOMY     Social History   Social History Narrative  . Not on file   Immunization History  Administered Date(s) Administered  . Fluad Quad(high Dose 65+) 12/01/2018, 11/19/2021  . Influenza, High Dose Seasonal PF 11/26/2015  . Influenza-Unspecified 12/07/2016  . PFIZER(Purple Top)SARS-COV-2 Vaccination 04/07/2019, 04/28/2019  . Pneumococcal-Unspecified 09/04/2015     Objective: Vital Signs: There were no vitals taken for this visit.   Physical Exam Vitals and nursing note reviewed.  Constitutional:      Appearance: She is well-developed.  HENT:     Head: Normocephalic and atraumatic.  Eyes:     Conjunctiva/sclera: Conjunctivae normal.  Cardiovascular:     Rate and Rhythm: Normal rate and regular rhythm.  Heart sounds: Normal heart sounds.  Pulmonary:     Effort: Pulmonary effort is normal.     Breath sounds: Normal breath sounds.  Abdominal:     General: Bowel sounds are normal.     Palpations: Abdomen is soft.  Musculoskeletal:     Cervical back: Normal range of motion.  Lymphadenopathy:     Cervical: No cervical adenopathy.  Skin:    General: Skin is warm and dry.     Capillary  Refill: Capillary refill takes less than 2 seconds.  Neurological:     Mental Status: She is alert and oriented to person, place, and time.  Psychiatric:        Behavior: Behavior normal.     Musculoskeletal Exam: ***  CDAI Exam: CDAI Score: -- Patient Global: --; Provider Global: -- Swollen: --; Tender: -- Joint Exam 04/14/2023   No joint exam has been documented for this visit   There is currently no information documented on the homunculus. Go to the Rheumatology activity and complete the homunculus joint exam.  Investigation: No additional findings.  Imaging: No results found.  Recent Labs: Lab Results  Component Value Date   WBC 8.3 12/30/2022   HGB 9.3 (L) 12/30/2022   PLT 238 12/30/2022   NA 138 12/30/2022   K 5.1 12/30/2022   CL 108 12/30/2022   CO2 22 12/30/2022   GLUCOSE 122 (H) 12/30/2022   BUN 31 (H) 12/30/2022   CREATININE 1.06 (H) 12/30/2022   BILITOT 0.3 12/30/2022   ALKPHOS 87 04/10/2022   AST 12 12/30/2022   ALT 7 12/30/2022   PROT 7.1 12/30/2022   ALBUMIN 4.0 04/10/2022   CALCIUM 9.3 12/30/2022   GFRAA 95 03/26/2020    Speciality Comments: Prior therapy: Plaquenil (d/c per opthamologist recommendation) and SSZ (diarrhea) PLQ Eye Exam: 08/27/2022 [facility] Follow up 6 months  Please scheudle patient's appts. every 3 month per patient request for lab compliance.    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  No orders of the defined types were placed in this encounter.   Face-to-face time spent with patient was *** minutes. Greater than 50% of time was spent in counseling and coordination of care.  Follow-Up Instructions: No follow-ups on file.   Gearldine Bienenstock, PA-C  Note - This record has been created using Dragon software.  Chart creation errors have been sought, but may not always  have been located. Such creation errors do not reflect on  the standard of medical care.

## 2023-04-07 NOTE — Progress Notes (Deleted)
 Office Visit Note  Patient: Mary [name]             Date of Birth: Apr 05, 1940           MRN: [id number]             PCP: Erskine Emery, NP Referring: Erskine Emery, NP Visit Date: 04/21/2023 Occupation: @GUAROCC @  Subjective:    History of Present Illness: Mary [name] is a 83 y.o. female with history of seronegative rheumatoid arthritis and gout.  Patient remains on Arava 20 mg 1 tablet by mouth daily and plaquenil 200 mg 1 tablet by mouth daily M-F.    CBC and CMP were drawn on 12/30/2022.  Orders for CBC and CMP were released today PLQ Eye Exam: 08/27/2022 WNL [facility]   Activities of Daily Living:  Patient reports morning stiffness for *** {minute/hour:19697}.   Patient {ACTIONS;DENIES/REPORTS:21021675::"Denies"} nocturnal pain.  Difficulty dressing/grooming: {ACTIONS;DENIES/REPORTS:21021675::"Denies"} Difficulty climbing stairs: {ACTIONS;DENIES/REPORTS:21021675::"Denies"} Difficulty getting out of chair: {ACTIONS;DENIES/REPORTS:21021675::"Denies"} Difficulty using hands for taps, buttons, cutlery, and/or writing: {ACTIONS;DENIES/REPORTS:21021675::"Denies"}  No Rheumatology ROS completed.   PMFS History:  Patient Active Problem List   Diagnosis Date Noted  . Wheelchair dependent 06/17/2022  . Mild persistent asthma without complication 11/18/2017  . Age-related osteoporosis without current pathological fracture 11/19/2016  . Acute respiratory failure (HCC)   . Rheumatoid lung (HCC)   . Hypoxia 09/03/2016  . Essential hypertension 09/03/2016  . Urinary tract infection 09/03/2016  . 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 High risk medication use 02/28/2016  . Idiopathic chronic gout of multiple sites without tophus 02/28/2016  . Primary osteoarthritis of both knees 02/28/2016  . Contracture, joint, multiple sites 02/28/2016  . History of hypothyroidism 02/28/2016  . History of diabetes mellitus  02/28/2016  . History of shingles 02/28/2016  . History of hyperlipidemia 02/28/2016    Past Medical History:  Diagnosis Date  . Anemia   . Antritis (stomach)   . Aortic aneurysm (HCC)   . Complication of anesthesia   . Diabetes mellitus without complication (HCC)   . Dyspnea   . Gout   . Hiatal hernia   . High cholesterol   . History of kidney stones   . Hypertension   . Hypoxia 08/2016  . Low back pain   . Osteoarthritis   . Osteopenia   . PONV (postoperative nausea and vomiting)   . Rheumatoid arthritis (HCC)     Family History  Problem Relation Age of Onset  . Arthritis Brother   . Arthritis Sister   . Arthritis Sister   . Arthritis Brother   . Arthritis Brother   . Arthritis Brother    Past Surgical History:  Procedure Laterality Date  . ABDOMINAL HYSTERECTOMY     Social History   Social History Narrative  . Not on file   Immunization History  Administered Date(s) Administered  . Fluad Quad(high Dose 65+) 12/01/2018, 11/19/2021  . Influenza, High Dose Seasonal PF 11/26/2015  . Influenza-Unspecified 12/07/2016  . PFIZER(Purple Top)SARS-COV-2 Vaccination 04/07/2019, 04/28/2019  . Pneumococcal-Unspecified 09/04/2015     Objective: Vital Signs: There were no vitals taken for this visit.   Physical Exam Vitals and nursing note reviewed.  Constitutional:      Appearance: She is well-developed.  HENT:     Head: Normocephalic and atraumatic.  Eyes:     Conjunctiva/sclera: Conjunctivae normal.  Cardiovascular:     Rate and Rhythm: Normal rate and regular rhythm.  Heart sounds: Normal heart sounds.  Pulmonary:     Effort: Pulmonary effort is normal.     Breath sounds: Normal breath sounds.  Abdominal:     General: Bowel sounds are normal.     Palpations: Abdomen is soft.  Musculoskeletal:     Cervical back: Normal range of motion.  Lymphadenopathy:     Cervical: No cervical adenopathy.  Skin:    General: Skin is warm and dry.     Capillary  Refill: Capillary refill takes less than 2 seconds.  Neurological:     Mental Status: She is alert and oriented to person, place, and time.  Psychiatric:        Behavior: Behavior normal.     Musculoskeletal Exam: ***  CDAI Exam: CDAI Score: -- Patient Global: --; Provider Global: -- Swollen: --; Tender: -- Joint Exam 04/21/2023   No joint exam has been documented for this visit   There is currently no information documented on the homunculus. Go to the Rheumatology activity and complete the homunculus joint exam.  Investigation: No additional findings.  Imaging: No results found.  Recent Labs: Lab Results  Component Value Date   WBC 8.3 12/30/2022   HGB 9.3 (L) 12/30/2022   PLT 238 12/30/2022   NA 138 12/30/2022   K 5.1 12/30/2022   CL 108 12/30/2022   CO2 22 12/30/2022   GLUCOSE 122 (H) 12/30/2022   BUN 31 (H) 12/30/2022   CREATININE 1.06 (H) 12/30/2022   BILITOT 0.3 12/30/2022   ALKPHOS 87 04/10/2022   AST 12 12/30/2022   ALT 7 12/30/2022   PROT 7.1 12/30/2022   ALBUMIN 4.0 04/10/2022   CALCIUM 9.3 12/30/2022   GFRAA 95 03/26/2020    Speciality Comments: Prior therapy: Plaquenil (d/c per opthamologist recommendation) and SSZ (diarrhea) PLQ Eye Exam: 08/27/2022 [facility] Follow up 6 months  Please scheudle patient's appts. every 3 month per patient request for lab compliance.    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  No orders of the defined types were placed in this encounter.   Face-to-face time spent with patient was *** minutes. Greater than 50% of time was spent in counseling and coordination of care.  Follow-Up Instructions: No follow-ups on file.   Gearldine Bienenstock, PA-C  Note - This record has been created using Dragon software.  Chart creation errors have been sought, but may not always  have been located. Such creation errors do not reflect on  the standard of medical care.

## 2023-04-14 ENCOUNTER — Ambulatory Visit: Payer: Medicare HMO | Admitting: Physician Assistant

## 2023-04-14 DIAGNOSIS — M2450 Contracture, unspecified joint: Secondary | ICD-10-CM

## 2023-04-14 DIAGNOSIS — J849 Interstitial pulmonary disease, unspecified: Secondary | ICD-10-CM

## 2023-04-14 DIAGNOSIS — Z8619 Personal history of other infectious and parasitic diseases: Secondary | ICD-10-CM

## 2023-04-14 DIAGNOSIS — Z87442 Personal history of urinary calculi: Secondary | ICD-10-CM

## 2023-04-14 DIAGNOSIS — Z862 Personal history of diseases of the blood and blood-forming organs and certain disorders involving the immune mechanism: Secondary | ICD-10-CM

## 2023-04-14 DIAGNOSIS — Z8639 Personal history of other endocrine, nutritional and metabolic disease: Secondary | ICD-10-CM

## 2023-04-14 DIAGNOSIS — Z96652 Presence of left artificial knee joint: Secondary | ICD-10-CM

## 2023-04-14 DIAGNOSIS — M810 Age-related osteoporosis without current pathological fracture: Secondary | ICD-10-CM

## 2023-04-14 DIAGNOSIS — Z79899 Other long term (current) drug therapy: Secondary | ICD-10-CM

## 2023-04-14 DIAGNOSIS — M1711 Unilateral primary osteoarthritis, right knee: Secondary | ICD-10-CM

## 2023-04-14 DIAGNOSIS — M0609 Rheumatoid arthritis without rheumatoid factor, multiple sites: Secondary | ICD-10-CM

## 2023-04-14 DIAGNOSIS — M1A09X0 Idiopathic chronic gout, multiple sites, without tophus (tophi): Secondary | ICD-10-CM

## 2023-04-14 DIAGNOSIS — I10 Essential (primary) hypertension: Secondary | ICD-10-CM

## 2023-04-20 NOTE — Progress Notes (Signed)
 Office Visit Note  Patient: Mary [name]             Date of Birth: 1940-10-14           MRN: [id number]             PCP: Erskine Emery, NP Referring: Erskine Emery, NP Visit Date: 05/04/2023 Occupation: @GUAROCC @  Subjective:  Chronic right hip and knee pain   History of Present Illness: Mary [name] is a 83 y.o. female with history of seronegative rheumatoid arthritis, ILD, osteoporosis, and gout.  Patient remains on Arava 20 mg 1 tablet by mouth daily and plaquenil 200 mg 1 tablet by mouth daily M-F.  She is tolerating combination therapy without any side effects.  She has not had any recent gaps in therapy.  Patient reports for the past 3 days she has been experiencing increased arthralgias which she attributes to weather change.  Her pain has been as severe in the right hip and right knee.  She uses Voltaren gel and Biofreeze topically for pain relief.  She takes Tylenol as needed.  She is remaining primarily wheelchair-bound at home.  She denies any new or worsening pulmonary symptoms.  She denies any new medical conditions.  She has not any recent or recurrent infections. She is taking allopurinol 200 mg daily for management of gout.  She denies any signs or symptoms of a gout flare.   Activities of Daily Living:  Patient reports morning stiffness for 30 minutes.   Patient Reports nocturnal pain.  Difficulty dressing/grooming: Reports Difficulty climbing stairs: Reports Difficulty getting out of chair: Reports Difficulty using hands for taps, buttons, cutlery, and/or writing: Reports  Review of Systems  Constitutional:  Negative for fatigue.  HENT:  Negative for mouth sores, mouth dryness and nose dryness.   Eyes:  Positive for photophobia and pain. Negative for dryness.  Respiratory:  Positive for cough and wheezing. Negative for difficulty breathing.   Cardiovascular:  Negative for chest pain and palpitations.  Gastrointestinal:  Negative for blood in stool,  constipation and diarrhea.  Endocrine: Negative for increased urination.  Genitourinary:  Negative for involuntary urination.  Musculoskeletal:  Positive for joint pain, joint pain and morning stiffness. Negative for joint swelling, myalgias, muscle weakness, muscle tenderness and myalgias.  Skin:  Positive for hair loss. Negative for color change, rash and sensitivity to sunlight.  Allergic/Immunologic: Negative for susceptible to infections.  Neurological:  Negative for dizziness and headaches.  Hematological:  Negative for swollen glands.  Psychiatric/Behavioral:  Positive for sleep disturbance. Negative for depressed mood. The patient is not nervous/anxious.     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 factor (HCC) 02/28/2016   High risk medication use 02/28/2016   Idiopathic chronic gout of multiple sites without tophus 02/28/2016   Primary osteoarthritis of both knees 02/28/2016   Contracture, joint, multiple sites 02/28/2016   History of hypothyroidism 02/28/2016   History of diabetes mellitus 02/28/2016   History of shingles 02/28/2016   History of hyperlipidemia 02/28/2016    Past Medical History:  Diagnosis Date   Anemia    Antritis (stomach)  Aortic aneurysm (HCC)    Complication of anesthesia    Diabetes mellitus without complication (HCC)    Dyspnea    Gout    Hiatal hernia    High cholesterol    History of kidney stones    Hypertension    Hypoxia 08/2016   Low back pain    Osteoarthritis    Osteopenia    PONV (postoperative nausea and vomiting)     Rheumatoid arthritis (HCC)     Family History  Problem Relation Age of Onset   Arthritis Brother    Arthritis Sister    Arthritis Sister    Arthritis Brother    Arthritis Brother    Arthritis Brother    Past Surgical History:  Procedure Laterality Date   ABDOMINAL HYSTERECTOMY     Social History   Social History Narrative   Not on file   Immunization History  Administered Date(s) Administered   Fluad Quad(high Dose 65+) 12/01/2018, 11/19/2021   Influenza, High Dose Seasonal PF 11/26/2015   Influenza-Unspecified 12/07/2016   PFIZER(Purple Top)SARS-COV-2 Vaccination 04/07/2019, 04/28/2019   Pneumococcal-Unspecified 09/04/2015     Objective: Vital Signs: BP 123/80 (BP Location: Left Arm, Patient Position: Sitting, Cuff Size: Normal)   Pulse 92   Resp 14   Ht 5' (1.524 m)   BMI 29.29 kg/m    Physical Exam Vitals and nursing note reviewed.  Constitutional:      Appearance: She is well-developed.  HENT:     Head: Normocephalic and atraumatic.  Eyes:     Conjunctiva/sclera: Conjunctivae normal.  Cardiovascular:     Rate and Rhythm: Normal rate and regular rhythm.     Heart sounds: Normal heart sounds.  Pulmonary:     Effort: Pulmonary effort is normal.     Breath sounds: Normal breath sounds.     Comments: Crackles at bilateral lung bases Abdominal:     General: Bowel sounds are normal.     Palpations: Abdomen is soft.  Musculoskeletal:     Cervical back: Normal range of motion.  Lymphadenopathy:     Cervical: No cervical adenopathy.  Skin:    General: Skin is warm and dry.     Capillary Refill: Capillary refill takes less than 2 seconds.  Neurological:     Mental Status: She is alert and oriented to person, place, and time.  Psychiatric:        Behavior: Behavior normal.      Musculoskeletal Exam: Patient remained in the wheelchair during the examination today.  C-spine has limited range of motion.  Thoracic kyphosis noted.  Limited range of motion of  both shoulder joints.  Flexion contractures of both elbows.  Severely limited range of motion of both wrist joints.  Synovial thickening of MCP joints with ulnar deviation.  Incomplete fist formation.  Contractures of MCP joints and PIP joints.  Limited extension of both knees.  Flexion contracture of the right knee.  Subluxation and thickening of both ankles.  CDAI Exam: CDAI Score: -- Patient Global: --; Provider Global: -- Swollen: --; Tender: -- Joint Exam 05/04/2023   No joint exam has been documented for this visit   There is currently no information documented on the homunculus. Go to the Rheumatology activity and complete the homunculus joint exam.  Investigation: No additional findings.  Imaging: No results found.  Recent Labs: Lab Results  Component Value Date   WBC 8.3 12/30/2022   HGB 9.3 (L) 12/30/2022   PLT 238 12/30/2022   NA 138 12/30/2022  K 5.1 12/30/2022   CL 108 12/30/2022   CO2 22 12/30/2022   GLUCOSE 122 (H) 12/30/2022   BUN 31 (H) 12/30/2022   CREATININE 1.06 (H) 12/30/2022   BILITOT 0.3 12/30/2022   ALKPHOS 87 04/10/2022   AST 12 12/30/2022   ALT 7 12/30/2022   PROT 7.1 12/30/2022   ALBUMIN 4.0 04/10/2022   CALCIUM 9.3 12/30/2022   GFRAA 95 03/26/2020    Speciality Comments: Prior therapy: Plaquenil (d/c per opthamologist recommendation) and SSZ (diarrhea) PLQ Eye Exam: 08/27/2022 [facility] Follow up 6 months  Please scheudle patient's appts. every 3 month per patient request for lab compliance.    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  She is wheelchair-bound: Has been experiencing increased arthralgias particularly in her right hip and right knee joint for the past 3 days which she attributes to change in  weather.  She has not noticed any increased joint swelling.  Overall her rheumatoid arthritis has remained stable taking Arava 20 mg 1 tablet by mouth daily and Plaquenil 200 mg 1 tablet by mouth Monday through Friday.  She is tolerating combination therapy without any side effects and has not had any recent gaps in therapy.  No medication changes will be made at this time.  She plans on continue to take Tylenol as needed for pain relief.  She will also use Voltaren gel and Biofreeze topically as needed.  She was advised to notify us if she develops signs or symptoms of a flare.  She will follow-up in the office in 3 months or sooner if needed.   ILD (interstitial lung disease) (HCC) - Presumed to be RA ILD: Patient was evaluated by Dr. Isaiah Serge on November 19, 2021.  Unable to obtain PFTs in the past.  No new or worsening pulmonary symptoms.  Crackles at bilateral lung bases.   High risk medication use - Arava 20 mg 1 tablet by mouth daily and plaquenil 200 mg 1 tablet by mouth daily M-F.  CBC and CMP were drawn on 12/30/2022.  Orders for CBC and CMP were released today PLQ Eye Exam: 08/27/2022 [facility].  According to the patient she had an updated Plaquenil eye examination in January 2025-we will call to obtain these records. No recent or recurrent infections.  Discussed the importance of holding Arava if she develops signs or symptoms of an infection and to resume once the infection has completely cleared. - Plan: COMPLETE METABOLIC PANEL WITH GFR, CBC with Differential/Platelet  Idiopathic chronic gout of multiple sites without tophus - She has not had any signs or symptoms of a gout flare.  She has clinically been doing well taking allopurinol 200 mg daily for management of gout. Uric acid: 4.9 on 04/10/2022.  Uric acid level be rechecked today.- Plan: Uric acid  Primary osteoarthritis of right knee: Chronic pain.  Flexion contracture of right knee. Patient has been using Voltaren gel  and applying Biofreeze topically as needed for pain relief.  She takes Tylenol as needed.  S/P TKR (total knee replacement), left: Chronic pain.  Slightly limited extension.  No effusion noted.  Age-related osteoporosis without current pathological fracture - DEXA on 06/18/2020 Right femoral neck BMD 0.593 with T score -2.3.  Patient has declined to schedule updated bone density at this time.  Flexion contractures - End stage rheumatoid arthritis. Flexion contracture ~10 degrees in both elbows.  Flexion contracture of both knees.  Patient is primarily wheelchair-bound.  Other medical conditions are listed as follows:  History of diabetes mellitus  History of kidney stones  History of hyperlipidemia  Essential hypertension: Blood pressure is 123/80 today in the office.  History of hypothyroidism  History of anemia  History of shingles  Orders: Orders Placed This Encounter  Procedures   COMPLETE METABOLIC PANEL WITH GFR   CBC with Differential/Platelet   Uric acid   No orders of the defined types were placed in this encounter.     Follow-Up Instructions: Return in about 3 months (around 08/04/2023) for Rheumatoid arthritis, ILD, Osteoporosis, Gout.   Gearldine Bienenstock, PA-C  Note - This record has been created using Dragon software.  Chart creation errors have been sought, but may not always  have been located. Such creation errors do not reflect on  the standard of medical care.

## 2023-04-21 ENCOUNTER — Ambulatory Visit: Payer: Medicare HMO | Admitting: Physician Assistant

## 2023-04-21 DIAGNOSIS — Z862 Personal history of diseases of the blood and blood-forming organs and certain disorders involving the immune mechanism: Secondary | ICD-10-CM

## 2023-04-21 DIAGNOSIS — I10 Essential (primary) hypertension: Secondary | ICD-10-CM

## 2023-04-21 DIAGNOSIS — J849 Interstitial pulmonary disease, unspecified: Secondary | ICD-10-CM

## 2023-04-21 DIAGNOSIS — M810 Age-related osteoporosis without current pathological fracture: Secondary | ICD-10-CM

## 2023-04-21 DIAGNOSIS — Z8639 Personal history of other endocrine, nutritional and metabolic disease: Secondary | ICD-10-CM

## 2023-04-21 DIAGNOSIS — M0609 Rheumatoid arthritis without rheumatoid factor, multiple sites: Secondary | ICD-10-CM

## 2023-04-21 DIAGNOSIS — Z8619 Personal history of other infectious and parasitic diseases: Secondary | ICD-10-CM

## 2023-04-21 DIAGNOSIS — Z96652 Presence of left artificial knee joint: Secondary | ICD-10-CM

## 2023-04-21 DIAGNOSIS — Z79899 Other long term (current) drug therapy: Secondary | ICD-10-CM

## 2023-04-21 DIAGNOSIS — M2450 Contracture, unspecified joint: Secondary | ICD-10-CM

## 2023-04-21 DIAGNOSIS — Z87442 Personal history of urinary calculi: Secondary | ICD-10-CM

## 2023-04-21 DIAGNOSIS — M1A09X0 Idiopathic chronic gout, multiple sites, without tophus (tophi): Secondary | ICD-10-CM

## 2023-04-21 DIAGNOSIS — M1711 Unilateral primary osteoarthritis, right knee: Secondary | ICD-10-CM

## 2023-05-04 ENCOUNTER — Encounter: Payer: Self-pay | Admitting: Physician Assistant

## 2023-05-04 ENCOUNTER — Ambulatory Visit: Payer: Medicare HMO | Attending: Physician Assistant | Admitting: Physician Assistant

## 2023-05-04 VITALS — BP 123/80 | HR 92 | Resp 14 | Ht 60.0 in

## 2023-05-04 DIAGNOSIS — M810 Age-related osteoporosis without current pathological fracture: Secondary | ICD-10-CM

## 2023-05-04 DIAGNOSIS — Z8639 Personal history of other endocrine, nutritional and metabolic disease: Secondary | ICD-10-CM

## 2023-05-04 DIAGNOSIS — Z79899 Other long term (current) drug therapy: Secondary | ICD-10-CM

## 2023-05-04 DIAGNOSIS — Z87442 Personal history of urinary calculi: Secondary | ICD-10-CM

## 2023-05-04 DIAGNOSIS — M2450 Contracture, unspecified joint: Secondary | ICD-10-CM

## 2023-05-04 DIAGNOSIS — M1A09X0 Idiopathic chronic gout, multiple sites, without tophus (tophi): Secondary | ICD-10-CM | POA: Diagnosis not present

## 2023-05-04 DIAGNOSIS — Z96652 Presence of left artificial knee joint: Secondary | ICD-10-CM

## 2023-05-04 DIAGNOSIS — M0609 Rheumatoid arthritis without rheumatoid factor, multiple sites: Secondary | ICD-10-CM | POA: Diagnosis not present

## 2023-05-04 DIAGNOSIS — J849 Interstitial pulmonary disease, unspecified: Secondary | ICD-10-CM | POA: Diagnosis not present

## 2023-05-04 DIAGNOSIS — Z862 Personal history of diseases of the blood and blood-forming organs and certain disorders involving the immune mechanism: Secondary | ICD-10-CM

## 2023-05-04 DIAGNOSIS — I10 Essential (primary) hypertension: Secondary | ICD-10-CM

## 2023-05-04 DIAGNOSIS — M1711 Unilateral primary osteoarthritis, right knee: Secondary | ICD-10-CM

## 2023-05-04 DIAGNOSIS — Z8619 Personal history of other infectious and parasitic diseases: Secondary | ICD-10-CM

## 2023-05-05 LAB — COMPLETE METABOLIC PANEL WITH GFR
AG Ratio: 1.3 (calc) (ref 1.0–2.5)
ALT: 6 U/L (ref 6–29)
AST: 9 U/L — ABNORMAL LOW (ref 10–35)
Albumin: 3.9 g/dL (ref 3.6–5.1)
Alkaline phosphatase (APISO): 86 U/L (ref 37–153)
BUN/Creatinine Ratio: 28 (calc) — ABNORMAL HIGH (ref 6–22)
BUN: 28 mg/dL — ABNORMAL HIGH (ref 7–25)
CO2: 19 mmol/L — ABNORMAL LOW (ref 20–32)
Calcium: 9.3 mg/dL (ref 8.6–10.4)
Chloride: 105 mmol/L (ref 98–110)
Creat: 1 mg/dL — ABNORMAL HIGH (ref 0.60–0.95)
Globulin: 3 g/dL (ref 1.9–3.7)
Glucose, Bld: 179 mg/dL — ABNORMAL HIGH (ref 65–99)
Potassium: 4.2 mmol/L (ref 3.5–5.3)
Sodium: 138 mmol/L (ref 135–146)
Total Bilirubin: 0.2 mg/dL (ref 0.2–1.2)
Total Protein: 6.9 g/dL (ref 6.1–8.1)
eGFR: 57 mL/min/{1.73_m2} — ABNORMAL LOW (ref >=60)

## 2023-05-05 LAB — CBC WITH DIFFERENTIAL/PLATELET
Absolute Lymphocytes: 1436 {cells}/uL (ref 850–3900)
Absolute Monocytes: 571 {cells}/uL (ref 200–950)
Basophils Absolute: 92 {cells}/uL (ref 0–200)
Basophils Relative: 1.1 %
Eosinophils Absolute: 470 {cells}/uL (ref 15–500)
Eosinophils Relative: 5.6 %
HCT: 28.6 % — ABNORMAL LOW (ref 35.0–45.0)
Hemoglobin: 8.8 g/dL — ABNORMAL LOW (ref 11.7–15.5)
MCH: 28.2 pg (ref 27.0–33.0)
MCHC: 30.8 g/dL — ABNORMAL LOW (ref 32.0–36.0)
MCV: 91.7 fL (ref 80.0–100.0)
MPV: 13 fL — ABNORMAL HIGH (ref 7.5–12.5)
Monocytes Relative: 6.8 %
Neutro Abs: 5830 {cells}/uL (ref 1500–7800)
Neutrophils Relative %: 69.4 %
Platelets: 275 10*3/uL (ref 140–400)
RBC: 3.12 10*6/uL — ABNORMAL LOW (ref 3.80–5.10)
RDW: 16.1 % — ABNORMAL HIGH (ref 11.0–15.0)
Total Lymphocyte: 17.1 %
WBC: 8.4 10*3/uL (ref 3.8–10.8)

## 2023-05-05 LAB — URIC ACID: Uric Acid, Serum: 4.7 mg/dL (ref 2.5–7.0)

## 2023-05-05 NOTE — Progress Notes (Signed)
 Uric acid WNL Glucose is 179.  Creatinine remains borderline elevated but has improved/trending down-1.00 and GFR 57. Avoid the use of NSAIDs.   RBC count, hgb, and hct remain low and continue to trend down--hemoglobin 8.8.  please notify the patient and forward results to PCP.   Please clarify if she has noticed any blood in her stool?

## 2023-05-19 ENCOUNTER — Ambulatory Visit: Payer: Medicare HMO | Admitting: Physician Assistant

## 2023-05-19 ENCOUNTER — Other Ambulatory Visit (INDEPENDENT_AMBULATORY_CARE_PROVIDER_SITE_OTHER)

## 2023-05-19 ENCOUNTER — Encounter: Payer: Self-pay | Admitting: Physician Assistant

## 2023-05-19 VITALS — BP 130/68 | HR 93 | Ht 65.0 in | Wt 150.0 lb

## 2023-05-19 DIAGNOSIS — K449 Diaphragmatic hernia without obstruction or gangrene: Secondary | ICD-10-CM | POA: Diagnosis not present

## 2023-05-19 DIAGNOSIS — E538 Deficiency of other specified B group vitamins: Secondary | ICD-10-CM

## 2023-05-19 DIAGNOSIS — K5900 Constipation, unspecified: Secondary | ICD-10-CM | POA: Diagnosis not present

## 2023-05-19 DIAGNOSIS — D649 Anemia, unspecified: Secondary | ICD-10-CM | POA: Diagnosis not present

## 2023-05-19 DIAGNOSIS — K219 Gastro-esophageal reflux disease without esophagitis: Secondary | ICD-10-CM | POA: Diagnosis not present

## 2023-05-19 DIAGNOSIS — M0609 Rheumatoid arthritis without rheumatoid factor, multiple sites: Secondary | ICD-10-CM

## 2023-05-19 DIAGNOSIS — R197 Diarrhea, unspecified: Secondary | ICD-10-CM

## 2023-05-19 DIAGNOSIS — M0510 Rheumatoid lung disease with rheumatoid arthritis of unspecified site: Secondary | ICD-10-CM

## 2023-05-19 DIAGNOSIS — R6881 Early satiety: Secondary | ICD-10-CM

## 2023-05-19 DIAGNOSIS — K5904 Chronic idiopathic constipation: Secondary | ICD-10-CM

## 2023-05-19 LAB — COMPREHENSIVE METABOLIC PANEL
ALT: 5 U/L (ref 0–35)
AST: 9 U/L (ref 0–37)
Albumin: 4 g/dL (ref 3.5–5.2)
Alkaline Phosphatase: 87 U/L (ref 39–117)
BUN: 25 mg/dL — ABNORMAL HIGH (ref 6–23)
CO2: 23 meq/L (ref 19–32)
Calcium: 9.5 mg/dL (ref 8.4–10.5)
Chloride: 108 meq/L (ref 96–112)
Creatinine, Ser: 0.83 mg/dL (ref 0.40–1.20)
GFR: 66.28 mL/min (ref >60.00)
Glucose, Bld: 159 mg/dL — ABNORMAL HIGH (ref 70–99)
Potassium: 4 meq/L (ref 3.5–5.1)
Sodium: 139 meq/L (ref 135–145)
Total Bilirubin: 0.3 mg/dL (ref 0.2–1.2)
Total Protein: 8 g/dL (ref 6.0–8.3)

## 2023-05-19 LAB — CBC WITH DIFFERENTIAL/PLATELET
Basophils Absolute: 0.2 10*3/uL — ABNORMAL HIGH (ref 0.0–0.1)
Basophils Relative: 2 % (ref 0.0–3.0)
Eosinophils Absolute: 0.6 10*3/uL (ref 0.0–0.7)
Eosinophils Relative: 7.4 % — ABNORMAL HIGH (ref 0.0–5.0)
HCT: 29.7 % — ABNORMAL LOW (ref 36.0–46.0)
Hemoglobin: 9.5 g/dL — ABNORMAL LOW (ref 12.0–15.0)
Lymphocytes Relative: 15.7 % (ref 12.0–46.0)
Lymphs Abs: 1.2 10*3/uL (ref 0.7–4.0)
MCHC: 31.9 g/dL (ref 30.0–36.0)
MCV: 92.2 fl (ref 78.0–100.0)
Monocytes Absolute: 0.6 10*3/uL (ref 0.1–1.0)
Monocytes Relative: 7.7 % (ref 3.0–12.0)
Neutro Abs: 5.2 10*3/uL (ref 1.4–7.7)
Neutrophils Relative %: 67.2 % (ref 43.0–77.0)
Platelets: 307 10*3/uL (ref 150.0–400.0)
RBC: 3.22 Mil/uL — ABNORMAL LOW (ref 3.87–5.11)
RDW: 18.6 % — ABNORMAL HIGH (ref 11.5–15.5)
WBC: 7.8 10*3/uL (ref 4.0–10.5)

## 2023-05-19 LAB — IBC + FERRITIN
Ferritin: 199.8 ng/mL (ref 10.0–291.0)
Iron: 40 ug/dL — ABNORMAL LOW (ref 42–145)
Saturation Ratios: 14.6 % — ABNORMAL LOW (ref 20.0–50.0)
TIBC: 274.4 ug/dL (ref 250.0–450.0)
Transferrin: 196 mg/dL — ABNORMAL LOW (ref 212.0–360.0)

## 2023-05-19 LAB — FOLATE: Folate: >25.2 ng/mL (ref >5.9)

## 2023-05-19 LAB — VITAMIN B12: Vitamin B-12: 286 pg/mL (ref 211–911)

## 2023-05-19 LAB — RETICULOCYTES
ABS Retic: 53890 {cells}/uL (ref 20000–80000)
Retic Ct Pct: 1.7 %

## 2023-05-19 NOTE — Progress Notes (Signed)
 05/19/2023 Mary [name] [id number] May 26, 1940  Referring provider: Erskine Emery, NP Primary GI doctor: Dr. Adela Lank  ASSESSMENT AND PLAN:   Normocytic Anemia without overt bleeding  05/04/2023  HGB 8.8 (9.3)  MCV 91.7 Platelets 275 12/30/2022 Iron 50 Ferritin 184  Has been on and off iron, was restarted 3 weeks ago  Recent Labs    06/24/22 1347 09/24/22 1410 12/30/22 1348 05/04/23 1503  HGB 10.2* 9.0* 9.3* 8.8*   - I also have not seen an iron def from labs - will get iron, ferriitn, B12, folate, retic count - pending results we may stop her iron supplement and see where she is at - declines rectal and declines hemoccult cards High risk for endoscopic evaluation due to rheumatoid arthritis with lung involvement and wheelchair dependency, also after candid discussion with patient and daughter, she is not interested in endoscopic evaluation even if this means a cancer is undetected. -Patient to contact office if overt GI bleeding occurs for possible in-hospital endoscopy or colonoscopy. - follow up 3 months  Hiatal Hernia Moderate size, identified on CT chest in March. Mild reflux and early satiety. Diarrhea with pantroprazole - did well with omeprazole 40 mg daily, continue the same - can take pepcid twice a day -states she is feeling better, eating more  Constipation Likely secondary to decreased movement (wheelchair dependent) and medications. - has increased eating and states she is doing better  Follow-up in 3-4 months.   Patient Care Team: Erskine Emery, NP as PCP - General  HISTORY OF PRESENT ILLNESS: 83 y.o. 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    05/19/2022 CT chest high-resolution with history of interstitial lung disease showed atherosclerosis of  coronary arteries, moderate size hiatal hernia widespread patchy groundglass attenuation, septal thickening, subpleural reticulation, bronchiectasis scattered areas of honeycombing nonobstructing left kidney stone 1.8 cm right liver cyst likely benign no imaging recommended 09/24/2022 hemoglobin 9 compared to 10.2 three months prior, baseline appears to be around 10, MCV normocytic at 92 normal platelets no leukocytosis.  BUN 35, creatinine 1.12, liver function unremarkable  Discussed the use of AI scribe software for clinical note transcription with the patient, who gave verbal consent to proceed.  Patient in wheel chair dependent due to bilateral knees. She had a colonoscopy normal with Doctor in [place] 20 years ago, has not had another since that time. No family history of colon cancer.  She is not on oxygen.   She states she has always been anemic, had the colonoscopy years and years ago due to anemia.  She states that Dr. Titus Dubin has told her that her anemia is related to her RA, she is now on iron once daily for the last month. Started by [employer].  She states occ with discomfort she does not want to eat. She states she can have nausea with certain foods like [place] only, no vomiting. Denies GERD. She does have metformin get "hung on right throat" she can drink water and it helps, not consistent. Otherwise denies dysphagia. She does get full quickly, has been worse the last several months.  Denies melena or hematochezia.  She has BM every other day.   She  reports that she has never smoked. She has been exposed to tobacco smoke. She has never used smokeless tobacco. She reports that she does not drink alcohol and  does not use drugs.  RELEVANT LABS AND IMAGING:  Results          CBC    Component Value Date/Time   WBC 8.4 05/04/2023 1503   RBC 3.12 (L) 05/04/2023 1503   HGB 8.8 (L) 05/04/2023 1503   HGB 9.8 (L) 04/10/2022 1148   HCT 28.6 (L) 05/04/2023 1503   HCT 31.2 (L)  04/10/2022 1148   PLT 275 05/04/2023 1503   PLT 191 04/10/2022 1148   MCV 91.7 05/04/2023 1503   MCV 89 04/10/2022 1148   MCH 28.2 05/04/2023 1503   MCHC 30.8 (L) 05/04/2023 1503   RDW 16.1 (H) 05/04/2023 1503   RDW 16.1 (H) 04/10/2022 1148   LYMPHSABS 1,529 09/24/2022 1410   LYMPHSABS 1.0 04/10/2022 1148   MONOABS 0.7 08/17/2017 1150   EOSABS 470 05/04/2023 1503   EOSABS 0.4 04/10/2022 1148   BASOSABS 92 05/04/2023 1503   BASOSABS 0.1 04/10/2022 1148   Recent Labs    06/24/22 1347 09/24/22 1410 12/30/22 1348 05/04/23 1503  HGB 10.2* 9.0* 9.3* 8.8*    CMP     Component Value Date/Time   NA 138 05/04/2023 1503   NA 138 04/10/2022 1148   K 4.2 05/04/2023 1503   CL 105 05/04/2023 1503   CO2 19 (L) 05/04/2023 1503   GLUCOSE 179 (H) 05/04/2023 1503   BUN 28 (H) 05/04/2023 1503   BUN 24 04/10/2022 1148   CREATININE 1.00 (H) 05/04/2023 1503   CALCIUM 9.3 05/04/2023 1503   PROT 6.9 05/04/2023 1503   PROT 6.7 04/10/2022 1148   ALBUMIN 4.0 04/10/2022 1148   AST 9 (L) 05/04/2023 1503   ALT 6 05/04/2023 1503   ALKPHOS 87 04/10/2022 1148   BILITOT 0.2 05/04/2023 1503   BILITOT <0.2 04/10/2022 1148   GFRNONAA 82 03/26/2020 0851   GFRNONAA 84 07/04/2019 1209   GFRAA 95 03/26/2020 0851   GFRAA 97 07/04/2019 1209      Latest Ref Rng & Units 05/04/2023    3:03 PM 12/30/2022    1:48 PM 09/24/2022    2:10 PM  Hepatic Function  Total Protein 6.1 - 8.1 g/dL 6.9  7.1  7.0   AST 10 - 35 U/L 9  12  11    ALT 6 - 29 U/L 6  7  5    Total Bilirubin 0.2 - 1.2 mg/dL 0.2  0.3  0.3       Current Medications:   Current Outpatient Medications (Endocrine & Metabolic):    levothyroxine (SYNTHROID) 100 MCG tablet, Take 100 mcg by mouth daily.   levothyroxine (SYNTHROID, LEVOTHROID) 75 MCG tablet, Take 75 mcg by mouth daily.  (Patient not taking: Reported on 05/04/2023)   metFORMIN (GLUCOPHAGE-XR) 500 MG 24 hr tablet, Take 500 mg by mouth daily.  Current Outpatient Medications  (Cardiovascular):    amLODipine (NORVASC) 2.5 MG tablet, Take 2.5 mg by mouth daily.   atorvastatin (LIPITOR) 20 MG tablet, SMARTSIG:1 Tablet(s) By Mouth Every Evening   benazepril (LOTENSIN) 20 MG tablet, Take 1 tablet (20 mg total) by mouth daily.   carvedilol (COREG) 6.25 MG tablet,   Current Outpatient Medications (Respiratory):    albuterol (PROVENTIL) (2.5 MG/3ML) 0.083% nebulizer solution, Take 3 mLs (2.5 mg total) by nebulization every 6 (six) hours as needed for wheezing or shortness of breath.   ipratropium-albuterol (DUONEB) 0.5-2.5 (3) MG/3ML SOLN, Take 3 mLs by nebulization 4 (four) times daily as needed.  Current Outpatient Medications (Analgesics):    acetaminophen (TYLENOL) 500 MG  tablet, as needed.    allopurinol (ZYLOPRIM) 100 MG tablet, Take 200 mg by mouth daily.    leflunomide (ARAVA) 20 MG tablet, TAKE ONE TABLET BY MOUTH EVERY DAY  Current Outpatient Medications (Hematological):    FEROSUL 325 (65 Fe) MG tablet, Take 325 mg by mouth daily.   folic acid (FOLVITE) 1 MG tablet, Take 2 mg by mouth daily.  Current Outpatient Medications (Other):    Cholecalciferol (VITAMIN D3) 5000 units TABS, Take 1 tablet by mouth. Takes on Monday, Wednesday, and Friday.   clorazepate (TRANXENE) 7.5 MG tablet, Take 7.5 mg by mouth 2 (two) times daily.    famotidine (PEPCID) 20 MG tablet, Take 20 mg by mouth 2 (two) times daily. (Patient not taking: Reported on 05/19/2023)   hydroxychloroquine (PLAQUENIL) 200 MG tablet, Take 1 tablet by mouth daily Monday-Fridays only.   lidocaine (XYLOCAINE) 5 % ointment, Apply 1 Application topically 4 (four) times daily as needed for mild pain. Can put on a layer like icing a cake and then saran wrap it to stay on- (Patient not taking: Reported on 12/30/2022)   magnesium oxide (MAG-OX) 400 MG tablet, Take 1 tablet by mouth daily.   pantoprazole (PROTONIX) 40 MG tablet, Take 1 tablet (40 mg total) by mouth 2 (two) times daily.   Polyethylene Glycol 3350  (MIRALAX PO), Take by mouth. (Patient not taking: Reported on 05/04/2023)  Medical History:  Past Medical History:  Diagnosis Date   Anemia    Antritis (stomach)    Aortic aneurysm (HCC)    Complication of anesthesia    Diabetes mellitus without complication (HCC)    Dyspnea    Gout    Hiatal hernia    High cholesterol    History of kidney stones    Hypertension    Hypoxia 08/2016   Low back pain    Osteoarthritis    Osteopenia    PONV (postoperative nausea and vomiting)    Rheumatoid arthritis (HCC)    Allergies:  Allergies  Allergen Reactions   Aspirin Other (See Comments)    Itching per pt   Codeine Nausea Only     Surgical History:  She  has a past surgical history that includes Abdominal hysterectomy. Family History:  Her family history includes Arthritis in her brother, brother, brother, brother, sister, and sister.  REVIEW OF SYSTEMS  : All other systems reviewed and negative except where noted in the History of Present Illness.  PHYSICAL EXAM: BP 130/68   Pulse 93   Ht 5\' 5"  (1.651 m)   Wt 150 lb (68 kg)   BMI 24.96 kg/m  General Appearance: chronically ill appearing female, NAD Head:   Normocephalic and atraumatic. Eyes:  sclerae anicteric,conjunctive pink  Respiratory: Respiratory effort normal, decreased diffuse breath sounds with rhonchi Cardio: RRR with no MRGs. Peripheral pulses intact.  Abdomen: Soft,  Obese ,active bowel sounds. No tenderness . No masses. Rectal: declines Musculoskeletal: Full ROM, Not tested gait. With edema. Patient with mild contraction bilateral hands, with ulnar deviation, swelling Skin:  Dry and intact without significant lesions or rashes Neuro: Alert and  oriented x4;  No focal deficits. Psych:  Cooperative. Normal mood and affect.    Doree Albee, PA-C 2:56 PM

## 2023-05-19 NOTE — Progress Notes (Signed)
 Agree with assessment and plan as outlined.

## 2023-05-19 NOTE — Patient Instructions (Addendum)
 Your provider has requested that you go to the basement level for lab work before leaving today. Press "B" on the elevator. The lab is located at the first door on the left as you exit the elevator.  Please take your proton pump inhibitor medication, omeprazole 40 mg in the morning Continue pepcid/famotidine twice a day Please take this medication 30 minutes to 1 hour before meals- this makes it more effective.  Avoid spicy and acidic foods Avoid fatty foods Limit your intake of coffee, tea, alcohol, and carbonated drinks Work to maintain a healthy weight Keep the head of the bed elevated at least 3 inches with blocks or a wedge pillow if you are having any nighttime symptoms Stay upright for 2 hours after eating Avoid meals and snacks three to four hours before bedtime   Go to the ER if you have coffee ground vomiting, unable to keep down food or drink, black tarry stools, dizziness, severe pain, shortness of breath or chest pain.   _______________________________________________________  If your blood pressure at your visit was 140/90 or greater, please contact your primary care physician to follow up on this.  _______________________________________________________  If you are age 86 or older, your body mass index should be between 23-30. Your Body mass index is 24.96 kg/m. If this is out of the aforementioned range listed, please consider follow up with your Primary Care Provider.  If you are age 83 or younger, your body mass index should be between 19-25. Your Body mass index is 24.96 kg/m. If this is out of the aformentioned range listed, please consider follow up with your Primary Care Provider.   ________________________________________________________  The [place] [facility] providers would like to encourage you to use [id number] to communicate with providers for non-urgent requests or questions.  Due to long hold times on the telephone, sending your provider a message by [facility] may be a  faster and more efficient way to get a response.  Please allow 48 business hours for a response.  Please remember that this is for non-urgent requests.  _______________________________________________________ It was a pleasure to see you today!  Thank you for trusting me with your gastrointestinal care!

## 2023-05-20 ENCOUNTER — Other Ambulatory Visit: Payer: Self-pay

## 2023-05-20 DIAGNOSIS — D509 Iron deficiency anemia, unspecified: Secondary | ICD-10-CM

## 2023-05-20 DIAGNOSIS — E538 Deficiency of other specified B group vitamins: Secondary | ICD-10-CM

## 2023-06-01 ENCOUNTER — Other Ambulatory Visit: Payer: Self-pay | Admitting: Physician Assistant

## 2023-06-01 NOTE — Telephone Encounter (Signed)
 Last Fill: 02/16/2023  Labs: 05/19/2023 RBC 3.22, Hgb 9.5, Hct 29.7, RDW 18.6, Eosinophils Relative 7.4, BAsophils Absolute 0.2, Glucose 159, BUN 25  Next Visit: 08/04/2023  Last Visit: 05/04/2023  DX: Rheumatoid arthritis of multiple sites with negative rheumatoid factor   Current Dose per office note 05/04/2023: Arava 20 mg 1 tablet by mouth daily   Okay to refill Arava ?

## 2023-06-14 ENCOUNTER — Other Ambulatory Visit: Payer: Self-pay | Admitting: Physician Assistant

## 2023-06-14 NOTE — Telephone Encounter (Signed)
 Last Fill: 03/23/2023  Eye exam: 04/25/2023 Pt unable to do OCT/HVF due to mobility. Mac appeared normal on DFE, no PLQ toxicity noted.[facility] Follow up 6 months   Labs: 05/19/2023 RBC 3.22, Hgb 9.5, Hct 29.7, RDW 18.6, Eosinophils Relative 7.4, Basophils Absolute 0.2, Glucose 159, BUN 25  Next Visit: 08/04/2023  Last Visit: 05/04/2023  [id number] arthritis of multiple sites with negative rheumatoid factor   Current Dose per office note 05/04/2023: plaquenil 200 mg 1 tablet by mouth daily M-F.   Okay to refill Plaquenil?

## 2023-06-28 ENCOUNTER — Telehealth: Payer: Self-pay | Admitting: Oncology

## 2023-06-30 ENCOUNTER — Encounter: Admitting: Oncology

## 2023-06-30 ENCOUNTER — Other Ambulatory Visit

## 2023-07-07 ENCOUNTER — Inpatient Hospital Stay

## 2023-07-07 ENCOUNTER — Inpatient Hospital Stay: Attending: Oncology | Admitting: Oncology

## 2023-07-07 VITALS — BP 150/79 | HR 85 | Temp 98.0°F | Resp 19

## 2023-07-07 DIAGNOSIS — M0609 Rheumatoid arthritis without rheumatoid factor, multiple sites: Secondary | ICD-10-CM

## 2023-07-07 DIAGNOSIS — D649 Anemia, unspecified: Secondary | ICD-10-CM | POA: Insufficient documentation

## 2023-07-07 DIAGNOSIS — Z79899 Other long term (current) drug therapy: Secondary | ICD-10-CM | POA: Diagnosis not present

## 2023-07-07 DIAGNOSIS — M109 Gout, unspecified: Secondary | ICD-10-CM | POA: Insufficient documentation

## 2023-07-07 DIAGNOSIS — M069 Rheumatoid arthritis, unspecified: Secondary | ICD-10-CM | POA: Diagnosis not present

## 2023-07-07 DIAGNOSIS — R110 Nausea: Secondary | ICD-10-CM | POA: Insufficient documentation

## 2023-07-07 LAB — CBC WITH DIFFERENTIAL (CANCER CENTER ONLY)
Abs Immature Granulocytes: 0.03 10*3/uL (ref 0.00–0.07)
Basophils Absolute: 0.1 10*3/uL (ref 0.0–0.1)
Basophils Relative: 1 %
Eosinophils Absolute: 0.8 10*3/uL — ABNORMAL HIGH (ref 0.0–0.5)
Eosinophils Relative: 9 %
HCT: 29.1 % — ABNORMAL LOW (ref 36.0–46.0)
Hemoglobin: 9.1 g/dL — ABNORMAL LOW (ref 12.0–15.0)
Immature Granulocytes: 0 %
Lymphocytes Relative: 16 %
Lymphs Abs: 1.5 10*3/uL (ref 0.7–4.0)
MCH: 28.3 pg (ref 26.0–34.0)
MCHC: 31.3 g/dL (ref 30.0–36.0)
MCV: 90.4 fL (ref 80.0–100.0)
Monocytes Absolute: 0.6 10*3/uL (ref 0.1–1.0)
Monocytes Relative: 7 %
Neutro Abs: 5.8 10*3/uL (ref 1.7–7.7)
Neutrophils Relative %: 67 %
Platelet Count: 293 10*3/uL (ref 150–400)
RBC: 3.22 MIL/uL — ABNORMAL LOW (ref 3.87–5.11)
RDW: 17.2 % — ABNORMAL HIGH (ref 11.5–15.5)
WBC Count: 8.8 10*3/uL (ref 4.0–10.5)
nRBC: 0 % (ref 0.0–0.2)

## 2023-07-07 LAB — CMP (CANCER CENTER ONLY)
ALT: 6 U/L (ref 0–44)
AST: 10 U/L — ABNORMAL LOW (ref 15–41)
Albumin: 4.1 g/dL (ref 3.5–5.0)
Alkaline Phosphatase: 76 U/L (ref 38–126)
Anion gap: 11 (ref 5–15)
BUN: 31 mg/dL — ABNORMAL HIGH (ref 8–23)
CO2: 23 mmol/L (ref 22–32)
Calcium: 9.4 mg/dL (ref 8.9–10.3)
Chloride: 105 mmol/L (ref 98–111)
Creatinine: 1.06 mg/dL — ABNORMAL HIGH (ref 0.44–1.00)
GFR, Estimated: 53 mL/min — ABNORMAL LOW (ref >60)
Glucose, Bld: 116 mg/dL — ABNORMAL HIGH (ref 70–99)
Potassium: 4.3 mmol/L (ref 3.5–5.1)
Sodium: 139 mmol/L (ref 135–145)
Total Bilirubin: 0.2 mg/dL (ref 0.0–1.2)
Total Protein: 7.8 g/dL (ref 6.5–8.1)

## 2023-07-07 LAB — FOLATE: Folate: >40 ng/mL (ref >5.9)

## 2023-07-07 LAB — IRON AND IRON BINDING CAPACITY (CC-WL,HP ONLY)
Iron: 39 ug/dL (ref 28–170)
Saturation Ratios: 12 % (ref 10.4–31.8)
TIBC: 314 ug/dL (ref 250–450)
UIBC: 275 ug/dL

## 2023-07-07 LAB — LACTATE DEHYDROGENASE: LDH: 101 U/L (ref 98–192)

## 2023-07-07 LAB — DIRECT ANTIGLOBULIN TEST (NOT AT ARMC)
DAT, IgG: NEGATIVE
DAT, complement: NEGATIVE

## 2023-07-07 LAB — VITAMIN B12: Vitamin B-12: 3184 pg/mL — ABNORMAL HIGH (ref 180–914)

## 2023-07-07 NOTE — Progress Notes (Signed)
 [facility]  HEMATOLOGY CLINIC CONSULTATION NOTE   PATIENT NAME: Mary [name]   MR#: [id number] DOB: 04-Sep-1940  DATE OF SERVICE: 07/07/2023  Patient Care Team: Mary Sensing, NP as PCP - General  REASON FOR CONSULTATION/ CHIEF COMPLAINT:  Evaluation of anemia.  ASSESSMENT & PLAN:   Mary [name] is a 83 y.o. very pleasant lady with a past medical history of rheumatoid arthritis on Plaquenil  and leflunomide , hypertension, osteopenia, hypothyroidism, diabetes mellitus, hiatal hernia, was referred to our service for evaluation of normocytic anemia.    Normocytic anemia Chronic anemia with hemoglobin in the range of 8-10, fluctuating over 6-7 years. 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   Recent iron labs in March 2025 showed slightly low iron levels; she is on prescription iron supplements.   No acute blood loss or significant symptoms.  Labs today showed stable hemoglobin of 9.1, hematocrit 28.1, MCV 90.4.  White count 8800 with normal differential.  Platelet count normal.  Creatinine 1.06.  Iron studies show no evidence of iron deficiency.  Ferritin pending.  B12, folate, LDH are unremarkable.  Coombs test negative.  Haptoglobin pending.  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  We will discuss results of remaining workup over the phone next week.  - Continue current iron supplementation.  I do not see need for IV iron at this time.  - Follow-up in four months with repeat labs.  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 Potential contribution to anemia through chronic inflammation and medication side effects.   I reviewed lab results and outside records for this  visit and discussed relevant results with the patient. Diagnosis, plan of care and treatment options were also discussed in detail with the patient. Opportunity provided to ask questions and answers provided to her apparent satisfaction. Provided instructions to call our clinic with any problems, questions or concerns prior to return visit. I recommended to continue follow-up with PCP and sub-specialists. She verbalized understanding and agreed with the plan. No barriers to learning was detected.  Mary Franko, MD [facility] - A DEPT OF [facility] [address] [place] [address] Dept: [phone] Dept Fax: [phone]  07/07/2023 5:13 PM  HISTORY OF PRESENT ILLNESS:  Discussed the use of AI scribe software for clinical note transcription with the patient, who gave verbal consent to proceed.  History of Present Illness Mary Devega "Archibald Beard" is an 83 year old pleasant lady with rheumatoid arthritis who presents with anemia. She is accompanied by her daughter, Mary [name]. She was referred by her GI doctor for evaluation of anemia.  On 05/19/2023, labs at her gastroenterologist office showed hemoglobin of 9.5, hematocrit 29.7, MCV 92.2.  White count 5800 with normal differential, platelet count 307,000.  Iron was decreased at 40.  B12 normal at 286.  Folate normal.  She was referred to us  for further evaluation of anemia.  On review of records, she has had fluctuating anemia at least since 2018 with hemoglobin in the range of 8.8-11.3.  No active bleeding, such as epistaxis or gum bleeds, and no significant weight loss or decreased appetite, although she experiences fullness and pain due to a small hernia, which affects her eating habits. She also experiences nausea when  eating.  She has been on iron supplements, prescribed by her GI doctor, and takes them every other day due to constipation. She has been on this regimen for the  past five to six weeks. Her recent blood work at her primary care doctor's office showed improvement, although specific values were not provided to her.  She has a long-standing history of rheumatoid arthritis, managed with leflunomide  (Arava ) and hydroxychloroquine  (Plaquenil ). She has been on treatment for over forty years. Her arthritis doctor has indicated that her medications could contribute to her anemia.  She also has a history of gout, which influences her dietary choices, such as avoiding beef liver despite her preference for it.    MEDICAL HISTORY:  Past Medical History:  Diagnosis Date   Anemia    Antritis (stomach)    Aortic aneurysm (HCC)    Complication of anesthesia    Diabetes mellitus without complication (HCC)    Dyspnea    Gout    Hiatal hernia    High cholesterol    History of kidney stones    Hypertension    Hypoxia 08/2016   Low back pain    Osteoarthritis    Osteopenia    PONV (postoperative nausea and vomiting)    Rheumatoid arthritis (HCC)     SURGICAL HISTORY: Past Surgical History:  Procedure Laterality Date   ABDOMINAL HYSTERECTOMY      SOCIAL HISTORY: She reports that she has never smoked. She has been exposed to tobacco smoke. She has never used smokeless tobacco. She reports that she does not drink alcohol and does not use drugs. Social History   Socioeconomic History   Marital status: Married    Spouse name: Not on file   Number of children: Not on file   Years of education: Not on file   Highest education level: Not on file  Occupational History   Not on file  Tobacco Use   Smoking status: Never    Passive exposure: Past   Smokeless tobacco: Never  Vaping Use   Vaping status: Never Used  Substance and Sexual Activity   Alcohol use: No   Drug use: Never   Sexual activity: Not on file  Other Topics Concern   Not on file  Social History Narrative   Not on file   Social Drivers of Health   Financial Resource Strain: Not on  file  Food Insecurity: Not on file  Transportation Needs: Not on file  Physical Activity: Not on file  Stress: Not on file  Social Connections: Not on file  Intimate Partner Violence: Not on file    FAMILY HISTORY: Family History  Problem Relation Age of Onset   Arthritis Brother    Arthritis Sister    Arthritis Sister    Arthritis Brother    Arthritis Brother    Arthritis Brother     ALLERGIES:  She is allergic to aspirin and codeine.  MEDICATIONS:  Current Outpatient Medications  Medication Sig Dispense Refill   acetaminophen  (TYLENOL ) 500 MG tablet as needed.      albuterol  (PROVENTIL ) (2.5 MG/3ML) 0.083% nebulizer solution Take 3 mLs (2.5 mg total) by nebulization every 6 (six) hours as needed for wheezing or shortness of breath. 360 mL 5   allopurinol  (ZYLOPRIM ) 100 MG tablet Take 200 mg by mouth daily.      amLODipine (NORVASC) 2.5 MG tablet Take 2.5 mg by mouth daily.     atorvastatin  (LIPITOR) 20 MG tablet SMARTSIG:1 Tablet(s) By Mouth Every Evening  benazepril  (LOTENSIN ) 20 MG tablet Take 1 tablet (20 mg total) by mouth daily. 31 tablet 0   carvedilol  (COREG ) 6.25 MG tablet      Cholecalciferol  (VITAMIN D3) 5000 units TABS Take 1 tablet by mouth. Takes on Monday, Wednesday, and Friday.     clorazepate  (TRANXENE ) 7.5 MG tablet Take 7.5 mg by mouth 2 (two) times daily.      FEROSUL 325 (65 Fe) MG tablet Take 325 mg by mouth daily.     folic acid  (FOLVITE ) 1 MG tablet Take 2 mg by mouth daily.     hydroxychloroquine  (PLAQUENIL ) 200 MG tablet Take 1 tablet by mouth daily Monday-Fridays only. 60 tablet 0   ipratropium-albuterol  (DUONEB) 0.5-2.5 (3) MG/3ML SOLN Take 3 mLs by nebulization 4 (four) times daily as needed. 360 mL 11   leflunomide  (ARAVA ) 20 MG tablet TAKE ONE TABLET BY MOUTH EVERY DAY 90 tablet 0   levothyroxine  (SYNTHROID ) 100 MCG tablet Take 100 mcg by mouth daily.     magnesium oxide (MAG-OX) 400 MG tablet Take 1 tablet by mouth daily.     metFORMIN  (GLUCOPHAGE-XR) 500 MG 24 hr tablet Take 500 mg by mouth daily.     omeprazole (PRILOSEC) 40 MG capsule Take 40 mg by mouth daily.     Polyethylene Glycol 3350  (MIRALAX  PO) Take by mouth. (Patient not taking: Reported on 05/04/2023)     No current facility-administered medications for this visit.    REVIEW OF SYSTEMS:    Review of Systems - Oncology  All other pertinent systems were reviewed and were negative except as mentioned above.  PHYSICAL EXAMINATION:    Onc Performance Status - 07/07/23 1516       ECOG Perf Status   ECOG Perf Status Capable of only limited selfcare, confined to bed or chair more than 50% of waking hours      KPS SCALE   KPS % SCORE Requires occasional assistance but is able to care for most needs             Vitals:   07/07/23 1501  BP: (!) 150/79  Pulse: 85  Resp: 19  Temp: 98 F (36.7 C)  SpO2: 98%   There were no vitals filed for this visit.  Physical Exam Constitutional:      General: She is not in acute distress.    Comments: Presented to clinic in a wheelchair  HENT:     Head: Normocephalic and atraumatic.  Eyes:     General: No scleral icterus.    Conjunctiva/sclera: Conjunctivae normal.  Cardiovascular:     Rate and Rhythm: Normal rate and regular rhythm.  Pulmonary:     Effort: Pulmonary effort is normal.  Abdominal:     General: There is no distension.  Musculoskeletal:     Comments: Deformities related to rheumatoid arthritis noted in bilateral hands, more prominent in the right hand  Neurological:     General: No focal deficit present.     Mental Status: She is alert and oriented to person, place, and time.  Psychiatric:        Mood and Affect: Mood normal.        Behavior: Behavior normal.        Thought Content: Thought content normal.     LABORATORY DATA:   I have reviewed the data as listed.  Results for orders placed or performed in visit on 07/07/23  Lactate dehydrogenase  Result Value Ref Range   LDH  101 98 -  192 U/L  Folate  Result Value Ref Range   Folate >40.0 >5.9 ng/mL  Vitamin B12  Result Value Ref Range   Vitamin B-12 3,184 (H) 180 - 914 pg/mL  Iron and Iron Binding Capacity ([facility] only)  Result Value Ref Range   Iron 39 28 - 170 ug/dL   TIBC 621 308 - 657 ug/dL   Saturation Ratios 12 10.4 - 31.8 %   UIBC 275 ug/dL  CMP (Cancer Center only)  Result Value Ref Range   Sodium 139 135 - 145 mmol/L   Potassium 4.3 3.5 - 5.1 mmol/L   Chloride 105 98 - 111 mmol/L   CO2 23 22 - 32 mmol/L   Glucose, Bld 116 (H) 70 - 99 mg/dL   BUN 31 (H) 8 - 23 mg/dL   Creatinine 8.46 (H) 9.62 - 1.00 mg/dL   Calcium  9.4 8.9 - 10.3 mg/dL   Total Protein 7.8 6.5 - 8.1 g/dL   Albumin 4.1 3.5 - 5.0 g/dL   AST 10 (L) 15 - 41 U/L   ALT 6 0 - 44 U/L   Alkaline Phosphatase 76 38 - 126 U/L   Total Bilirubin 0.2 0.0 - 1.2 mg/dL   GFR, Estimated 53 (L) >60 mL/min   Anion gap 11 5 - 15  CBC with Differential (Cancer Center Only)  Result Value Ref Range   WBC Count 8.8 4.0 - 10.5 K/uL   RBC 3.22 (L) 3.87 - 5.11 MIL/uL   Hemoglobin 9.1 (L) 12.0 - 15.0 g/dL   HCT 95.2 (L) 84.1 - 32.4 %   MCV 90.4 80.0 - 100.0 fL   MCH 28.3 26.0 - 34.0 pg   MCHC 31.3 30.0 - 36.0 g/dL   RDW 40.1 (H) 02.7 - 25.3 %   Platelet Count 293 150 - 400 K/uL   nRBC 0.0 0.0 - 0.2 %   Neutrophils Relative % 67 %   Neutro Abs 5.8 1.7 - 7.7 K/uL   Lymphocytes Relative 16 %   Lymphs Abs 1.5 0.7 - 4.0 K/uL   Monocytes Relative 7 %   Monocytes Absolute 0.6 0.1 - 1.0 K/uL   Eosinophils Relative 9 %   Eosinophils Absolute 0.8 (H) 0.0 - 0.5 K/uL   Basophils Relative 1 %   Basophils Absolute 0.1 0.0 - 0.1 K/uL   Immature Granulocytes 0 %   Abs Immature Granulocytes 0.03 0.00 - 0.07 K/uL  Direct antiglobulin test (not at [facility])  Result Value Ref Range   DAT, complement NEG    DAT, IgG      NEG Performed at [facility], [address], [place], [place] [address]      RADIOGRAPHIC STUDIES:  No recent  pertinent imaging studies available to review.  Orders Placed This Encounter  Procedures   CBC with Differential (Cancer Center Only)    Standing Status:   Future    Number of Occurrences:   1    Expiration Date:   07/06/2024   CMP (Cancer Center only)    Standing Status:   Future    Number of Occurrences:   1    Expiration Date:   07/06/2024   Iron and Iron Binding Capacity ([facility] only)    Standing Status:   Future    Number of Occurrences:   1    Expiration Date:   07/06/2024   Ferritin    Standing Status:   Future    Number of Occurrences:   1    Expiration  Date:   07/06/2024   Vitamin B12    Standing Status:   Future    Number of Occurrences:   1    Expiration Date:   07/06/2024   Methylmalonic acid, serum    Standing Status:   Future    Number of Occurrences:   1    Expiration Date:   07/06/2024   Folate    Standing Status:   Future    Number of Occurrences:   1    Expiration Date:   07/06/2024   Lactate dehydrogenase    Standing Status:   Future    Number of Occurrences:   1    Expiration Date:   07/06/2024   Haptoglobin    Standing Status:   Future    Number of Occurrences:   1    Expiration Date:   07/06/2024   Direct antiglobulin test (not at [facility])    Standing Status:   Future    Number of Occurrences:   1    Expiration Date:   07/06/2024    Future Appointments  Date Time Provider Department Center  07/15/2023 12:00 PM Ellysa Parrack, Gale Jude, MD [facility] None  08/04/2023  2:10 PM Romayne Clubs, PA-C [facility] None  08/10/2023 10:20 AM Edmonia Gottron, PA-C [facility]  11/03/2023  2:30 PM [facility] LAB [facility] None  11/03/2023  3:00 PM Deyanna Mctier, Gale Jude, MD [facility] None  11/10/2023  1:40 PM Deveshwar, Clydie Darter, MD [facility] None     I spent a total of 55 minutes during this encounter with the patient including review of chart and various tests results, discussions about plan of care and coordination of care plan.  This document was completed utilizing speech recognition  software. Grammatical errors, random word insertions, pronoun errors, and incomplete sentences are an occasional consequence of this system due to software limitations, ambient noise, and hardware issues. Any formal questions or concerns about the content, text or information contained within the body of this dictation should be directly addressed to the provider for clarification.

## 2023-07-08 ENCOUNTER — Encounter: Payer: Self-pay | Admitting: Oncology

## 2023-07-08 DIAGNOSIS — D649 Anemia, unspecified: Secondary | ICD-10-CM | POA: Insufficient documentation

## 2023-07-08 LAB — FERRITIN: Ferritin: 256 ng/mL (ref 11–307)

## 2023-07-08 LAB — HAPTOGLOBIN: Haptoglobin: 319 mg/dL (ref 41–333)

## 2023-07-08 NOTE — Assessment & Plan Note (Signed)
 Rheumatoid arthritis for over 40 years, managed with leflunomide  and Plaquenil . Potential contribution to anemia through chronic inflammation and medication side effects.

## 2023-07-08 NOTE — Assessment & Plan Note (Addendum)
 Chronic anemia with hemoglobin in the range of 8-10, fluctuating over 6-7 years. 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   Recent iron labs in March 2025 showed slightly low iron levels; she is on prescription iron supplements.   No acute blood loss or significant symptoms.  Labs today showed stable hemoglobin of 9.1, hematocrit 28.1, MCV 90.4.  White count 8800 with normal differential.  Platelet count normal.  Creatinine 1.06.  Iron studies show no evidence of iron deficiency.  Ferritin pending.  B12, folate, LDH are unremarkable.  Coombs test negative.  Haptoglobin pending.  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  We will discuss results of remaining workup over the phone next week.  - Continue current iron supplementation.  I do not see need for IV iron at this time.  - Follow-up in four months with repeat labs.

## 2023-07-12 LAB — METHYLMALONIC ACID, SERUM: Methylmalonic Acid, Quantitative: 189 nmol/L (ref 0–378)

## 2023-07-15 ENCOUNTER — Encounter: Payer: Self-pay | Admitting: Oncology

## 2023-07-15 ENCOUNTER — Inpatient Hospital Stay (HOSPITAL_BASED_OUTPATIENT_CLINIC_OR_DEPARTMENT_OTHER): Admitting: Oncology

## 2023-07-15 DIAGNOSIS — D649 Anemia, unspecified: Secondary | ICD-10-CM | POA: Diagnosis not present

## 2023-07-15 NOTE — Assessment & Plan Note (Signed)
  Chronic anemia with hemoglobin in the range of 8-10, fluctuating over 6-7 years. 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    Recent iron labs in March 2025 showed slightly low iron levels; she is on prescription iron supplements.    No acute blood loss or significant symptoms.   On initial consultation with us  on 07/07/2023, labs showed stable hemoglobin of 9.1, hematocrit 28.1, MCV 90.4.  White count 8800 with normal differential.  Platelet count normal.  Creatinine 1.06.  Iron studies showed no evidence of iron deficiency.  Ferritin, B12, folate, methylmalonic acid, LDH, haptoglobin were all unremarkable.  Coombs test negative.    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   Continue current iron supplementation.  I do not see need for IV iron at this time.  - Follow-up in four months with repeat labs.

## 2023-07-15 NOTE — Progress Notes (Signed)
 [facility]  HEMATOLOGY-ONCOLOGY ELECTRONIC VISIT PROGRESS NOTE  PATIENT NAME: Mary [name]   MR#: [id number] DOB: 1941-01-26  DATE OF SERVICE: 07/15/2023  Patient Care Team: Marce Sensing, NP as PCP - General  I connected with the patient via telephone conference and verified that I am speaking with the correct person using two identifiers. The patient's location is at home and I am providing care from the [facility].  I discussed the limitations, risks, security and privacy concerns of performing an evaluation and management service by e-visits and the availability of in person appointments. I also discussed with the patient that there may be a patient responsible charge related to this service. The patient expressed understanding and agreed to proceed.   ASSESSMENT & PLAN:   Mary [name] is a 83 y.o. very pleasant lady with a past medical history of rheumatoid arthritis on Plaquenil  and leflunomide , hypertension, osteopenia, hypothyroidism, diabetes mellitus, hiatal hernia, was referred to our service in May 2025 for evaluation of normocytic anemia.  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  Normocytic anemia  Chronic anemia with hemoglobin in the range of 8-10, fluctuating over 6-7 years. 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    Recent iron labs in March 2025 showed slightly low iron levels; she is on prescription iron supplements.    No acute blood loss or significant symptoms.   On initial consultation with us  on 07/07/2023, labs showed stable hemoglobin of 9.1, hematocrit 28.1, MCV 90.4.  White count 8800 with normal differential.  Platelet count normal.  Creatinine 1.06.  Iron studies showed no evidence of iron deficiency.  Ferritin, B12, folate, methylmalonic acid, LDH, haptoglobin were all  unremarkable.  Coombs test negative.    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   Continue current iron supplementation.  I do not see need for IV iron at this time.  - Follow-up in four months with repeat labs.      I discussed the assessment and treatment plan with the patient. The patient was provided an opportunity to ask questions and all were answered. The patient agreed with the plan and demonstrated an understanding of the instructions. The patient was advised to call back or seek an in-person evaluation if the symptoms worsen or if the condition fails to improve as anticipated.    I spent 11 minutes over the phone with the patient reviewing test results, discuss management and coordination/planning of care.  Arlo Berber, MD 07/15/2023 2:33 PM  [facility] - A DEPT OF [facility] [address] [place] [address] Dept: [phone] Dept Fax: [phone]   INTERVAL HISTORY:  Please see above for problem oriented charting.  The purpose of today's discussion is to explain recent lab results and to formulate plan of care.  Discussed the use of AI scribe software for clinical note transcription with the patient, who gave verbal consent to proceed.  History of Present Illness Mary [name] "Archibald Beard" is an 83 year old female with rheumatoid arthritis who presents for follow-up of mild anemia.  She has a history of rheumatoid arthritis and is currently experiencing mild anemia. Her hemoglobin level was stable at 9.1 g/dL during the recent workup. Other lab results, including white blood cell count, platelet count, vitamin B12, folic acid ,  and iron levels, were normal.  She is currently taking iron and folic acid  supplements as part of her treatment regimen.    SUMMARY OF HEMATOLOGY HISTORY:  She was referred by her GI doctor for  evaluation of anemia.   On 05/19/2023, labs at her gastroenterologist office showed hemoglobin of 9.5, hematocrit 29.7, MCV 92.2.  White count 5800 with normal differential, platelet count 307,000.  Iron was decreased at 40.  B12 normal at 286.  Folate normal.  She was referred to us  for further evaluation of anemia.  On review of records, she has had fluctuating anemia at least since 2018 with hemoglobin in the range of 8.8-11.3.   No active bleeding, such as epistaxis or gum bleeds, and no significant weight loss or decreased appetite, although she experiences fullness and pain due to a small hernia, which affects her eating habits. She also experiences nausea when eating.   She has been on iron supplements, prescribed by her GI doctor, and takes them every other day due to constipation. She has been on this regimen for the past five to six weeks. Her recent blood work at her primary care doctor's office showed improvement, although specific values were not provided to her.   She has a long-standing history of rheumatoid arthritis, managed with leflunomide  (Arava ) and hydroxychloroquine  (Plaquenil ). She has been on treatment for over forty years. Her arthritis doctor has indicated that her medications could contribute to her anemia.   She also has a history of gout, which influences her dietary choices, such as avoiding beef liver despite her preference for it.   Chronic anemia with hemoglobin in the range of 8-10, fluctuating over 6-7 years. 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    Recent iron labs in March 2025 showed slightly low iron levels; she is on prescription iron supplements.    No acute blood loss or significant symptoms.   On initial consultation with us  on 07/07/2023, labs showed stable hemoglobin of 9.1, hematocrit 28.1, MCV 90.4.  White count 8800 with normal differential.   Platelet count normal.  Creatinine 1.06.  Iron studies showed no evidence of iron deficiency.  Ferritin, B12, folate, methylmalonic acid, LDH, haptoglobin were all unremarkable.  Coombs test negative.    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   Continue current iron supplementation.  I do not see need for IV iron at this time.    REVIEW OF SYSTEMS:    Review of Systems - Oncology  All other pertinent systems were reviewed with the patient and are negative.  I have reviewed the past medical history, past surgical history, social history and family history with the patient and they are unchanged from previous note.  ALLERGIES:  She is allergic to aspirin and codeine.  MEDICATIONS:  Current Outpatient Medications  Medication Sig Dispense Refill   acetaminophen  (TYLENOL ) 500 MG tablet as needed.      albuterol  (PROVENTIL ) (2.5 MG/3ML) 0.083% nebulizer solution Take 3 mLs (2.5 mg total) by nebulization every 6 (six) hours as needed for wheezing or shortness of breath. 360 mL 5   allopurinol  (ZYLOPRIM ) 100 MG tablet Take 200 mg by mouth daily.      amLODipine (NORVASC) 2.5 MG tablet Take 2.5 mg by mouth daily.     atorvastatin  (LIPITOR) 20 MG tablet SMARTSIG:1 Tablet(s) By Mouth Every Evening     benazepril  (LOTENSIN ) 20  MG tablet Take 1 tablet (20 mg total) by mouth daily. 31 tablet 0   carvedilol  (COREG ) 6.25 MG tablet      Cholecalciferol  (VITAMIN D3) 5000 units TABS Take 1 tablet by mouth. Takes on Monday, Wednesday, and Friday.     clorazepate  (TRANXENE ) 7.5 MG tablet Take 7.5 mg by mouth 2 (two) times daily.      FEROSUL 325 (65 Fe) MG tablet Take 325 mg by mouth daily.     folic acid  (FOLVITE ) 1 MG tablet Take 2 mg by mouth daily.     hydroxychloroquine  (PLAQUENIL ) 200 MG tablet Take 1 tablet by mouth daily Monday-Fridays only. 60 tablet 0   ipratropium-albuterol  (DUONEB) 0.5-2.5 (3) MG/3ML SOLN Take 3 mLs  by nebulization 4 (four) times daily as needed. 360 mL 11   leflunomide  (ARAVA ) 20 MG tablet TAKE ONE TABLET BY MOUTH EVERY DAY 90 tablet 0   levothyroxine  (SYNTHROID ) 100 MCG tablet Take 100 mcg by mouth daily.     magnesium oxide (MAG-OX) 400 MG tablet Take 1 tablet by mouth daily.     metFORMIN (GLUCOPHAGE-XR) 500 MG 24 hr tablet Take 500 mg by mouth daily.     omeprazole (PRILOSEC) 40 MG capsule Take 40 mg by mouth daily.     Polyethylene Glycol 3350  (MIRALAX  PO) Take by mouth. (Patient not taking: Reported on 05/04/2023)     No current facility-administered medications for this visit.    PHYSICAL EXAMINATION:    Onc Performance Status - 07/15/23 1200       ECOG Perf Status   ECOG Perf Status Capable of only limited selfcare, confined to bed or chair more than 50% of waking hours      KPS SCALE   KPS % SCORE Cares for self, unable to carry on normal activity or to do active work             LABORATORY DATA:   I have reviewed the data as listed.  Recent Results (from the past 2160 hours)  COMPLETE METABOLIC PANEL WITH GFR     Status: Abnormal   Collection Time: 05/04/23  3:03 PM  Result Value Ref Range   Glucose, Bld 179 (H) 65 - 99 mg/dL    Comment: .            Fasting reference interval . For someone without known diabetes, a glucose value >125 mg/dL indicates that they may have diabetes and this should be confirmed with a follow-up test. .    BUN 28 (H) 7 - 25 mg/dL   Creat 1.61 (H) 0.96 - 0.95 mg/dL   eGFR 57 (L) > OR = 60 mL/min/1.54m2   BUN/Creatinine Ratio 28 (H) 6 - 22 (calc)   Sodium 138 135 - 146 mmol/L   Potassium 4.2 3.5 - 5.3 mmol/L   Chloride 105 98 - 110 mmol/L   CO2 19 (L) 20 - 32 mmol/L   Calcium  9.3 8.6 - 10.4 mg/dL   Total Protein 6.9 6.1 - 8.1 g/dL   Albumin 3.9 3.6 - 5.1 g/dL   Globulin 3.0 1.9 - 3.7 g/dL (calc)   AG Ratio 1.3 1.0 - 2.5 (calc)   Total Bilirubin 0.2 0.2 - 1.2 mg/dL   Alkaline phosphatase (APISO) 86 37 - 153 U/L   AST  9 (L) 10 - 35 U/L   ALT 6 6 - 29 U/L  CBC with Differential/Platelet     Status: Abnormal   Collection Time: 05/04/23  3:03 PM  Result Value Ref  Range   WBC 8.4 3.8 - 10.8 Thousand/uL   RBC 3.12 (L) 3.80 - 5.10 Million/uL   Hemoglobin 8.8 (L) 11.7 - 15.5 g/dL   HCT 16.1 (L) 09.6 - 04.5 %   MCV 91.7 80.0 - 100.0 fL   MCH 28.2 27.0 - 33.0 pg   MCHC 30.8 (L) 32.0 - 36.0 g/dL    Comment: For adults, a slight decrease in the calculated MCHC value (in the range of 30 to 32 g/dL) is most likely not clinically significant; however, it should be interpreted with caution in correlation with other red cell parameters and the patient's clinical condition.    RDW 16.1 (H) 11.0 - 15.0 %   Platelets 275 140 - 400 Thousand/uL   MPV 13.0 (H) 7.5 - 12.5 fL   Neutro Abs 5,830 1,500 - 7,800 cells/uL   Absolute Lymphocytes 1,436 850 - 3,900 cells/uL   Absolute Monocytes 571 200 - 950 cells/uL   Eosinophils Absolute 470 15 - 500 cells/uL   Basophils Absolute 92 0 - 200 cells/uL   Neutrophils Relative % 69.4 %   Total Lymphocyte 17.1 %   Monocytes Relative 6.8 %   Eosinophils Relative 5.6 %   Basophils Relative 1.1 %  Uric acid     Status: None   Collection Time: 05/04/23  3:03 PM  Result Value Ref Range   Uric Acid, Serum 4.7 2.5 - 7.0 mg/dL    Comment: Therapeutic target for gout patients: <6.0 mg/dL .   Reticulocytes     Status: None   Collection Time: 05/19/23  3:22 PM  Result Value Ref Range   Retic Ct Pct 1.7 %   ABS Retic 53,890 20,000 - 80,000 cells/uL  Folate     Status: None   Collection Time: 05/19/23  3:22 PM  Result Value Ref Range   Folate >25.2 >5.9 ng/mL  Vitamin B12     Status: None   Collection Time: 05/19/23  3:22 PM  Result Value Ref Range   Vitamin B-12 286 211 - 911 pg/mL  IBC + Ferritin     Status: Abnormal   Collection Time: 05/19/23  3:22 PM  Result Value Ref Range   Iron 40 (L) 42 - 145 ug/dL   Transferrin 409.8 (L) 212.0 - 360.0 mg/dL   Saturation Ratios  11.9 (L) 20.0 - 50.0 %   Ferritin 199.8 10.0 - 291.0 ng/mL   TIBC 274.4 250.0 - 450.0 mcg/dL  Comprehensive metabolic panel     Status: Abnormal   Collection Time: 05/19/23  3:22 PM  Result Value Ref Range   Sodium 139 135 - 145 mEq/L   Potassium 4.0 3.5 - 5.1 mEq/L   Chloride 108 96 - 112 mEq/L   CO2 23 19 - 32 mEq/L   Glucose, Bld 159 (H) 70 - 99 mg/dL   BUN 25 (H) 6 - 23 mg/dL   Creatinine, Ser 1.47 0.40 - 1.20 mg/dL   Total Bilirubin 0.3 0.2 - 1.2 mg/dL   Alkaline Phosphatase 87 39 - 117 U/L   AST 9 0 - 37 U/L   ALT 5 0 - 35 U/L   Total Protein 8.0 6.0 - 8.3 g/dL   Albumin 4.0 3.5 - 5.2 g/dL   GFR 82.95 >62.13 mL/min    Comment: Calculated using the CKD-EPI Creatinine Equation (2021)   Calcium  9.5 8.4 - 10.5 mg/dL  CBC with Differential/Platelet     Status: Abnormal   Collection Time: 05/19/23  3:22 PM  Result Value Ref  Range   WBC 7.8 4.0 - 10.5 K/uL   RBC 3.22 (L) 3.87 - 5.11 Mil/uL   Hemoglobin 9.5 (L) 12.0 - 15.0 g/dL   HCT 16.1 (L) 09.6 - 04.5 %   MCV 92.2 78.0 - 100.0 fl   MCHC 31.9 30.0 - 36.0 g/dL   RDW 40.9 (H) 81.1 - 91.4 %   Platelets 307.0 150.0 - 400.0 K/uL   Neutrophils Relative % 67.2 43.0 - 77.0 %   Lymphocytes Relative 15.7 12.0 - 46.0 %   Monocytes Relative 7.7 3.0 - 12.0 %   Eosinophils Relative 7.4 (H) 0.0 - 5.0 %   Basophils Relative 2.0 0.0 - 3.0 %   Neutro Abs 5.2 1.4 - 7.7 K/uL   Lymphs Abs 1.2 0.7 - 4.0 K/uL   Monocytes Absolute 0.6 0.1 - 1.0 K/uL   Eosinophils Absolute 0.6 0.0 - 0.7 K/uL   Basophils Absolute 0.2 (H) 0.0 - 0.1 K/uL  Direct antiglobulin test (not at [facility])     Status: None   Collection Time: 07/07/23  3:57 PM  Result Value Ref Range   DAT, complement NEG    DAT, IgG      NEG Performed at [facility], [address] [place], [place] [address]   Haptoglobin     Status: None   Collection Time: 07/07/23  3:57 PM  Result Value Ref Range   Haptoglobin 319 41 - 333 mg/dL    Comment: (NOTE) Performed At:  [facility] [address] [place], [place] [id number] Pearlean Botts MD [id number]   Lactate dehydrogenase     Status: None   Collection Time: 07/07/23  3:57 PM  Result Value Ref Range   LDH 101 98 - 192 U/L    Comment: Performed at [facility], [address] [place], [place] [address]  Methylmalonic acid, serum     Status: None   Collection Time: 07/07/23  3:57 PM  Result Value Ref Range   Methylmalonic Acid, Quantitative 189 0 - 378 nmol/L    Comment: (NOTE) This test was developed and its performance characteristics determined by [facility]. It has not been cleared or approved by the Food and Drug Administration. Performed At: [facility] [address] [place], [place] [id number] Pearlean Botts MD [id number]   Vitamin B12     Status: Abnormal   Collection Time: 07/07/23  3:57 PM  Result Value Ref Range   Vitamin B-12 3,184 (H) 180 - 914 pg/mL    Comment: RESULT CONFIRMED BY MANUAL DILUTION (NOTE) This assay is not validated for testing neonatal or myeloproliferative syndrome specimens for Vitamin B12 levels. Performed at [facility], [address] [place], [place] [address]   Iron and Iron Binding Capacity ([facility] only)     Status: None   Collection Time: 07/07/23  3:57 PM  Result Value Ref Range   Iron 39 28 - 170 ug/dL   TIBC 433 295 - 188 ug/dL   Saturation Ratios 12 10.4 - 31.8 %   UIBC 275 ug/dL    Comment: Performed at [facility], [address], [place], [place] [address]  CMP (Cancer Center only)     Status: Abnormal   Collection Time: 07/07/23  3:57 PM  Result Value Ref Range   Sodium 139 135 - 145 mmol/L   Potassium 4.3 3.5 - 5.1 mmol/L   Chloride 105 98 - 111 mmol/L   CO2 23 22 - 32 mmol/L   Glucose, Bld  116 (H) 70 - 99 mg/dL    Comment: Glucose reference range applies only to samples taken after fasting for at least 8 hours.   BUN 31 (H) 8 - 23 mg/dL   Creatinine  0.98 (H) 0.44 - 1.00 mg/dL   Calcium  9.4 8.9 - 10.3 mg/dL   Total Protein 7.8 6.5 - 8.1 g/dL   Albumin 4.1 3.5 - 5.0 g/dL   AST 10 (L) 15 - 41 U/L   ALT 6 0 - 44 U/L   Alkaline Phosphatase 76 38 - 126 U/L   Total Bilirubin 0.2 0.0 - 1.2 mg/dL   GFR, Estimated 53 (L) >60 mL/min    Comment: (NOTE) Calculated using the CKD-EPI Creatinine Equation (2021)    Anion gap 11 5 - 15    Comment: Performed at [facility], [address] [place], [place] [address]  CBC with Differential (Cancer Center Only)     Status: Abnormal   Collection Time: 07/07/23  3:57 PM  Result Value Ref Range   WBC Count 8.8 4.0 - 10.5 K/uL   RBC 3.22 (L) 3.87 - 5.11 MIL/uL   Hemoglobin 9.1 (L) 12.0 - 15.0 g/dL   HCT 78.2 (L) 95.6 - 21.3 %   MCV 90.4 80.0 - 100.0 fL   MCH 28.3 26.0 - 34.0 pg   MCHC 31.3 30.0 - 36.0 g/dL   RDW 08.6 (H) 57.8 - 46.9 %   Platelet Count 293 150 - 400 K/uL   nRBC 0.0 0.0 - 0.2 %   Neutrophils Relative % 67 %   Neutro Abs 5.8 1.7 - 7.7 K/uL   Lymphocytes Relative 16 %   Lymphs Abs 1.5 0.7 - 4.0 K/uL   Monocytes Relative 7 %   Monocytes Absolute 0.6 0.1 - 1.0 K/uL   Eosinophils Relative 9 %   Eosinophils Absolute 0.8 (H) 0.0 - 0.5 K/uL   Basophils Relative 1 %   Basophils Absolute 0.1 0.0 - 0.1 K/uL   Immature Granulocytes 0 %   Abs Immature Granulocytes 0.03 0.00 - 0.07 K/uL    Comment: Performed at [facility], [address] [place] [address], [place], [place] [address]  Folate     Status: None   Collection Time: 07/07/23  3:58 PM  Result Value Ref Range   Folate >40.0 >5.9 ng/mL    Comment: RESULT CONFIRMED BY MANUAL DILUTION Performed at [facility], [address] [place], [place] [address]   Ferritin     Status: None   Collection Time: 07/07/23  3:58 PM  Result Value Ref Range   Ferritin 256 11 - 307 ng/mL    Comment: Performed at [facility], [address], [place], [place] [address]      RADIOGRAPHIC STUDIES:  No recent pertinent imaging studies available to review.  No orders of the defined types were placed in this encounter.    Future Appointments  Date Time Provider Department Center  08/04/2023  2:10 PM Romayne Clubs, PA-C [facility] None  08/10/2023 10:20 AM Edmonia Gottron, PA-C [facility]  11/03/2023  2:30 PM [facility] None  11/03/2023  3:00 PM Merryl Buckels, Gale Jude, MD [facility] None  11/10/2023  1:40 PM Deveshwar, Clydie Darter, MD [facility] None    This document was completed utilizing speech recognition software. Grammatical errors, random word insertions, pronoun errors, and incomplete sentences are an occasional consequence of this system due to software limitations, ambient noise, and hardware issues. Any formal questions or concerns  about the content, text or information contained within the body of this dictation should be directly addressed to the provider for clarification.

## 2023-07-21 NOTE — Progress Notes (Unsigned)
 Office Visit Note  Patient: Mary [name]             Date of Birth: [birth year]           MRN: [id number]             PCP: Marce Sensing, NP Referring: Marce Sensing, NP Visit Date: 08/04/2023 Occupation: @GUAROCC @  Subjective:  Pain in both shoulders   History of Present Illness: Mary [name] is a 83 y.o. female with history of seronegative rheumatoid arthritis and gout. She is taking Arava  20 mg 1 tablet by mouth daily and plaquenil  200 mg 1 tablet by mouth daily M-F.  She is tolerating combination therapy without any side effects and has not had any recent gaps in therapy.  Patient states that she has had increased discomfort in both shoulder joints but states that as the weather has gotten warmer her symptoms have started to subside.  She denies any signs or symptoms of a gout flare. She is taking allopurinol  200 mg daily for management of gout.   She denies any recent or recurrent infections.   Activities of Daily Living:  Patient denies any morning stiffness  Patient Denies nocturnal pain.  Difficulty dressing/grooming: Reports Difficulty climbing stairs: Reports Difficulty getting out of chair: Reports Difficulty using hands for taps, buttons, cutlery, and/or writing: Reports  Review of Systems  Constitutional:  Positive for fatigue.  HENT:  Negative for mouth sores and mouth dryness.   Eyes:  Negative for dryness.  Respiratory:  Negative for shortness of breath.   Cardiovascular:  Negative for chest pain and palpitations.  Gastrointestinal:  Positive for constipation and diarrhea. Negative for blood in stool.  Endocrine: Negative for increased urination.  Genitourinary:  Negative for involuntary urination.  Musculoskeletal:  Positive for joint pain, joint pain, joint swelling, myalgias and myalgias. Negative for gait problem, muscle weakness, morning stiffness and muscle tenderness.  Skin:  Negative for color change, rash, hair loss and sensitivity to sunlight.   Allergic/Immunologic: Negative for susceptible to infections.  Neurological:  Negative for dizziness and headaches.  Hematological:  Negative for swollen glands.  Psychiatric/Behavioral:  Negative for depressed mood and sleep disturbance. The patient is not nervous/anxious.     PMFS History:  Patient Active Problem List   Diagnosis Date Noted   Normocytic anemia 07/08/2023   Wheelchair dependent 06/17/2022   Mild persistent asthma without complication 11/18/2017   Age-related osteoporosis without current pathological fracture 11/19/2016   Acute respiratory failure (HCC)    Rheumatoid lung (HCC)    Hypoxia 09/03/2016   Essential hypertension 09/03/2016   Urinary tract infection 09/03/2016   Rheumatoid arthritis of multiple sites with negative rheumatoid factor (HCC) 02/28/2016   High risk medication use 02/28/2016   Idiopathic chronic gout of multiple sites without tophus 02/28/2016   Primary osteoarthritis of both knees 02/28/2016   Contracture, joint, multiple sites 02/28/2016   History of hypothyroidism 02/28/2016   History of diabetes mellitus 02/28/2016   History of shingles 02/28/2016   History of hyperlipidemia 02/28/2016    Past Medical History:  Diagnosis Date   Anemia    Antritis (stomach)    Aortic aneurysm (HCC)    Complication of anesthesia    Diabetes mellitus without complication (HCC)    Dyspnea    Gout    Hiatal hernia    High cholesterol    History of kidney stones    Hypertension    Hypoxia 08/2016   Low back pain  Osteoarthritis    Osteopenia    PONV (postoperative nausea and vomiting)    Rheumatoid arthritis (HCC)     Family History  Problem Relation Age of Onset   Arthritis Brother    Arthritis Sister    Arthritis Sister    Arthritis Brother    Arthritis Brother    Arthritis Brother    Past Surgical History:  Procedure Laterality Date   ABDOMINAL HYSTERECTOMY     Social History   Social History Narrative   Not on file    Immunization History  Administered Date(s) Administered   Fluad Quad(high Dose 65+) 12/01/2018, 11/19/2021   Influenza, High Dose Seasonal PF 11/26/2015   Influenza-Unspecified 12/07/2016   PFIZER(Purple Top)SARS-COV-2 Vaccination 04/07/2019, 04/28/2019   Pneumococcal-Unspecified 09/04/2015     Objective: Vital Signs: BP (!) 153/81 (BP Location: Right Arm, Patient Position: Sitting, Cuff Size: Normal)   Pulse 88   Resp 16   Ht 5' (1.524 m)   BMI 29.29 kg/m    Physical Exam Vitals and nursing note reviewed.  Constitutional:      Appearance: She is well-developed.  HENT:     Head: Normocephalic and atraumatic.  Eyes:     Conjunctiva/sclera: Conjunctivae normal.  Cardiovascular:     Rate and Rhythm: Normal rate and regular rhythm.     Heart sounds: Normal heart sounds.  Pulmonary:     Effort: Pulmonary effort is normal.     Breath sounds: Normal breath sounds.  Abdominal:     General: Bowel sounds are normal.     Palpations: Abdomen is soft.  Musculoskeletal:     Cervical back: Normal range of motion.  Lymphadenopathy:     Cervical: No cervical adenopathy.  Skin:    General: Skin is warm and dry.     Capillary Refill: Capillary refill takes less than 2 seconds.  Neurological:     Mental Status: She is alert and oriented to person, place, and time.  Psychiatric:        Behavior: Behavior normal.      Musculoskeletal Exam: Patient remained seated in the wheelchair during the exam today.  C-spine has limited range of motion.  Thoracic kyphosis noted.  Severely limited range of motion of the shoulder joints with some tenderness upon palpation.  Limited extension of both elbow joints.  Synovial thickening of both wrist joints, right worse than left.  Severely limited range of motion of both wrist joints.  Synovial thickening of MCP joints with ulnar deviation and incomplete fist formation.  Limited extension and PIP joints.  Contractures of MCP and PIP joints.  Flexion  contractures of bilateral knees.  Subluxation and thickening of both ankle joints.  CDAI Exam: CDAI Score: -- Patient Global: --; Provider Global: -- Swollen: --; Tender: -- Joint Exam 08/04/2023   No joint exam has been documented for this visit   There is currently no information documented on the homunculus. Go to the Rheumatology activity and complete the homunculus joint exam.  Investigation: No additional findings.  Imaging: No results found.  Recent Labs: Lab Results  Component Value Date   WBC 8.8 07/07/2023   HGB 9.1 (L) 07/07/2023   PLT 293 07/07/2023   NA 139 07/07/2023   K 4.3 07/07/2023   CL 105 07/07/2023   CO2 23 07/07/2023   GLUCOSE 116 (H) 07/07/2023   BUN 31 (H) 07/07/2023   CREATININE 1.06 (H) 07/07/2023   BILITOT 0.2 07/07/2023   ALKPHOS 76 07/07/2023   AST 10 (L) 07/07/2023  ALT 6 07/07/2023   PROT 7.8 07/07/2023   ALBUMIN 4.1 07/07/2023   CALCIUM  9.4 07/07/2023   GFRAA 95 03/26/2020    Speciality Comments: Prior therapy: Plaquenil  (d/c per opthamologist recommendation) and SSZ (diarrhea) PLQ Eye Exam: 04/25/2023 Pt unable to do OCT/HVF due to mobility. Mac appeared normal on DFE, no PLQ toxicity noted.[facility] Follow up 6 months  Please scheudle patient's appts. every 3 month per patient request for lab compliance.    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  She is wheelchair-bound: Patient continues to experience intermittent arthralgias and joint stiffness especially in both shoulder joints.  She has started to notice her symptoms have been more tolerable with warmer weather temperatures.  Overall her rheumatoid arthritis remains stable on the current treatment regimen.  She is tolerating combination therapy without  any side effects.  She has not had any recent or recurrent infections.  She will notify us  when and if she would like to return for a shoulder injection in the future if needed. She will remain on Plaquenil  200 mg 1 tablet by mouth daily Monday through Friday and arava  20 mg 1 tablet by mouth daily.  She was advised to notify us  if she develops any new or worsening symptoms. She will follow up in 5 months or sooner if needed.    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 Mannam on November 19, 2021.  Unable to obtain PFTs in the past.  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  Crackles at bilateral lung bases.  No new or worsening pulmonary symptoms.  High risk medication use: Plaquenil  200 mg 1 tablet daily Monday through Friday and Arava  20 mg 1 tablet by mouth daily. CBC and CMP updated on 07/07/23.  Her next lab work will be due in August and every 3 months to monitor for drug toxicity. PLQ Eye Exam: 04/25/2023 Pt unable to do OCT/HVF due to mobility. Mac appeared normal on DFE, no PLQ toxicity noted.[facility] Follow up 6 months  No recent or recurrent infections.  Discussed the importance of holding Arava  if she develops signs or symptoms of an infection and to resume once the infection is completely cleared.  Idiopathic chronic gout of multiple sites without tophus - She has not had any signs or symptoms of a gout flare.  She has clinically been doing well taking allopurinol  200 mg daily for management of gout.  Primary osteoarthritis of right knee: Flexion contracture.  Warmth noted.  No effusion noted.  S/P TKR (total knee replacement), left: Limited extension.  No effusion noted.   Age-related osteoporosis without current pathological fracture - DEXA on 06/18/2020 Right femoral neck BMD 0.593 with T score -2.3.  Patient has declined to schedule updated bone density at this time.  Flexion contractures - End  stage rheumatoid arthritis. Flexion contracture ~10 degrees in both elbows.  Flexion contracture of both knees.  Patient is primarily wheelchair-bound.  Other medical conditions are listed as follows:  History of diabetes mellitus  History of kidney stones  History of hyperlipidemia  Essential hypertension: Blood pressure was elevated today in the office and was rechecked prior to leaving.  Patient was advised to monitor blood pressure closely and return to PCP if her  blood pressure remains elevated.  History of hypothyroidism  History of anemia  History of shingles  Orders: No orders of the defined types were placed in this encounter.  No orders of the defined types were placed in this encounter.   Follow-Up Instructions: Return in about 5 months (around 01/04/2024).   Romayne Clubs, PA-C  Note - This record has been created using Dragon software.  Chart creation errors have been sought, but may not always  have been located. Such creation errors do not reflect on  the standard of medical care.

## 2023-08-04 ENCOUNTER — Encounter: Payer: Self-pay | Admitting: Physician Assistant

## 2023-08-04 ENCOUNTER — Ambulatory Visit: Attending: Physician Assistant | Admitting: Physician Assistant

## 2023-08-04 VITALS — BP 153/81 | HR 88 | Resp 16 | Ht 60.0 in

## 2023-08-04 DIAGNOSIS — Z8619 Personal history of other infectious and parasitic diseases: Secondary | ICD-10-CM

## 2023-08-04 DIAGNOSIS — M1A09X0 Idiopathic chronic gout, multiple sites, without tophus (tophi): Secondary | ICD-10-CM | POA: Diagnosis not present

## 2023-08-04 DIAGNOSIS — M2450 Contracture, unspecified joint: Secondary | ICD-10-CM

## 2023-08-04 DIAGNOSIS — J849 Interstitial pulmonary disease, unspecified: Secondary | ICD-10-CM | POA: Diagnosis not present

## 2023-08-04 DIAGNOSIS — Z87442 Personal history of urinary calculi: Secondary | ICD-10-CM

## 2023-08-04 DIAGNOSIS — Z79899 Other long term (current) drug therapy: Secondary | ICD-10-CM | POA: Diagnosis not present

## 2023-08-04 DIAGNOSIS — M1711 Unilateral primary osteoarthritis, right knee: Secondary | ICD-10-CM

## 2023-08-04 DIAGNOSIS — I10 Essential (primary) hypertension: Secondary | ICD-10-CM

## 2023-08-04 DIAGNOSIS — Z96652 Presence of left artificial knee joint: Secondary | ICD-10-CM

## 2023-08-04 DIAGNOSIS — M0609 Rheumatoid arthritis without rheumatoid factor, multiple sites: Secondary | ICD-10-CM

## 2023-08-04 DIAGNOSIS — M810 Age-related osteoporosis without current pathological fracture: Secondary | ICD-10-CM

## 2023-08-04 DIAGNOSIS — Z862 Personal history of diseases of the blood and blood-forming organs and certain disorders involving the immune mechanism: Secondary | ICD-10-CM

## 2023-08-04 DIAGNOSIS — Z8639 Personal history of other endocrine, nutritional and metabolic disease: Secondary | ICD-10-CM

## 2023-08-04 NOTE — Patient Instructions (Signed)
 Standing Labs We placed an order today for your standing lab work.   Please have your standing labs drawn in August and every 3 months   Please have your labs drawn 2 weeks prior to your appointment so that the provider can discuss your lab results at your appointment, if possible.  Please note that you may see your imaging and lab results in MyChart before we have reviewed them. We will contact you once all results are reviewed. Please allow our office up to 72 hours to thoroughly review all of the results before contacting the office for clarification of your results.  WALK-IN LAB HOURS  Monday through Thursday from 8:00 am -12:30 pm and 1:00 pm-4:00 pm and Friday from 8:00 am-12:00 pm.  Patients with office visits requiring labs will be seen before walk-in labs.  You may encounter longer than normal wait times. Please allow additional time. Wait times may be shorter on  Monday and Thursday afternoons.  We do not book appointments for walk-in labs. We appreciate your patience and understanding with our staff.   Labs are drawn by [facility]. Please bring your co-pay at the time of your lab draw.  You may receive a bill from [facility] for your lab work.  Please note if you are on Hydroxychloroquine  and and an order has been placed for a Hydroxychloroquine  level,  you will need to have it drawn 4 hours or more after your last dose.  If you wish to have your labs drawn at another location, please call the office 24 hours in advance so we can fax the orders.  The office is located at [address], [address], [place], [place] [address]   If you have any questions regarding directions or hours of operation,  please call [phone].   As a reminder, please drink plenty of water prior to coming for your lab work. Thanks!

## 2023-08-09 NOTE — Progress Notes (Unsigned)
 08/09/2023 Mary [name] [id number] [birth year]  Referring provider: Marce Sensing, NP Primary GI doctor: Dr. General Kenner  ASSESSMENT AND PLAN:   Normocytic Anemia without overt bleeding  05/04/2023  HGB 8.8 (9.3)  MCV 91.7 Platelets 275 12/30/2022 Iron 50 Ferritin 184  Has been on and off iron, was restarted 3 weeks ago  Recent Labs    09/24/22 1410 12/30/22 1348 05/04/23 1503 05/19/23 1522 07/07/23 1557  HGB 9.0* 9.3* 8.8* 9.5* 9.1*   - I also have not seen an iron def from labs - will get iron, ferriitn, B12, folate, retic count - pending results we may stop her iron supplement and see where she is at - declines rectal and declines hemoccult cards High risk for endoscopic evaluation due to rheumatoid arthritis with lung involvement and wheelchair dependency, also after candid discussion with patient and daughter, she is not interested in endoscopic evaluation even if this means a cancer is undetected. -Patient to contact office if overt GI bleeding occurs for possible in-hospital endoscopy or colonoscopy. - follow up 3 months  Hiatal Hernia Moderate size, identified on CT chest in March. Mild reflux and early satiety. Diarrhea with pantroprazole - did well with omeprazole 40 mg daily, continue the same - can take pepcid twice a day -states she is feeling better, eating more  Constipation Likely secondary to decreased movement (wheelchair dependent) and medications. - has increased eating and states she is doing better  Follow-up in 3-4 months.   Patient Care Team: Marce Sensing, NP as PCP - General  HISTORY OF PRESENT ILLNESS: 83 y.o. 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    05/19/2022 CT chest high-resolution with history of interstitial lung disease showed  atherosclerosis of coronary arteries, moderate size hiatal hernia widespread patchy groundglass attenuation, septal thickening, subpleural reticulation, bronchiectasis scattered areas of honeycombing nonobstructing left kidney stone 1.8 cm right liver cyst likely benign no imaging recommended 09/24/2022 hemoglobin 9 compared to 10.2 three months prior, baseline appears to be around 10, MCV normocytic at 92 normal platelets no leukocytosis.  BUN 35, creatinine 1.12, liver function unremarkable  Discussed the use of AI scribe software for clinical note transcription with the patient, who gave verbal consent to proceed.  Patient in wheel chair dependent due to bilateral knees. She had a colonoscopy normal with Doctor in [place] 20 years ago, has not had another since that time. No family history of colon cancer.  She is not on oxygen.   She states she has always been anemic, had the colonoscopy years and years ago due to anemia.  She states that Dr. Georgiann Kirsch has told her that her anemia is related to her RA, she is now on iron once daily for the last month. Started by [employer].  She states occ with discomfort she does not want to eat. She states she can have nausea with certain foods like [place] only, no vomiting. Denies GERD. She does have metformin get "hung on right throat" she can drink water and it helps, not consistent. Otherwise denies dysphagia. She does get full quickly, has been worse the last several months.  Denies melena or hematochezia.  She has BM every other day.   She  reports that she has never smoked. She has been exposed to tobacco smoke. She has never used smokeless tobacco. She reports that she does not  drink alcohol and does not use drugs.  RELEVANT LABS AND IMAGING:  Results          CBC    Component Value Date/Time   WBC 8.8 07/07/2023 1557   WBC 7.8 05/19/2023 1522   RBC 3.22 (L) 07/07/2023 1557   HGB 9.1 (L) 07/07/2023 1557   HGB 9.8 (L) 04/10/2022 1148   HCT  29.1 (L) 07/07/2023 1557   HCT 31.2 (L) 04/10/2022 1148   PLT 293 07/07/2023 1557   PLT 191 04/10/2022 1148   MCV 90.4 07/07/2023 1557   MCV 89 04/10/2022 1148   MCH 28.3 07/07/2023 1557   MCHC 31.3 07/07/2023 1557   RDW 17.2 (H) 07/07/2023 1557   RDW 16.1 (H) 04/10/2022 1148   LYMPHSABS 1.5 07/07/2023 1557   LYMPHSABS 1.0 04/10/2022 1148   MONOABS 0.6 07/07/2023 1557   EOSABS 0.8 (H) 07/07/2023 1557   EOSABS 0.4 04/10/2022 1148   BASOSABS 0.1 07/07/2023 1557   BASOSABS 0.1 04/10/2022 1148   Recent Labs    09/24/22 1410 12/30/22 1348 05/04/23 1503 05/19/23 1522 07/07/23 1557  HGB 9.0* 9.3* 8.8* 9.5* 9.1*    CMP     Component Value Date/Time   NA 139 07/07/2023 1557   NA 138 04/10/2022 1148   K 4.3 07/07/2023 1557   CL 105 07/07/2023 1557   CO2 23 07/07/2023 1557   GLUCOSE 116 (H) 07/07/2023 1557   BUN 31 (H) 07/07/2023 1557   BUN 24 04/10/2022 1148   CREATININE 1.06 (H) 07/07/2023 1557   CREATININE 1.00 (H) 05/04/2023 1503   CALCIUM  9.4 07/07/2023 1557   PROT 7.8 07/07/2023 1557   PROT 6.7 04/10/2022 1148   ALBUMIN 4.1 07/07/2023 1557   ALBUMIN 4.0 04/10/2022 1148   AST 10 (L) 07/07/2023 1557   ALT 6 07/07/2023 1557   ALKPHOS 76 07/07/2023 1557   BILITOT 0.2 07/07/2023 1557   GFRNONAA 53 (L) 07/07/2023 1557   GFRNONAA 84 07/04/2019 1209   GFRAA 95 03/26/2020 0851   GFRAA 97 07/04/2019 1209      Latest Ref Rng & Units 07/07/2023    3:57 PM 05/19/2023    3:22 PM 05/04/2023    3:03 PM  Hepatic Function  Total Protein 6.5 - 8.1 g/dL 7.8  8.0  6.9   Albumin 3.5 - 5.0 g/dL 4.1  4.0    AST 15 - 41 U/L 10  9  9    ALT 0 - 44 U/L 6  5  6    Alk Phosphatase 38 - 126 U/L 76  87    Total Bilirubin 0.0 - 1.2 mg/dL 0.2  0.3  0.2       Current Medications:   Current Outpatient Medications (Endocrine & Metabolic):    levothyroxine  (SYNTHROID ) 100 MCG tablet, Take 100 mcg by mouth daily.   metFORMIN (GLUCOPHAGE-XR) 500 MG 24 hr tablet, Take 500 mg by mouth  daily.  Current Outpatient Medications (Cardiovascular):    amLODipine (NORVASC) 2.5 MG tablet, Take 2.5 mg by mouth daily.   atorvastatin  (LIPITOR) 20 MG tablet, SMARTSIG:1 Tablet(s) By Mouth Every Evening   benazepril  (LOTENSIN ) 20 MG tablet, Take 1 tablet (20 mg total) by mouth daily. (Patient not taking: Reported on 08/04/2023)   benazepril  (LOTENSIN ) 40 MG tablet, Take 40 mg by mouth daily.   carvedilol  (COREG ) 6.25 MG tablet,   Current Outpatient Medications (Respiratory):    albuterol  (PROVENTIL ) (2.5 MG/3ML) 0.083% nebulizer solution, Take 3 mLs (2.5 mg total) by nebulization every 6 (six)  hours as needed for wheezing or shortness of breath.   ipratropium-albuterol  (DUONEB) 0.5-2.5 (3) MG/3ML SOLN, Take 3 mLs by nebulization 4 (four) times daily as needed.  Current Outpatient Medications (Analgesics):    acetaminophen  (TYLENOL ) 500 MG tablet, as needed.    allopurinol  (ZYLOPRIM ) 100 MG tablet, Take 200 mg by mouth daily.    leflunomide  (ARAVA ) 20 MG tablet, TAKE ONE TABLET BY MOUTH EVERY DAY  Current Outpatient Medications (Hematological):    FEROSUL 325 (65 Fe) MG tablet, Take 325 mg by mouth daily.   folic acid  (FOLVITE ) 1 MG tablet, Take 2 mg by mouth daily.  Current Outpatient Medications (Other):    Cholecalciferol  (VITAMIN D3) 5000 units TABS, Take 1 tablet by mouth. Takes on Monday, Wednesday, and Friday.   clorazepate  (TRANXENE ) 7.5 MG tablet, Take 7.5 mg by mouth 2 (two) times daily.    hydroxychloroquine  (PLAQUENIL ) 200 MG tablet, Take 1 tablet by mouth daily Monday-Fridays only.   magnesium oxide (MAG-OX) 400 MG tablet, Take 1 tablet by mouth daily.   omeprazole (PRILOSEC) 40 MG capsule, Take 40 mg by mouth daily.   Polyethylene Glycol 3350  (MIRALAX  PO), Take by mouth. (Patient not taking: Reported on 08/04/2023)  Medical History:  Past Medical History:  Diagnosis Date   Anemia    Antritis (stomach)    Aortic aneurysm (HCC)    Complication of anesthesia    Diabetes  mellitus without complication (HCC)    Dyspnea    Gout    Hiatal hernia    High cholesterol    History of kidney stones    Hypertension    Hypoxia 08/2016   Low back pain    Osteoarthritis    Osteopenia    PONV (postoperative nausea and vomiting)    Rheumatoid arthritis (HCC)    Allergies:  Allergies  Allergen Reactions   Aspirin Other (See Comments)    Itching per pt   Codeine Nausea Only     Surgical History:  She  has a past surgical history that includes Abdominal hysterectomy. Family History:  Her family history includes Arthritis in her brother, brother, brother, brother, sister, and sister.  REVIEW OF SYSTEMS  : All other systems reviewed and negative except where noted in the History of Present Illness.  PHYSICAL EXAM: There were no vitals taken for this visit. General Appearance: chronically ill appearing female, NAD Head:   Normocephalic and atraumatic. Eyes:  sclerae anicteric,conjunctive pink  Respiratory: Respiratory effort normal, decreased diffuse breath sounds with rhonchi Cardio: RRR with no MRGs. Peripheral pulses intact.  Abdomen: Soft,  Obese ,active bowel sounds. No tenderness . No masses. Rectal: declines Musculoskeletal: Full ROM, Not tested gait. With edema. Patient with mild contraction bilateral hands, with ulnar deviation, swelling Skin:  Dry and intact without significant lesions or rashes Neuro: Alert and  oriented x4;  No focal deficits. Psych:  Cooperative. Normal mood and affect.    Edmonia Gottron, PA-C 12:23 PM

## 2023-08-10 ENCOUNTER — Encounter: Payer: Self-pay | Admitting: Physician Assistant

## 2023-08-10 ENCOUNTER — Ambulatory Visit: Admitting: Physician Assistant

## 2023-08-10 VITALS — BP 144/88 | HR 84 | Ht 64.0 in | Wt 160.0 lb

## 2023-08-10 DIAGNOSIS — K5900 Constipation, unspecified: Secondary | ICD-10-CM | POA: Diagnosis not present

## 2023-08-10 DIAGNOSIS — D649 Anemia, unspecified: Secondary | ICD-10-CM

## 2023-08-10 DIAGNOSIS — M0609 Rheumatoid arthritis without rheumatoid factor, multiple sites: Secondary | ICD-10-CM

## 2023-08-10 DIAGNOSIS — D509 Iron deficiency anemia, unspecified: Secondary | ICD-10-CM

## 2023-08-10 DIAGNOSIS — K449 Diaphragmatic hernia without obstruction or gangrene: Secondary | ICD-10-CM

## 2023-08-10 DIAGNOSIS — K219 Gastro-esophageal reflux disease without esophagitis: Secondary | ICD-10-CM

## 2023-08-10 DIAGNOSIS — R197 Diarrhea, unspecified: Secondary | ICD-10-CM

## 2023-08-10 DIAGNOSIS — M0510 Rheumatoid lung disease with rheumatoid arthritis of unspecified site: Secondary | ICD-10-CM

## 2023-08-10 DIAGNOSIS — R6881 Early satiety: Secondary | ICD-10-CM

## 2023-08-10 DIAGNOSIS — K5904 Chronic idiopathic constipation: Secondary | ICD-10-CM

## 2023-08-10 DIAGNOSIS — E538 Deficiency of other specified B group vitamins: Secondary | ICD-10-CM

## 2023-08-10 NOTE — Patient Instructions (Signed)
 Follow with hematology Continue oral iron every other day Call us  if you have any dark black stools or blood in the stool or go to the ER Stay on the omeprazole 40 mg daily  Miralax  is an osmotic laxative.  It only brings more water into the stool.  This is safe to take daily.  Can take up to 17 gram of miralax  twice a day.  Mix with juice or coffee.  Start 1/2 daily for 3-4 days and reassess your response in 3-4 days.  You can increase and decrease the dose based on your response.  Remember, it can take up to 3-4 days to take effect OR for the effects to wear off.   I often pair this with benefiber in the morning to help assure the stool is not too loose.    Gastroparesis Please do small frequent meals like 4-6 meals a day.  Eat and drink liquids at separate times.  Avoid high fiber foods, cook your vegetables, avoid high fat food.  Suggest spreading protein throughout the day (greek yogurt, glucerna, soft meat, milk, eggs) Choose soft foods that you can mash with a fork When you are more symptomatic, change to pureed foods foods and liquids.  Consider reading "Living well with Gastroparesis" by Creasie Doctor Gastroparesis is a condition in which food takes longer than normal to empty from the stomach. This condition is also known as delayed gastric emptying. It is usually a long-term (chronic) condition. There is no cure, but there are treatments and things that you can do at home to help relieve symptoms. Treating the underlying condition that causes gastroparesis can also help relieve symptoms What are the causes? In many cases, the cause of this condition is not known. Possible causes include: A hormone (endocrine) disorder, such as hypothyroidism or diabetes. A nervous system disease, such as Parkinson's disease or multiple sclerosis. Cancer, infection, or surgery that affects the stomach or vagus nerve. The vagus nerve runs from your chest, through your neck, and to the  lower part of your brain. A connective tissue disorder, such as scleroderma. Certain medicines. What increases the risk? You are more likely to develop this condition if: You have certain disorders or diseases. These may include: An endocrine disorder. An eating disorder. Amyloidosis. Scleroderma. Parkinson's disease. Multiple sclerosis. Cancer or infection of the stomach or the vagus nerve. You have had surgery on your stomach or vagus nerve. You take certain medicines. You are female. What are the signs or symptoms? Symptoms of this condition include: Feeling full after eating very little or a loss of appetite. Nausea, vomiting, or heartburn. Bloating of your abdomen. Inconsistent blood sugar (glucose) levels on blood tests. Unexplained weight loss. Acid from the stomach coming up into the esophagus (gastroesophageal reflux). Sudden tightening (spasm) of the stomach, which can be painful. Symptoms may come and go. Some people may not notice any symptoms.

## 2023-08-10 NOTE — Progress Notes (Signed)
 Agree with assessment and plan as outlined.

## 2023-08-12 ENCOUNTER — Other Ambulatory Visit: Payer: Self-pay | Admitting: Physician Assistant

## 2023-08-31 ENCOUNTER — Other Ambulatory Visit: Payer: Self-pay | Admitting: Physician Assistant

## 2023-08-31 NOTE — Telephone Encounter (Signed)
 Last Fill: 06/01/2023  Labs: 07/07/2023 RBC 3.22 Hemoglobin 9.1 HCT 29.1 RDW 17.2 Eosinophils Absolute 0.8 Glucose 116 BUN 31 Creatinine 1.06 AST 10 GFR 53  Next Visit: 01/04/2024  Last Visit: 08/04/2023  DX: Rheumatoid arthritis of multiple sites with negative rheumatoid factor (HCC)   Current Dose per office note 08/04/2023: Arava  20 mg 1 tablet by mouth daily   Okay to refill Arava  ?

## 2023-09-20 MED ORDER — HYDROXYCHLOROQUINE SULFATE 200 MG PO TABS: ORAL_TABLET | ORAL | 0 refills | Status: DC

## 2023-09-20 NOTE — Telephone Encounter (Signed)
 Last Fill: 06/14/2023  Eye exam: 04/25/2023 Pt unable to do OCT/HVF due to mobility. Mac appeared normal on DFE, no PLQ toxicity noted.   Labs: 07/07/2023 Glucose 116, BUN 31, Creat. 1.06, GFR 53, AST 10, RBC 3.22, Hgb 9.1, Hct 29.1, RDW 17.2, Eosinophils Absolute 0.8  Next Visit: 01/04/2024  Last Visit: 08/04/2023  IK:Myzlfjunpi arthritis of multiple sites with negative rheumatoid factor   Current Dose per office note 08/04/2023: Plaquenil  200 mg 1 tablet daily Monday through Friday   Okay to refill Plaquenil ?

## 2023-10-04 ENCOUNTER — Other Ambulatory Visit: Payer: Self-pay

## 2023-10-04 DIAGNOSIS — I723 Aneurysm of iliac artery: Secondary | ICD-10-CM

## 2023-10-05 NOTE — Addendum Note (Signed)
 Addended by: RAYNA MOATS A on: 10/05/2023 01:14 PM   Modules accepted: Orders

## 2023-10-14 ENCOUNTER — Ambulatory Visit (HOSPITAL_COMMUNITY)
Admission: RE | Admit: 2023-10-14 | Discharge: 2023-10-14 | Disposition: A | Discharge status: Home / Self Care | Source: Ambulatory Visit | Attending: Surgery | Admitting: Surgery

## 2023-10-14 DIAGNOSIS — I723 Aneurysm of iliac artery: Secondary | ICD-10-CM | POA: Diagnosis present

## 2023-10-14 MED ORDER — IOHEXOL 350 MG/ML SOLN
100.0000 mL | Freq: Once | INTRAVENOUS | Status: AC | PRN
  Administered 2023-10-14: 100 mL via INTRAVENOUS

## 2023-10-14 MED ORDER — SODIUM CHLORIDE (PF) 0.9 % IJ SOLN
INTRAMUSCULAR | Status: AC
Start: 2023-10-14 — End: 2023-10-14
  Filled 2023-10-14: qty 50

## 2023-11-03 ENCOUNTER — Inpatient Hospital Stay: Admitting: Oncology

## 2023-11-03 ENCOUNTER — Other Ambulatory Visit: Payer: Self-pay | Admitting: Oncology

## 2023-11-03 ENCOUNTER — Other Ambulatory Visit: Payer: Self-pay

## 2023-11-03 ENCOUNTER — Encounter: Payer: Self-pay | Admitting: Oncology

## 2023-11-03 ENCOUNTER — Inpatient Hospital Stay: Attending: Oncology

## 2023-11-03 VITALS — BP 145/84 | HR 83 | Temp 97.8°F | Resp 16 | Ht 64.0 in

## 2023-11-03 DIAGNOSIS — M0609 Rheumatoid arthritis without rheumatoid factor, multiple sites: Secondary | ICD-10-CM | POA: Diagnosis not present

## 2023-11-03 DIAGNOSIS — Z79899 Other long term (current) drug therapy: Secondary | ICD-10-CM | POA: Diagnosis not present

## 2023-11-03 DIAGNOSIS — D649 Anemia, unspecified: Secondary | ICD-10-CM

## 2023-11-03 DIAGNOSIS — M069 Rheumatoid arthritis, unspecified: Secondary | ICD-10-CM | POA: Insufficient documentation

## 2023-11-03 LAB — CMP (CANCER CENTER ONLY)
ALT: 5 U/L (ref 0–44)
AST: 9 U/L — ABNORMAL LOW (ref 15–41)
Albumin: 3.7 g/dL (ref 3.5–5.0)
Alkaline Phosphatase: 76 U/L (ref 38–126)
Anion gap: 9 (ref 5–15)
BUN: 27 mg/dL — ABNORMAL HIGH (ref 8–23)
CO2: 21 mmol/L — ABNORMAL LOW (ref 22–32)
Calcium: 8.9 mg/dL (ref 8.9–10.3)
Chloride: 109 mmol/L (ref 98–111)
Creatinine: 0.94 mg/dL (ref 0.44–1.00)
GFR, Estimated: >60 mL/min (ref >60)
Glucose, Bld: 120 mg/dL — ABNORMAL HIGH (ref 70–99)
Potassium: 4.1 mmol/L (ref 3.5–5.1)
Sodium: 139 mmol/L (ref 135–145)
Total Bilirubin: 0.2 mg/dL (ref 0.0–1.2)
Total Protein: 7.6 g/dL (ref 6.5–8.1)

## 2023-11-03 LAB — CBC WITH DIFFERENTIAL (CANCER CENTER ONLY)
Abs Immature Granulocytes: 0.01 K/uL (ref 0.00–0.07)
Basophils Absolute: 0.1 K/uL (ref 0.0–0.1)
Basophils Relative: 1 %
Eosinophils Absolute: 0.7 K/uL — ABNORMAL HIGH (ref 0.0–0.5)
Eosinophils Relative: 10 %
HCT: 29.6 % — ABNORMAL LOW (ref 36.0–46.0)
Hemoglobin: 9.1 g/dL — ABNORMAL LOW (ref 12.0–15.0)
Immature Granulocytes: 0 %
Lymphocytes Relative: 20 %
Lymphs Abs: 1.4 K/uL (ref 0.7–4.0)
MCH: 28.3 pg (ref 26.0–34.0)
MCHC: 30.7 g/dL (ref 30.0–36.0)
MCV: 91.9 fL (ref 80.0–100.0)
Monocytes Absolute: 0.7 K/uL (ref 0.1–1.0)
Monocytes Relative: 9 %
Neutro Abs: 4.3 K/uL (ref 1.7–7.7)
Neutrophils Relative %: 60 %
Platelet Count: 267 K/uL (ref 150–400)
RBC: 3.22 MIL/uL — ABNORMAL LOW (ref 3.87–5.11)
RDW: 18.2 % — ABNORMAL HIGH (ref 11.5–15.5)
WBC Count: 7.2 K/uL (ref 4.0–10.5)
nRBC: 0 % (ref 0.0–0.2)

## 2023-11-03 LAB — FERRITIN: Ferritin: 235 ng/mL (ref 11–307)

## 2023-11-03 LAB — IRON AND IRON BINDING CAPACITY (CC-WL,HP ONLY)
Iron: 30 ug/dL (ref 28–170)
Saturation Ratios: 11 % (ref 10.4–31.8)
TIBC: 263 ug/dL (ref 250–450)
UIBC: 233 ug/dL (ref 148–442)

## 2023-11-03 NOTE — Assessment & Plan Note (Addendum)
 Chronic anemia with hemoglobin in the range of 8-10, fluctuating over 6-7 years. 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    Recent iron labs in March 2025 showed slightly low iron levels; she is on prescription iron supplements.    No acute blood loss or significant symptoms.   On initial consultation with us  on 07/07/2023, labs showed stable hemoglobin of 9.1, hematocrit 28.1, MCV 90.4.  White count 8800 with normal differential.  Platelet count normal.  Creatinine 1.06.  Iron studies showed no evidence of iron deficiency.  Ferritin, B12, folate, methylmalonic acid, LDH, haptoglobin were all unremarkable.  Coombs test negative.   Repeat labs today showed stable hemoglobin of 9.1, MCV 91.9.  White count and platelet count are within normal limits.  Iron studies are unremarkable.  CMP grossly unremarkable.   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   Continue current iron supplementation.  I do not see need for IV iron at this time.  She does get labs checked at her PCPs office every 3 months;  consider adjustments if hemoglobin falls below 8 g/dL or if symptomatic anemia develops.  I will plan to see her in 1 year for follow-up.

## 2023-11-03 NOTE — Assessment & Plan Note (Signed)
 Rheumatoid arthritis for over 40 years, managed with leflunomide  and Plaquenil . Potential contribution to anemia through chronic inflammation and medication side effects.

## 2023-11-03 NOTE — Progress Notes (Signed)
 [name]  HEMATOLOGY CLINIC PROGRESS NOTE  PATIENT NAME: Mary [name]   MR#: [id number] DOB: Aug 26, 1940  Patient Care Team: Silvano Angeline FALCON, NP as PCP - General  Date of visit: 11/03/2023   ASSESSMENT & PLAN:   Mary [name] is a 83 y.o. very pleasant lady with a past medical history of rheumatoid arthritis on Plaquenil  and leflunomide , hypertension, osteopenia, hypothyroidism, diabetes mellitus, hiatal hernia, was referred to our service in May 2025 for evaluation of normocytic anemia.  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    Normocytic anemia Chronic anemia with hemoglobin in the range of 8-10, fluctuating over 6-7 years. 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    Recent iron labs in March 2025 showed slightly low iron levels; she is on prescription iron supplements.    No acute blood loss or significant symptoms.   On initial consultation with us  on 07/07/2023, labs showed stable hemoglobin of 9.1, hematocrit 28.1, MCV 90.4.  White count 8800 with normal differential.  Platelet count normal.  Creatinine 1.06.  Iron studies showed no evidence of iron deficiency.  Ferritin, B12, folate, methylmalonic acid, LDH, haptoglobin were all unremarkable.  Coombs test negative.   Repeat labs today showed stable hemoglobin of 9.1, MCV 91.9.  White count and platelet count are within normal limits.  Iron studies are unremarkable.  CMP grossly unremarkable.   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   Continue current iron supplementation.  I do not see need for IV iron at this time.  She does get labs checked at her PCPs office every 3 months;  consider adjustments if hemoglobin falls below 8 g/dL or if symptomatic anemia develops.  I will  plan to see her in 1 year for follow-up.  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 Potential contribution to anemia through chronic inflammation and medication side effects.   I spent a total of 20 minutes during this encounter with the patient including review of chart and various tests results, discussions about plan of care and coordination of care plan.  I reviewed lab results and outside records for this visit and discussed relevant results with the patient. Diagnosis, plan of care and treatment options were also discussed in detail with the patient. Opportunity provided to ask questions and answers provided to her apparent satisfaction. Provided instructions to call our clinic with any problems, questions or concerns prior to return visit. I recommended to continue follow-up with PCP and sub-specialists. She verbalized understanding and agreed with the plan. No barriers to learning was detected.  Chinita Patten, MD  11/03/2023 4:54 PM  [name] - A DEPT OF [facility] [address] [place] [address] Dept: [phone] Dept Fax: [phone]   CHIEF COMPLAINT/ REASON FOR VISIT:  Follow-up for chronic normocytic anemia, likely medication induced versus anemia of chronic disease from rheumatoid arthritis.  INTERVAL HISTORY:  Discussed the use of AI scribe software for clinical note transcription with the patient, who gave verbal consent to proceed.  History of Present Illness Mary [name] is an 83 year old female with anemia and rheumatoid arthritis who presents for a follow-up visit.  She manages her anemia with daily iron supplementation and  vitamin C, which she finds effective. Her most recent hemoglobin level was 9.1 g/dL, similar to her previous measurement. No symptoms of blood loss such as  blood in stool, epistaxis, or gum bleeds are present.  She has a history of a hiatal hernia and reports watching what she eats to avoid symptoms. She reports watching her food intake and has not noticed any significant weight loss.  Her rheumatoid arthritis is managed with Plaquenil , and she continues to take folic acid .    SUMMARY OF HEMATOLOGIC HISTORY:  She was referred by her GI doctor for evaluation of anemia.   On 05/19/2023, labs at her gastroenterologist office showed hemoglobin of 9.5, hematocrit 29.7, MCV 92.2.  White count 5800 with normal differential, platelet count 307,000.  Iron was decreased at 40.  B12 normal at 286.  Folate normal.  She was referred to us  for further evaluation of anemia.  On review of records, she has had fluctuating anemia at least since 2018 with hemoglobin in the range of 8.8-11.3.   No active bleeding, such as epistaxis or gum bleeds, and no significant weight loss or decreased appetite, although she experiences fullness and pain due to a small hernia, which affects her eating habits. She also experiences nausea when eating.   She has been on iron supplements, prescribed by her GI doctor, and takes them every other day due to constipation. She has been on this regimen for the past five to six weeks. Her recent blood work at her primary care doctor's office showed improvement, although specific values were not provided to her.   She has a long-standing history of rheumatoid arthritis, managed with leflunomide  (Arava ) and hydroxychloroquine  (Plaquenil ). She has been on treatment for over forty years. Her arthritis doctor has indicated that her medications could contribute to her anemia.   She also has a history of gout, which influences her dietary choices, such as avoiding beef liver despite her preference for it.   Chronic anemia with hemoglobin in the range of 8-10, fluctuating over 6-7 years. 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    Recent iron labs in March 2025 showed slightly low iron levels; she is on prescription iron supplements.    No acute blood loss or significant symptoms.   On initial consultation with us  on 07/07/2023, labs showed stable hemoglobin of 9.1, hematocrit 28.1, MCV 90.4.  White count 8800 with normal differential.  Platelet count normal.  Creatinine 1.06.  Iron studies showed no evidence of iron deficiency.  Ferritin, B12, folate, methylmalonic acid, LDH, haptoglobin were all unremarkable.  Coombs test negative.    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   Continue current iron supplementation.  I do not see need for IV iron at this time.  I have reviewed the past medical history, past surgical history, social history and family history with the patient and they are unchanged from previous note.  ALLERGIES: She is allergic to aspirin and codeine.  MEDICATIONS:  Current Outpatient Medications  Medication Sig Dispense Refill   acetaminophen  (TYLENOL ) 500 MG tablet as needed.      albuterol  (PROVENTIL ) (2.5 MG/3ML) 0.083% nebulizer solution Take 3 mLs (2.5 mg total) by nebulization every 6 (six) hours as needed for wheezing or shortness of breath. 360 mL 5   allopurinol  (ZYLOPRIM ) 100 MG tablet Take 200 mg by mouth daily.  amLODipine (NORVASC) 2.5 MG tablet Take 2.5 mg by mouth daily.     atorvastatin  (LIPITOR) 20 MG tablet SMARTSIG:1 Tablet(s) By Mouth Every Evening     benazepril  (LOTENSIN ) 20 MG tablet Take 1 tablet (20 mg total) by mouth daily. 31 tablet 0   benazepril  (LOTENSIN ) 40 MG tablet Take 40 mg by mouth daily.     carvedilol  (COREG ) 6.25 MG tablet      Cholecalciferol  (VITAMIN D3) 5000 units TABS Take 1 tablet by mouth. Takes on Monday, Wednesday, and Friday.     clorazepate  (TRANXENE ) 7.5 MG tablet Take 7.5 mg by mouth  2 (two) times daily.      FEROSUL 325 (65 Fe) MG tablet Take 325 mg by mouth daily.     folic acid  (FOLVITE ) 1 MG tablet Take 2 mg by mouth daily.     hydroxychloroquine  (PLAQUENIL ) 200 MG tablet Take 1 tablet by mouth daily Monday-Fridays only. 60 tablet 0   ipratropium-albuterol  (DUONEB) 0.5-2.5 (3) MG/3ML SOLN Take 3 mLs by nebulization 4 (four) times daily as needed. 360 mL 11   leflunomide  (ARAVA ) 20 MG tablet TAKE ONE (1) TABLET BY MOUTH EVERY DAY 90 tablet 0   levothyroxine  (SYNTHROID ) 100 MCG tablet Take 100 mcg by mouth daily.     magnesium oxide (MAG-OX) 400 MG tablet Take 1 tablet by mouth daily.     metFORMIN (GLUCOPHAGE-XR) 500 MG 24 hr tablet Take 500 mg by mouth daily.     omeprazole (PRILOSEC) 40 MG capsule Take 40 mg by mouth daily.     Polyethylene Glycol 3350  (MIRALAX  PO) Take by mouth.     No current facility-administered medications for this visit.     REVIEW OF SYSTEMS:    Review of Systems - Oncology  All other pertinent systems were reviewed with the patient and are negative.  PHYSICAL EXAMINATION:    Onc Performance Status - 11/03/23 1521       ECOG Perf Status   ECOG Perf Status Ambulatory and capable of all selfcare but unable to carry out any work activities.  Up and about more than 50% of waking hours      KPS SCALE   KPS % SCORE Cares for self, unable to carry on normal activity or to do active work          Vitals:   11/03/23 1515  BP: (!) 145/84  Pulse: 83  Resp: 16  Temp: 97.8 F (36.6 C)  SpO2: 94%   Filed Weights    Physical Exam Constitutional:      General: She is not in acute distress.    Appearance: Normal appearance.  HENT:     Head: Normocephalic and atraumatic.  Cardiovascular:     Rate and Rhythm: Normal rate.  Pulmonary:     Effort: Pulmonary effort is normal. No respiratory distress.  Abdominal:     General: There is no distension.  Neurological:     General: No focal deficit present.     Mental Status: She is  alert and oriented to person, place, and time.  Psychiatric:        Mood and Affect: Mood normal.        Behavior: Behavior normal.     LABORATORY DATA:   I have reviewed the data as listed.  Results for orders placed or performed in visit on 11/03/23  CBC with Differential ([name] Only)  Result Value Ref Range   WBC Count 7.2 4.0 - 10.5 K/uL   RBC  3.22 (L) 3.87 - 5.11 MIL/uL   Hemoglobin 9.1 (L) 12.0 - 15.0 g/dL   HCT 70.3 (L) 63.9 - 53.9 %   MCV 91.9 80.0 - 100.0 fL   MCH 28.3 26.0 - 34.0 pg   MCHC 30.7 30.0 - 36.0 g/dL   RDW 81.7 (H) 88.4 - 84.4 %   Platelet Count 267 150 - 400 K/uL   nRBC 0.0 0.0 - 0.2 %   Neutrophils Relative % 60 %   Neutro Abs 4.3 1.7 - 7.7 K/uL   Lymphocytes Relative 20 %   Lymphs Abs 1.4 0.7 - 4.0 K/uL   Monocytes Relative 9 %   Monocytes Absolute 0.7 0.1 - 1.0 K/uL   Eosinophils Relative 10 %   Eosinophils Absolute 0.7 (H) 0.0 - 0.5 K/uL   Basophils Relative 1 %   Basophils Absolute 0.1 0.0 - 0.1 K/uL   Immature Granulocytes 0 %   Abs Immature Granulocytes 0.01 0.00 - 0.07 K/uL  CMP ([name] only)  Result Value Ref Range   Sodium 139 135 - 145 mmol/L   Potassium 4.1 3.5 - 5.1 mmol/L   Chloride 109 98 - 111 mmol/L   CO2 21 (L) 22 - 32 mmol/L   Glucose, Bld 120 (H) 70 - 99 mg/dL   BUN 27 (H) 8 - 23 mg/dL   Creatinine 9.05 9.55 - 1.00 mg/dL   Calcium  8.9 8.9 - 10.3 mg/dL   Total Protein 7.6 6.5 - 8.1 g/dL   Albumin 3.7 3.5 - 5.0 g/dL   AST 9 (L) 15 - 41 U/L   ALT 5 0 - 44 U/L   Alkaline Phosphatase 76 38 - 126 U/L   Total Bilirubin 0.2 0.0 - 1.2 mg/dL   GFR, Estimated >39 >39 mL/min   Anion gap 9 5 - 15  Iron and Iron Binding Capacity ([facility] only)  Result Value Ref Range   Iron 30 28 - 170 ug/dL   TIBC 736 749 - 549 ug/dL   Saturation Ratios 11 10.4 - 31.8 %   UIBC 233 148 - 442 ug/dL    RADIOGRAPHIC STUDIES:  I have personally reviewed the radiological images as listed and agree with the findings in the  report.  CT ANGIO ABDOMEN PELVIS  W & WO CONTRAST Result Date: 10/19/2023 CLINICAL DATA:  Aneurysm of the internal iliac artery EXAM: CTA ABDOMEN AND PELVIS WITHOUT AND WITH CONTRAST TECHNIQUE: Multidetector CT imaging of the abdomen and pelvis was performed using the standard protocol during bolus administration of intravenous contrast. Multiplanar reconstructed images and MIPs were obtained and reviewed to evaluate the vascular anatomy. RADIATION DOSE REDUCTION: This exam was performed according to the departmental dose-optimization program which includes automated exposure control, adjustment of the mA and/or kV according to patient size and/or use of iterative reconstruction technique. CONTRAST:  OMNIPAQUE  IOHEXOL  350 MG/ML SOLN COMPARISON:  None Available. FINDINGS: VASCULAR Aorta: The abdominal aorta is ectatic and demonstrates mild exophytic irregular plaque in the infrarenal segment which results in a stenosis of less than 50%. No abdominal aortic aneurysm. Celiac: Patent. SMA: Patent. Renals: Mild atherosclerotic changes bilaterally, patent. IMA: Patent. Inflow: The right common iliac artery demonstrates mild diffuse atherosclerotic change and is ectatic. The distal segment is estimated at 1.6 cm in diameter. The right internal iliac artery is ectatic and demonstrates mild atherosclerotic changes. The right internal iliac artery is estimated at 1.1 cm proximally. The right external iliac artery is ectatic and patent. The left common iliac artery is ectatic  and demonstrates mild diffuse atherosclerotic changes. Maximal diameter is estimated at 1.6 cm. The left internal iliac artery is dilated in the distal segment estimated at 1.6 cm and maximal diameter on image 93 of CT series 5. The left external iliac artery is ectatic and patent. Proximal Outflow: The right common femoral artery is patent. The left common femoral artery is patent. Veins: No obvious venous abnormality within the limitations of  this arterial phase study. Review of the MIP images confirms the above findings. NON-VASCULAR Lower chest: Bibasilar scarring. Hepatobiliary: The right posterior liver cyst is present measuring 1.5 cm. Gallbladder is grossly unremarkable. Pancreas: Within normal limits. Spleen: Within normal limits. Adrenals/Urinary Tract: The adrenal glands are within normal limits. There is cortical thinning in both kidneys. There is mild dilation of the proximal left renal collecting system of uncertain significance which may be physiologic. A small right lower pole renal cyst is present which is too small for definitive characterization. Urinary bladder is grossly unremarkable. Stomach/Bowel: Small hiatal hernia. No dilated loops of bowel are appreciated. Lymphatic: No lymphadenopathy. Reproductive: Status post hysterectomy. No adnexal masses. Other: Nothing significant. Musculoskeletal: Degenerative changes with underlying osteopenia. Left convex lumbar curvature. IMPRESSION: 1. 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 2. 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 Electronically Signed   By: Maude Naegeli M.D.   On: 10/19/2023 09:52    Orders Placed This Encounter  Procedures   CBC with Differential ([name])    Standing Status:   Future    Expiration Date:   11/02/2024   Iron and Iron Binding Capacity ([facility] only)    Standing Status:   Future    Expiration Date:   11/02/2024   Ferritin    Standing Status:   Future    Expiration Date:   11/02/2024     Future Appointments  Date Time Provider Department [name]  11/08/2023  2:20 PM Serene Gaile ORN, MD [facility]  01/04/2024  1:50 PM Cheryl Waddell HERO, PA-C [facility] None  11/08/2024  2:30 PM [facility] None  11/08/2024  3:00 PM Serin Thornell, Chinita, MD [facility] None     This document was completed utilizing speech recognition software.  Grammatical errors, random word insertions, pronoun errors, and incomplete sentences are an occasional consequence of this system due to software limitations, ambient noise, and hardware issues. Any formal questions or concerns about the content, text or information contained within the body of this dictation should be directly addressed to the provider for clarification.

## 2023-11-08 ENCOUNTER — Encounter: Payer: Self-pay | Admitting: Surgery

## 2023-11-08 ENCOUNTER — Ambulatory Visit: Attending: Surgery | Admitting: Surgery

## 2023-11-08 VITALS — BP 165/98 | HR 87

## 2023-11-08 DIAGNOSIS — I723 Aneurysm of iliac artery: Secondary | ICD-10-CM

## 2023-11-08 NOTE — Progress Notes (Signed)
 [facility]ist of [facility]  Patient name: Mary [name] MRN: [id number] DOB: Apr 20, 1940 Sex: female   REASON FOR VISIT:    Follow up  HISOTRY OF PRESENT ILLNESS:    Mary [name] is a 83 y.o. 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  Her pain symptoms have resolved.  Because of the location of the aneurysm I felt noninvasive imaging would be difficult to fully visualize and so I sent her for CT scan as her 1 year follow-up.  She has no new complaints today   Her biggest issue is right hip pain from her arthritis.  She has been nonambulatory for 3 years.  She does not have any foot pain.  She is a diabetic.  She takes a statin for hypercholesterolemia.  She is medically managed for hypertension.  PAST MEDICAL HISTORY:   Past Medical History:  Diagnosis Date   Anemia    Antritis (stomach)    Aortic aneurysm (HCC)    Complication of anesthesia    Diabetes mellitus without complication (HCC)    Dyspnea    Gout    Hiatal hernia    High cholesterol    History of kidney stones    Hypertension    Hypoxia 08/2016   Low back pain    Osteoarthritis    Osteopenia    PONV (postoperative nausea and vomiting)    Rheumatoid arthritis (HCC)      FAMILY HISTORY:   Family History  Problem Relation Age of Onset   Arthritis Brother    Arthritis Sister    Arthritis Sister    Arthritis Brother    Arthritis Brother    Arthritis Brother     SOCIAL HISTORY:   Social History   Tobacco Use   Smoking status: Never    Passive exposure: Past   Smokeless tobacco: Never  Substance Use Topics   Alcohol use: No     ALLERGIES:   Allergies  Allergen Reactions   Aspirin Other (See Comments)    Itching per pt   Codeine Nausea Only     CURRENT MEDICATIONS:   Current Outpatient Medications  Medication Sig  Dispense Refill   acetaminophen  (TYLENOL ) 500 MG tablet as needed.      albuterol  (PROVENTIL ) (2.5 MG/3ML) 0.083% nebulizer solution Take 3 mLs (2.5 mg total) by nebulization every 6 (six) hours as needed for wheezing or shortness of breath. 360 mL 5   allopurinol  (ZYLOPRIM ) 100 MG tablet Take 200 mg by mouth daily.      amLODipine (NORVASC) 2.5 MG tablet Take 2.5 mg by mouth daily.     atorvastatin  (LIPITOR) 20 MG tablet SMARTSIG:1 Tablet(s) By Mouth Every Evening     benazepril  (LOTENSIN ) 20 MG tablet Take 1 tablet (20 mg total) by mouth daily. 31 tablet 0   benazepril  (LOTENSIN ) 40 MG tablet Take 40 mg by mouth daily.     carvedilol  (COREG ) 6.25 MG tablet      Cholecalciferol  (VITAMIN D3) 5000 units TABS Take 1 tablet by mouth. Takes on Monday, Wednesday, and Friday.     clorazepate  (TRANXENE ) 7.5 MG tablet Take 7.5 mg by mouth 2 (two) times daily.      FEROSUL 325 (65 Fe) MG tablet Take 325 mg by mouth daily.     folic acid  (FOLVITE ) 1 MG tablet Take 2 mg by mouth daily.  hydroxychloroquine  (PLAQUENIL ) 200 MG tablet Take 1 tablet by mouth daily Monday-Fridays only. 60 tablet 0   ipratropium-albuterol  (DUONEB) 0.5-2.5 (3) MG/3ML SOLN Take 3 mLs by nebulization 4 (four) times daily as needed. 360 mL 11   leflunomide  (ARAVA ) 20 MG tablet TAKE ONE (1) TABLET BY MOUTH EVERY DAY 90 tablet 0   levothyroxine  (SYNTHROID ) 100 MCG tablet Take 100 mcg by mouth daily.     magnesium oxide (MAG-OX) 400 MG tablet Take 1 tablet by mouth daily.     metFORMIN (GLUCOPHAGE-XR) 500 MG 24 hr tablet Take 500 mg by mouth daily.     omeprazole (PRILOSEC) 40 MG capsule Take 40 mg by mouth daily.     Polyethylene Glycol 3350  (MIRALAX  PO) Take by mouth.     No current facility-administered medications for this visit.    REVIEW OF SYSTEMS:   [X]  denotes positive finding, [ ]  denotes negative finding Cardiac  Comments:  Chest pain or chest pressure:    Shortness of breath upon exertion:    Short of breath  when lying flat:    Irregular heart rhythm:        Vascular    Pain in calf, thigh, or hip brought on by ambulation:    Pain in feet at night that wakes you up from your sleep:     Blood clot in your veins:    Leg swelling:         Pulmonary    Oxygen at home:    Productive cough:     Wheezing:         Neurologic    Sudden weakness in arms or legs:     Sudden numbness in arms or legs:     Sudden onset of difficulty speaking or slurred speech:    Temporary loss of vision in one eye:     Problems with dizziness:         Gastrointestinal    Blood in stool:     Vomited blood:         Genitourinary    Burning when urinating:     Blood in urine:        Psychiatric    Major depression:         Hematologic    Bleeding problems:    Problems with blood clotting too easily:        Skin    Rashes or ulcers:        Constitutional    Fever or chills:      PHYSICAL EXAM:   Vitals:   11/08/23 1417  BP: (!) 165/98  Pulse: 87  SpO2: (!) 89%    GENERAL: The patient is a well-nourished female, in no acute distress. The vital signs are documented above. CARDIAC: There is a regular rate and rhythm.  PULMONARY: Non-labored respirations ABDOMEN: Soft and non-tender with normal pitched bowel sounds.  SKIN: There are no ulcers or rashes noted. PSYCHIATRIC: The patient has a normal affect.  STUDIES:   I have reviewed the following CTA: 1. 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 2. 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  MEDICAL ISSUES:   Left internal iliac aneurysm: Minimal increase in size over the past year.  I am going to try and evaluate this with ultrasound in 1 year.  If this is unsuccessful, she will likely need to continue with CT scan surveillance    Wells  [name], MD, FACS [facility] [phone] Pager [phone]

## 2023-11-10 ENCOUNTER — Ambulatory Visit: Admitting: Rheumatology

## 2023-11-29 ENCOUNTER — Other Ambulatory Visit: Payer: Self-pay | Admitting: Physician Assistant

## 2023-11-29 NOTE — Telephone Encounter (Signed)
 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

## 2023-12-13 ENCOUNTER — Other Ambulatory Visit: Payer: Self-pay | Admitting: Physician Assistant

## 2023-12-13 NOTE — Telephone Encounter (Signed)
 Last Fill: 09/20/2023  Eye exam: 04/25/2023 Pt unable to do OCT/HVF due to mobility. Mac appeared normal on DFE, no PLQ toxicity noted.   Labs: 11/03/2023 RBC 3.22, Hgb 9.1, Hct 29.6, RDW 18.2, Eosinophils Absolute 0.7, CO2 21, Glucose 120, BUN 27, AST 9  Next Visit: 01/04/2024  Last Visit: 08/04/2023  IK:Myzlfjunpi arthritis of multiple sites with negative rheumatoid factor   Current Dose per office note 08/04/2023: Plaquenil  200 mg 1 tablet daily Monday through Friday   Okay to refill Plaquenil ?

## 2023-12-22 NOTE — Progress Notes (Unsigned)
 Office Visit Note  Patient: Mary [name]             Date of Birth: Nov 29, 1940           MRN: [id number]             PCP: Silvano Angeline FALCON, NP Referring: Silvano Angeline FALCON, NP Visit Date: 01/04/2024 Occupation: Data Unavailable  Subjective:  Chronic pain and stiffness  History of Present Illness: Mary [name] is a 83 y.o. female with history of rheumatoid arthritis.  Patient remains on  Plaquenil  200 mg 1 tablet daily Monday through Friday and Arava  20 mg 1 tablet by mouth daily.  She is tolerating combination therapy without any side effects.  She denies any recent gaps in therapy.  She is been taking Tylenol  500 mg twice daily for symptomatic relief.  She continues to have chronic pain in both shoulders and the right hip joint.  She denies any increase joint swelling at this time.  She denies any medical conditions.  She denies any recent or recurrent infections.  Activities of Daily Living:  Patient reports morning stiffness for 30 minutes.   Patient Denies nocturnal pain.  Difficulty dressing/grooming: Reports Difficulty climbing stairs: Reports Difficulty getting out of chair: Reports Difficulty using hands for taps, buttons, cutlery, and/or writing: Reports  Review of Systems  Constitutional:  Negative for fatigue.  HENT:  Negative for mouth sores and mouth dryness.   Eyes:  Negative for dryness.  Respiratory:  Negative for shortness of breath.   Cardiovascular:  Negative for chest pain and palpitations.  Gastrointestinal:  Positive for constipation and diarrhea. Negative for blood in stool.  Endocrine: Negative for increased urination.  Genitourinary:  Negative for involuntary urination.  Musculoskeletal:  Positive for joint pain, gait problem, joint pain, joint swelling, myalgias, muscle weakness, morning stiffness, muscle tenderness and myalgias.  Skin:  Negative for color change, rash, hair loss and sensitivity to sunlight.  Allergic/Immunologic: Negative for  susceptible to infections.  Neurological:  Negative for dizziness and headaches.  Hematological:  Negative for swollen glands.  Psychiatric/Behavioral:  Negative for depressed mood and sleep disturbance. The patient is not nervous/anxious.     PMFS History:  Patient Active Problem List   Diagnosis Date Noted   Normocytic anemia 07/08/2023   Wheelchair dependent 06/17/2022   Diabetes mellitus without complication (HCC) 01/27/2022   Mild persistent asthma without complication 11/18/2017   Age-related osteoporosis without current pathological fracture 11/19/2016   Acute respiratory failure (HCC)    Rheumatoid lung (HCC)    Hypoxia 09/03/2016   Essential hypertension 09/03/2016   Urinary tract infection 09/03/2016   Rheumatoid arthritis of multiple sites with negative rheumatoid factor (HCC) 02/28/2016   High risk medication use 02/28/2016   Idiopathic chronic gout of multiple sites without tophus 02/28/2016   Primary osteoarthritis of both knees 02/28/2016   Contracture, joint, multiple sites 02/28/2016   History of hypothyroidism 02/28/2016   History of diabetes mellitus 02/28/2016   History of shingles 02/28/2016   History of hyperlipidemia 02/28/2016   Rheumatoid arthritis involving both feet with positive rheumatoid factor (HCC) 05/14/2015    Past Medical History:  Diagnosis Date   Anemia    Antritis (stomach)    Aortic aneurysm    Complication of anesthesia    Diabetes mellitus without complication (HCC)    Dyspnea    Gout    Hiatal hernia    High cholesterol    History of kidney stones    Hypertension  Hypoxia 08/2016   Low back pain    Osteoarthritis    Osteopenia    PONV (postoperative nausea and vomiting)    Rheumatoid arthritis (HCC)     Family History  Problem Relation Age of Onset   Arthritis Brother    Arthritis Sister    Arthritis Sister    Arthritis Brother    Arthritis Brother    Arthritis Brother    Past Surgical History:  Procedure  Laterality Date   ABDOMINAL HYSTERECTOMY     Social History   Tobacco Use   Smoking status: Never    Passive exposure: Past   Smokeless tobacco: Never  Vaping Use   Vaping status: Never Used  Substance Use Topics   Alcohol use: No   Drug use: Never   Social History   Social History Narrative   Not on file     Immunization History  Administered Date(s) Administered   Fluad Quad(high Dose 65+) 12/01/2018, 11/19/2021   INFLUENZA, HIGH DOSE SEASONAL PF 11/26/2015   Influenza-Unspecified 12/07/2016   PFIZER(Purple Top)SARS-COV-2 Vaccination 04/07/2019, 04/28/2019   Pneumococcal-Unspecified 09/04/2015     Objective: Vital Signs: BP (!) 147/83   Pulse 76   Temp (!) 97.5 F (36.4 C)   Resp 16   Ht 5' (1.524 m)   BMI 31.25 kg/m    Physical Exam Vitals and nursing note reviewed.  Constitutional:      Appearance: She is well-developed.  HENT:     Head: Normocephalic and atraumatic.  Eyes:     Conjunctiva/sclera: Conjunctivae normal.  Cardiovascular:     Rate and Rhythm: Normal rate and regular rhythm.     Heart sounds: Normal heart sounds.  Pulmonary:     Effort: Pulmonary effort is normal.     Breath sounds: Normal breath sounds.     Comments: +Crackles at lung bases Abdominal:     General: Bowel sounds are normal.     Palpations: Abdomen is soft.  Musculoskeletal:     Cervical back: Normal range of motion.  Lymphadenopathy:     Cervical: No cervical adenopathy.  Skin:    General: Skin is warm and dry.     Capillary Refill: Capillary refill takes less than 2 seconds.  Neurological:     Mental Status: She is alert and oriented to person, place, and time.  Psychiatric:        Behavior: Behavior normal.      Musculoskeletal Exam: Patient remained in the wheelchair during examination.  C-spine has severely limited range of motion.  Thoracic kyphosis noted.  Severely limited range of motion of both shoulders with tenderness upon palpation.  Flexion  contractures of both elbows.  Severely limited range of motion of both wrists-synovial thickening noted.  Synovial thickening of all MCP joints with ulnar deviation and incomplete fist formation.  Limited extension of PIP joints.  Contractures of MCP and PIP joints.  Painful and severely limited range of motion of the right hip.  Flexion contractures of both knees.  Subluxation and synovial thickening of both ankles.  CDAI Exam: CDAI Score: -- Patient Global: --; Provider Global: -- Swollen: --; Tender: -- Joint Exam 01/04/2024   No joint exam has been documented for this visit   There is currently no information documented on the homunculus. Go to the Rheumatology activity and complete the homunculus joint exam.  Investigation: No additional findings.  Imaging: No results found.  Recent Labs: Lab Results  Component Value Date   WBC 7.2 11/03/2023  HGB 9.1 (L) 11/03/2023   PLT 267 11/03/2023   NA 139 11/03/2023   K 4.1 11/03/2023   CL 109 11/03/2023   CO2 21 (L) 11/03/2023   GLUCOSE 120 (H) 11/03/2023   BUN 27 (H) 11/03/2023   CREATININE 0.94 11/03/2023   BILITOT 0.2 11/03/2023   ALKPHOS 76 11/03/2023   AST 9 (L) 11/03/2023   ALT 5 11/03/2023   PROT 7.6 11/03/2023   ALBUMIN 3.7 11/03/2023   CALCIUM  8.9 11/03/2023   GFRAA 95 03/26/2020    Speciality Comments: Prior therapy: Plaquenil  (d/c per opthamologist recommendation) and SSZ (diarrhea) PLQ Eye Exam: 04/25/2023 Pt unable to do OCT/HVF due to mobility. Mac appeared normal on DFE, no PLQ toxicity noted.[facility] Follow up 6 months  Please scheudle patient's appts. every 3 month per patient request for lab compliance.    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  She is wheelchair-bound:  Patient continues to have chronic pain and stiffness involving multiple joints.  She has discomfort involving both shoulders as well as discomfort in the right hip.  She has been taking Tylenol  500 mg twice daily for symptomatic relief.  She remains on Plaquenil  200 mg 1 tablet daily Monday through Friday and Arava  20 mg 1 tablet daily.  No increased inflammation was noted on examination today.  No medication changes will be made at this time.  She was advised to notify us  if she develops any new or worsening symptoms. She will follow up in 5 months or sooner if needed.   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 Mannam on November 19, 2021.  Unable to obtain PFTs in the past.  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 No repeat imaging has been performed. Crackles at lung bases.   Encouraged to schedule follow up visit with pulmonologist.   High risk medication use - Plaquenil  200 mg 1 tablet daily Monday through Friday and Arava  20 mg 1 tablet by mouth daily.  CBC and CMP updated on 11/03/23.  She will be due to update lab work in December and every 3 months to monitor for drug toxicity. Future orders for CBC and CMP were placed today.  No recent or recurrent infections.  Discussed the importance of holding Arava  if she develops signs or symptoms of an infection.  Encouraged the patient to get the annual high-dose flu shot. - Plan: CMP14+EGFR, CBC with Differential/Platelet  Idiopathic chronic gout of multiple sites without tophus -She has not had any signs or symptoms of a gout flare.  She has clinically been doing well taking allopurinol  200 mg daily for management of gout.  Primary osteoarthritis of right knee: Limited extension.  No effusion noted. Primarily wheelchair bound.   S/P TKR (total knee replacement), left: Flexor contracture--limited extension.  Primarily wheelchair-bound.  Age-related osteoporosis without current pathological fracture - DEXA on 06/18/2020  Right femoral neck BMD 0.593 with T score -2.3.  Patient has declined to schedule updated bone density at this time.  Flexion contractures - End stage rheumatoid arthritis. Flexion contracture ~10 degrees in both elbows.  Flexion contracture of both knees.  Patient is primarily wheelchair-bound.  Other medical conditions are listed as follows:  History of diabetes mellitus  History of kidney stones  History of hyperlipidemia  Essential hypertension: Blood pressure  was elevated today in the office and was rechecked prior to leaving.  Patient was advised to monitor blood pressure closely to reach out to PCP if blood pressure remains elevated.  History of hypothyroidism  History of anemia  History of shingles  Orders: Orders Placed This Encounter  Procedures   CMP14+EGFR   CBC with Differential/Platelet   No orders of the defined types were placed in this encounter.   Follow-Up Instructions: Return in about 5 months (around 06/03/2024) for Rheumatoid arthritis.   Waddell CHRISTELLA Craze, PA-C  Note - This record has been created using Dragon software.  Chart creation errors have been sought, but may not always  have been located. Such creation errors do not reflect on  the standard of medical care.

## 2024-01-04 ENCOUNTER — Encounter: Payer: Self-pay | Admitting: Physician Assistant

## 2024-01-04 ENCOUNTER — Ambulatory Visit: Attending: Physician Assistant | Admitting: Physician Assistant

## 2024-01-04 VITALS — BP 147/83 | HR 76 | Temp 97.5°F | Resp 16 | Ht 60.0 in

## 2024-01-04 DIAGNOSIS — Z79899 Other long term (current) drug therapy: Secondary | ICD-10-CM | POA: Diagnosis not present

## 2024-01-04 DIAGNOSIS — J849 Interstitial pulmonary disease, unspecified: Secondary | ICD-10-CM

## 2024-01-04 DIAGNOSIS — M0609 Rheumatoid arthritis without rheumatoid factor, multiple sites: Secondary | ICD-10-CM

## 2024-01-04 DIAGNOSIS — M1711 Unilateral primary osteoarthritis, right knee: Secondary | ICD-10-CM

## 2024-01-04 DIAGNOSIS — Z8619 Personal history of other infectious and parasitic diseases: Secondary | ICD-10-CM

## 2024-01-04 DIAGNOSIS — Z8639 Personal history of other endocrine, nutritional and metabolic disease: Secondary | ICD-10-CM

## 2024-01-04 DIAGNOSIS — Z96652 Presence of left artificial knee joint: Secondary | ICD-10-CM

## 2024-01-04 DIAGNOSIS — Z862 Personal history of diseases of the blood and blood-forming organs and certain disorders involving the immune mechanism: Secondary | ICD-10-CM

## 2024-01-04 DIAGNOSIS — M2450 Contracture, unspecified joint: Secondary | ICD-10-CM

## 2024-01-04 DIAGNOSIS — M1A09X0 Idiopathic chronic gout, multiple sites, without tophus (tophi): Secondary | ICD-10-CM | POA: Diagnosis not present

## 2024-01-04 DIAGNOSIS — Z87442 Personal history of urinary calculi: Secondary | ICD-10-CM

## 2024-01-04 DIAGNOSIS — I10 Essential (primary) hypertension: Secondary | ICD-10-CM

## 2024-01-04 DIAGNOSIS — M810 Age-related osteoporosis without current pathological fracture: Secondary | ICD-10-CM

## 2024-01-04 NOTE — Patient Instructions (Signed)

## 2024-01-12 ENCOUNTER — Other Ambulatory Visit: Payer: Self-pay | Admitting: Physician Assistant

## 2024-02-03 LAB — LAB REPORT - SCANNED
A1c: 5.7
EGFR: 63.9
Free T4: 0.84 ng/dL
TSH: 5.21 (ref 0.41–5.90)

## 2024-02-07 ENCOUNTER — Telehealth: Payer: Self-pay

## 2024-02-07 NOTE — Telephone Encounter (Signed)
 Labs received from: PCP, Angeline Iba, NP  Drawn on: 02/02/2024  Reviewed by: Dr. Dolphus  Labs drawn: hemoglobin A1c, vitamin D , Free T4, Free T3, TSH, CMP, CBC w/diff, lipid panel   Results: HBA1c 5.7 TSH 5.21 Potassium 5.8 Chloride 110 CO2 20 Urea (BUN) 26 Albumin 3.2 RBC 3.31 HGB 9.8 MCV 102.0 MCHC 28.8 MPV 8.64  Cholesterol 276 Triglyceride 186 LDL Calc 203 HDL 36  Copy of labs sent to the scan center.

## 2024-02-09 ENCOUNTER — Ambulatory Visit: Payer: Self-pay | Admitting: Cardiovascular Disease

## 2024-02-09 DIAGNOSIS — E7849 Other hyperlipidemia: Secondary | ICD-10-CM

## 2024-02-09 DIAGNOSIS — Z789 Other specified health status: Secondary | ICD-10-CM

## 2024-02-10 ENCOUNTER — Other Ambulatory Visit (HOSPITAL_COMMUNITY): Payer: Self-pay

## 2024-02-10 ENCOUNTER — Telehealth: Payer: Self-pay | Admitting: Pharmacy Technician

## 2024-02-10 MED ORDER — REPATHA SURECLICK 140 MG/ML ~~LOC~~ SOAJ: 140.0000 mg | SUBCUTANEOUS | 3 refills | Status: AC

## 2024-02-10 NOTE — Telephone Encounter (Signed)
° °  Pharmacy Patient Advocate Encounter   Received notification from Pt Calls Messages that prior authorization for repatha is required/requested.   Insurance verification completed.   The patient is insured through [employer].   Per test claim: PA required; PA submitted to above mentioned insurance via Latent Key/confirmation #/EOC [facility] Status is pending

## 2024-02-10 NOTE — Progress Notes (Signed)
 Yes please start Repatha 140 mg every 2 weeks with repeat lipid labs in 3 months.

## 2024-02-10 NOTE — Telephone Encounter (Signed)
 Pharmacy Patient Advocate Encounter  Received notification from [employer] that Prior Authorization for repatha has been APPROVED from 02/10/24 to 03/01/25   PA #/Case ID/Reference #: [id number]

## 2024-02-16 ENCOUNTER — Encounter: Payer: Self-pay | Admitting: Rheumatology

## 2024-02-16 ENCOUNTER — Other Ambulatory Visit: Payer: Self-pay | Admitting: *Deleted

## 2024-02-16 DIAGNOSIS — Z79899 Other long term (current) drug therapy: Secondary | ICD-10-CM

## 2024-02-17 ENCOUNTER — Ambulatory Visit: Payer: Self-pay | Admitting: Physician Assistant

## 2024-02-17 LAB — CMP14+EGFR
ALT: 5 IU/L (ref 0–32)
AST: 10 IU/L (ref 0–40)
Albumin: 3.6 g/dL — ABNORMAL LOW (ref 3.7–4.7)
Alkaline Phosphatase: 100 IU/L (ref 48–129)
BUN/Creatinine Ratio: 21 (ref 12–28)
BUN: 22 mg/dL (ref 8–27)
Bilirubin Total: <0.2 mg/dL (ref 0.0–1.2)
CO2: 18 mmol/L — ABNORMAL LOW (ref 20–29)
Calcium: 9 mg/dL (ref 8.7–10.3)
Chloride: 105 mmol/L (ref 96–106)
Creatinine, Ser: 1.05 mg/dL — ABNORMAL HIGH (ref 0.57–1.00)
Globulin, Total: 3.2 g/dL (ref 1.5–4.5)
Glucose: 118 mg/dL — ABNORMAL HIGH (ref 70–99)
Potassium: 4.2 mmol/L (ref 3.5–5.2)
Sodium: 140 mmol/L (ref 134–144)
Total Protein: 6.8 g/dL (ref 6.0–8.5)
eGFR: 53 mL/min/1.73 — ABNORMAL LOW (ref >59)

## 2024-02-17 LAB — CBC WITH DIFFERENTIAL/PLATELET
Basophils Absolute: 0.1 x10E3/uL (ref 0.0–0.2)
Basos: 1 %
EOS (ABSOLUTE): 0.5 x10E3/uL — ABNORMAL HIGH (ref 0.0–0.4)
Eos: 5 %
Hematocrit: 27.8 % — ABNORMAL LOW (ref 34.0–46.6)
Hemoglobin: 8.8 g/dL — ABNORMAL LOW (ref 11.1–15.9)
Immature Grans (Abs): 0 x10E3/uL (ref 0.0–0.1)
Immature Granulocytes: 0 %
Lymphocytes Absolute: 1.3 x10E3/uL (ref 0.7–3.1)
Lymphs: 15 %
MCH: 28.9 pg (ref 26.6–33.0)
MCHC: 31.7 g/dL (ref 31.5–35.7)
MCV: 91 fL (ref 79–97)
Monocytes Absolute: 0.7 x10E3/uL (ref 0.1–0.9)
Monocytes: 8 %
Neutrophils Absolute: 6.3 x10E3/uL (ref 1.4–7.0)
Neutrophils: 71 %
Platelets: 353 x10E3/uL (ref 150–450)
RBC: 3.04 x10E6/uL — ABNORMAL LOW (ref 3.77–5.28)
RDW: 16 % — ABNORMAL HIGH (ref 11.7–15.4)
WBC: 8.9 x10E3/uL (ref 3.4–10.8)

## 2024-02-17 NOTE — Progress Notes (Signed)
 RBC count, hgb, and hct are low and have trended down.  Please clarify if she has any blood in the stool? Any recent procedure or source of bleeding? Recommend urgent eval by PCP or hematologist  Creatinine is elevated likely related to chronic anemia-avoid the use of NSAIDs. Glucose is 118.

## 2024-02-21 ENCOUNTER — Telehealth: Payer: Self-pay | Admitting: Oncology

## 2024-02-21 NOTE — Telephone Encounter (Signed)
 I spoke with patient to schedule lab and MD appointment for 03/16/2024 per staff message. Patient aware of date/time.

## 2024-03-20 ENCOUNTER — Other Ambulatory Visit: Payer: Self-pay | Admitting: Physician Assistant

## 2024-03-20 NOTE — Telephone Encounter (Signed)
 Last Fill: 11/29/2023  Labs: 02/16/2024 Patient and her daughter advised RBC count, hgb, and hct are low and have trended down.  Patient denies any blood in her stool, any recent procedure or source of bleeding. Patient states she has not been eating well due to a hernia in her stomach.  Patient and her daughter advised recommend urgent eval by PCP or hematologist. Copy of results sent to PCP and hematologist. Patient's daughter states she will reach out to hematologist.    Creatinine is elevated likely related to chronic anemia-avoid the use of NSAIDs. Glucose is 118.   Next Visit: 06/08/2024  Last Visit: 01/04/2024  DX: Rheumatoid arthritis of multiple sites with negative rheumatoid factor   Current Dose per office note on 01/04/2024: Arava  20 mg 1 tablet by mouth daily.   Okay to refill Arava  ?

## 2024-03-22 ENCOUNTER — Inpatient Hospital Stay: Attending: Oncology

## 2024-03-22 ENCOUNTER — Other Ambulatory Visit: Payer: Self-pay | Admitting: Oncology

## 2024-03-22 ENCOUNTER — Inpatient Hospital Stay: Admitting: Oncology

## 2024-03-22 VITALS — BP 158/72 | HR 80 | Temp 98.0°F | Resp 18

## 2024-03-22 DIAGNOSIS — D509 Iron deficiency anemia, unspecified: Secondary | ICD-10-CM | POA: Insufficient documentation

## 2024-03-22 DIAGNOSIS — D649 Anemia, unspecified: Secondary | ICD-10-CM | POA: Diagnosis not present

## 2024-03-22 DIAGNOSIS — D508 Other iron deficiency anemias: Secondary | ICD-10-CM

## 2024-03-22 LAB — CMP (CANCER CENTER ONLY)
ALT: <5 U/L (ref 0–44)
AST: 14 U/L — ABNORMAL LOW (ref 15–41)
Albumin: 3.8 g/dL (ref 3.5–5.0)
Alkaline Phosphatase: 100 U/L (ref 38–126)
Anion gap: 15 (ref 5–15)
BUN: 28 mg/dL — ABNORMAL HIGH (ref 8–23)
CO2: 20 mmol/L — ABNORMAL LOW (ref 22–32)
Calcium: 9.3 mg/dL (ref 8.9–10.3)
Chloride: 106 mmol/L (ref 98–111)
Creatinine: 1.27 mg/dL — ABNORMAL HIGH (ref 0.44–1.00)
GFR, Estimated: 42 mL/min — ABNORMAL LOW
Glucose, Bld: 134 mg/dL — ABNORMAL HIGH (ref 70–99)
Potassium: 4.3 mmol/L (ref 3.5–5.1)
Sodium: 140 mmol/L (ref 135–145)
Total Bilirubin: 0.2 mg/dL (ref 0.0–1.2)
Total Protein: 7.7 g/dL (ref 6.5–8.1)

## 2024-03-22 LAB — CBC WITH DIFFERENTIAL (CANCER CENTER ONLY)
Abs Immature Granulocytes: 0.02 K/uL (ref 0.00–0.07)
Basophils Absolute: 0.1 K/uL (ref 0.0–0.1)
Basophils Relative: 1 %
Eosinophils Absolute: 0.6 K/uL — ABNORMAL HIGH (ref 0.0–0.5)
Eosinophils Relative: 7 %
HCT: 26.8 % — ABNORMAL LOW (ref 36.0–46.0)
Hemoglobin: 8.2 g/dL — ABNORMAL LOW (ref 12.0–15.0)
Immature Granulocytes: 0 %
Lymphocytes Relative: 15 %
Lymphs Abs: 1.3 K/uL (ref 0.7–4.0)
MCH: 27.6 pg (ref 26.0–34.0)
MCHC: 30.6 g/dL (ref 30.0–36.0)
MCV: 90.2 fL (ref 80.0–100.0)
Monocytes Absolute: 0.5 K/uL (ref 0.1–1.0)
Monocytes Relative: 6 %
Neutro Abs: 6.2 K/uL (ref 1.7–7.7)
Neutrophils Relative %: 71 %
Platelet Count: 313 K/uL (ref 150–400)
RBC: 2.97 MIL/uL — ABNORMAL LOW (ref 3.87–5.11)
RDW: 17.9 % — ABNORMAL HIGH (ref 11.5–15.5)
WBC Count: 8.8 K/uL (ref 4.0–10.5)
nRBC: 0 % (ref 0.0–0.2)

## 2024-03-22 LAB — IRON AND IRON BINDING CAPACITY (CC-WL,HP ONLY)
Iron: 26 ug/dL — ABNORMAL LOW (ref 28–170)
Saturation Ratios: 9 % — ABNORMAL LOW (ref 10.4–31.8)
TIBC: 272 ug/dL (ref 250–450)
UIBC: 246 ug/dL

## 2024-03-22 LAB — FERRITIN: Ferritin: 285 ng/mL (ref 11–307)

## 2024-03-22 NOTE — Progress Notes (Signed)
 "  [facility]  HEMATOLOGY CLINIC PROGRESS NOTE  PATIENT NAME: Mary [name]   MR#: [id number] DOB: 30-May-1940  Patient Care Team: Silvano Angeline FALCON, NP as PCP - General  Date of visit: 03/22/2024   ASSESSMENT & PLAN:   Mary [name] is a 84 y.o. very pleasant lady with a past medical history of rheumatoid arthritis on Plaquenil  and leflunomide , hypertension, osteopenia, hypothyroidism, diabetes mellitus, hiatal hernia, was referred to our service in May 2025 for evaluation of normocytic anemia.  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    Normocytic anemia Chronic anemia with hemoglobin in the range of 8-10, fluctuating over 6-7 years. 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    Recent iron labs in March 2025 showed slightly low iron levels; she is on prescription iron supplements.    No acute blood loss or significant symptoms.   On initial consultation with us  on 07/07/2023, labs showed stable hemoglobin of 9.1, hematocrit 28.1, MCV 90.4.  White count 8800 with normal differential.  Platelet count normal.  Creatinine 1.06.  Iron studies showed no evidence of iron deficiency.  Ferritin, B12, folate, methylmalonic acid, LDH, haptoglobin were all unremarkable.  Coombs test negative.   Previously her labs were stable.  Recently in December 2025, labs showed lower hemoglobin at her PCPs office at 8.8.  Labs today showed slightly worsening anemia with hemoglobin of 8.2.  Iron studies showed evidence of iron deficiency with iron saturation of 9%, iron decreased to 26.  She has been on oral iron supplements.  Given persistent iron deficiency anemia, despite oral iron supplementation, will proceed with IV iron.  We got approval to proceed with Venofer, weekly x 5 doses.  Our infusion center at [facility]. will arrange  appointments and inform patient.  I will plan to see her in 3 months for follow-up with repeat labs.    I spent a total of 32 minutes during this encounter with the patient including review of chart and various tests results, discussions about plan of care and coordination of care plan.  I reviewed lab results and outside records for this visit and discussed relevant results with the patient. Diagnosis, plan of care and treatment options were also discussed in detail with the patient. Opportunity provided to ask questions and answers provided to her apparent satisfaction. Provided instructions to call our clinic with any problems, questions or concerns prior to return visit. I recommended to continue follow-up with PCP and sub-specialists. She verbalized understanding and agreed with the plan. No barriers to learning was detected.  Mary Patten, MD  03/22/2024 3:22 PM   [facility] - A DEPT [name] [address] [place] [address] Dept: [phone] Dept Fax: [phone]   CHIEF COMPLAINT/ REASON FOR VISIT:  Follow-up for chronic normocytic anemia, likely medication induced versus anemia of chronic disease from rheumatoid arthritis.  INTERVAL HISTORY:  Discussed the use of AI scribe software for clinical note transcription with the patient, who gave verbal consent to proceed.  History of Present Illness Mary [name] is an 84 year old female with chronic iron deficiency anemia who presents for hematology follow-up due to persistently low hemoglobin and iron levels despite oral iron supplementation.  Hemoglobin has progressively declined, with recent laboratory results showing a decrease from 9.1 g/dL in September 2025 to 8.8 g/dL in  December 2025, and 8.2 g/dL at today's visit. Hemoglobin has consistently remained below normal, ranging between 9 and 10 g/dL since 7981, and has never normalized. Iron studies  today reveal iron saturation at 9% and total iron at 26, both below reference ranges.  She remains asymptomatic, denying fatigue, chest pain, shortness of breath, or evidence of acute blood loss. No weight loss, fevers, chills, or night sweats reported.  She continues daily oral iron supplementation (65 mg) with vitamin C without improvement in anemia. Appetite is variable, sometimes limited by difficulty with meal preparation. Diet has not changed recently, and she occasionally consumes yogurt.  She reports increased water intake in the mornings with medications but has difficulty maintaining hydration throughout the day.    SUMMARY OF HEMATOLOGIC HISTORY:  She was referred by her GI doctor for evaluation of anemia.   On 05/19/2023, labs at her gastroenterologist office showed hemoglobin of 9.5, hematocrit 29.7, MCV 92.2.  White count 5800 with normal differential, platelet count 307,000.  Iron was decreased at 40.  B12 normal at 286.  Folate normal.  She was referred to us  for further evaluation of anemia.  On review of records, she has had fluctuating anemia at least since 2018 with hemoglobin in the range of 8.8-11.3.   No active bleeding, such as epistaxis or gum bleeds, and no significant weight loss or decreased appetite, although she experiences fullness and pain due to a small hernia, which affects her eating habits. She also experiences nausea when eating.   She has been on iron supplements, prescribed by her GI doctor, and takes them every other day due to constipation. She has been on this regimen for the past five to six weeks. Her recent blood work at her primary care doctor's office showed improvement, although specific values were not provided to her.   She has a long-standing history of rheumatoid arthritis, managed with leflunomide  (Arava ) and hydroxychloroquine  (Plaquenil ). She has been on treatment for over forty years. Her arthritis doctor has indicated that her medications  could contribute to her anemia.   She also has a history of gout, which influences her dietary choices, such as avoiding beef liver despite her preference for it.   Chronic anemia with hemoglobin in the range of 8-10, fluctuating over 6-7 years. 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    Recent iron labs in March 2025 showed slightly low iron levels; she is on prescription iron supplements.    No acute blood loss or significant symptoms.   On initial consultation with us  on 07/07/2023, labs showed stable hemoglobin of 9.1, hematocrit 28.1, MCV 90.4.  White count 8800 with normal differential.  Platelet count normal.  Creatinine 1.06.  Iron studies showed no evidence of iron deficiency.  Ferritin, B12, folate, methylmalonic acid, LDH, haptoglobin were all unremarkable.  Coombs test negative.    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   Continue current iron supplementation.  I do not see need for IV iron at this time.  I have reviewed the past medical history, past surgical history, social history and family history with the patient and they are unchanged from previous note.  ALLERGIES: She is allergic to aspirin and codeine.  MEDICATIONS:  Current Outpatient Medications  Medication Sig Dispense Refill   acetaminophen  (TYLENOL ) 500 MG tablet as needed.      albuterol  (PROVENTIL ) (2.5 MG/3ML) 0.083% nebulizer  solution Take 3 mLs (2.5 mg total) by nebulization every 6 (six) hours as needed for wheezing or shortness of breath. 360 mL 5   allopurinol  (ZYLOPRIM ) 100 MG tablet Take 200 mg by mouth daily.      amLODipine (NORVASC) 2.5 MG tablet Take 2.5 mg by mouth daily.     atorvastatin  (LIPITOR) 20 MG tablet Take 20 mg by mouth every evening.     benazepril  (LOTENSIN ) 40 MG tablet Take 40 mg by mouth daily.      carvedilol  (COREG ) 6.25 MG tablet      Cholecalciferol  (VITAMIN D3) 5000 units TABS Take 1 tablet by mouth. Takes on Monday, Wednesday, and Friday.     clorazepate  (TRANXENE ) 7.5 MG tablet Take 7.5 mg by mouth 2 (two) times daily.      Evolocumab  (REPATHA  SURECLICK) 140 MG/ML SOAJ Inject 140 mg into the skin every 14 (fourteen) days. 6 mL 3   famotidine (PEPCID) 20 MG tablet Take 20 mg by mouth 2 (two) times daily as needed.     FEROSUL 325 (65 Fe) MG tablet Take 325 mg by mouth daily.     folic acid  (FOLVITE ) 1 MG tablet Take 2 mg by mouth daily.     hydroxychloroquine  (PLAQUENIL ) 200 MG tablet Take 1 tablet by mouth daily Monday-Fridays only. 60 tablet 0   ipratropium-albuterol  (DUONEB) 0.5-2.5 (3) MG/3ML SOLN Take 3 mLs by nebulization 4 (four) times daily as needed. 360 mL 11   leflunomide  (ARAVA ) 20 MG tablet TAKE ONE (1) TABLET BY MOUTH EVERY DAY 90 tablet 0   levothyroxine  (SYNTHROID ) 100 MCG tablet Take 100 mcg by mouth daily.     magnesium oxide (MAG-OX) 400 MG tablet Take 1 tablet by mouth daily.     metFORMIN (GLUCOPHAGE-XR) 500 MG 24 hr tablet Take 500 mg by mouth daily.     omeprazole (PRILOSEC) 40 MG capsule Take 40 mg by mouth daily.     Polyethylene Glycol 3350  (MIRALAX  PO) Take by mouth.     No current facility-administered medications for this visit.     REVIEW OF SYSTEMS:    Review of Systems - Oncology  All other pertinent systems were reviewed with the patient and are negative.  PHYSICAL EXAMINATION:    Onc Performance Status - 03/22/24 1500       ECOG Perf Status   ECOG Perf Status Ambulatory and capable of all selfcare but unable to carry out any work activities.  Up and about more than 50% of waking hours      KPS SCALE   KPS % SCORE Requires considerable assistance, and frequent medical care           Vitals:   03/22/24 1422 03/22/24 1428  BP: (!) 154/78 (!) 158/72  Pulse: 80   Resp: 18   Temp: 98 F (36.7 C)   SpO2: 96%     Physical  Exam Constitutional:      General: She is not in acute distress.    Appearance: Normal appearance.  HENT:     Head: Normocephalic and atraumatic.  Cardiovascular:     Rate and Rhythm: Normal rate.  Pulmonary:     Effort: Pulmonary effort is normal. No respiratory distress.  Abdominal:     General: There is no distension.  Neurological:     General: No focal deficit present.     Mental Status: She is alert and oriented to person, place, and time.  Psychiatric:        Mood and  Affect: Mood normal.        Behavior: Behavior normal.     LABORATORY DATA:   I have reviewed the data as listed.  Results for orders placed or performed in visit on 03/22/24  Iron and Iron Binding Capacity ([facility] only)  Result Value Ref Range   Iron 26 (L) 28 - 170 ug/dL   TIBC 727 749 - 549 ug/dL   Saturation Ratios 9 (L) 10.4 - 31.8 %   UIBC 246 ug/dL  CMP (Cancer Center only)  Result Value Ref Range   Sodium 140 135 - 145 mmol/L   Potassium 4.3 3.5 - 5.1 mmol/L   Chloride 106 98 - 111 mmol/L   CO2 20 (L) 22 - 32 mmol/L   Glucose, Bld 134 (H) 70 - 99 mg/dL   BUN 28 (H) 8 - 23 mg/dL   Creatinine 8.72 (H) 9.55 - 1.00 mg/dL   Calcium  9.3 8.9 - 10.3 mg/dL   Total Protein 7.7 6.5 - 8.1 g/dL   Albumin 3.8 3.5 - 5.0 g/dL   AST 14 (L) 15 - 41 U/L   ALT <5 0 - 44 U/L   Alkaline Phosphatase 100 38 - 126 U/L   Total Bilirubin 0.2 0.0 - 1.2 mg/dL   GFR, Estimated 42 (L) >60 mL/min   Anion gap 15 5 - 15  CBC with Differential (Cancer Center Only)  Result Value Ref Range   WBC Count 8.8 4.0 - 10.5 K/uL   RBC 2.97 (L) 3.87 - 5.11 MIL/uL   Hemoglobin 8.2 (L) 12.0 - 15.0 g/dL   HCT 73.1 (L) 63.9 - 53.9 %   MCV 90.2 80.0 - 100.0 fL   MCH 27.6 26.0 - 34.0 pg   MCHC 30.6 30.0 - 36.0 g/dL   RDW 82.0 (H) 88.4 - 84.4 %   Platelet Count 313 150 - 400 K/uL   nRBC 0.0 0.0 - 0.2 %   Neutrophils Relative % 71 %   Neutro Abs 6.2 1.7 - 7.7 K/uL   Lymphocytes Relative 15 %   Lymphs Abs 1.3 0.7 - 4.0 K/uL    Monocytes Relative 6 %   Monocytes Absolute 0.5 0.1 - 1.0 K/uL   Eosinophils Relative 7 %   Eosinophils Absolute 0.6 (H) 0.0 - 0.5 K/uL   Basophils Relative 1 %   Basophils Absolute 0.1 0.0 - 0.1 K/uL   Immature Granulocytes 0 %   Abs Immature Granulocytes 0.02 0.00 - 0.07 K/uL    RADIOGRAPHIC STUDIES:  CT ANGIO ABDOMEN PELVIS  W & WO CONTRAST CLINICAL DATA:  Aneurysm of the internal iliac artery  EXAM: CTA ABDOMEN AND PELVIS WITHOUT AND WITH CONTRAST  TECHNIQUE: Multidetector CT imaging of the abdomen and pelvis was performed using the standard protocol during bolus administration of intravenous contrast. Multiplanar reconstructed images and MIPs were obtained and reviewed to evaluate the vascular anatomy.  RADIATION DOSE REDUCTION: This exam was performed according to the departmental dose-optimization program which includes automated exposure control, adjustment of the mA and/or kV according to patient size and/or use of iterative reconstruction technique.  CONTRAST:  OMNIPAQUE  IOHEXOL  350 MG/ML SOLN  COMPARISON:  None Available.  FINDINGS: VASCULAR  Aorta: The abdominal aorta is ectatic and demonstrates mild exophytic irregular plaque in the infrarenal segment which results in a stenosis of less than 50%. No abdominal aortic aneurysm.  Celiac: Patent.  SMA: Patent.  Renals: Mild atherosclerotic changes bilaterally, patent.  IMA: Patent.  Inflow: The right common iliac artery demonstrates mild  diffuse atherosclerotic change and is ectatic. The distal segment is estimated at 1.6 cm in diameter. The right internal iliac artery is ectatic and demonstrates mild atherosclerotic changes. The right internal iliac artery is estimated at 1.1 cm proximally. The right external iliac artery is ectatic and patent.  The left common iliac artery is ectatic and demonstrates mild diffuse atherosclerotic changes. Maximal diameter is estimated at 1.6 cm. The left  internal iliac artery is dilated in the distal segment estimated at 1.6 cm and maximal diameter on image 93 of CT series 5. The left external iliac artery is ectatic and patent.  Proximal Outflow: The right common femoral artery is patent. The left common femoral artery is patent.  Veins: No obvious venous abnormality within the limitations of this arterial phase study.  Review of the MIP images confirms the above findings.  NON-VASCULAR  Lower chest: Bibasilar scarring.  Hepatobiliary: The right posterior liver cyst is present measuring 1.5 cm. Gallbladder is grossly unremarkable.  Pancreas: Within normal limits.  Spleen: Within normal limits.  Adrenals/Urinary Tract: The adrenal glands are within normal limits. There is cortical thinning in both kidneys. There is mild dilation of the proximal left renal collecting system of uncertain significance which may be physiologic. A small right lower pole renal cyst is present which is too small for definitive characterization. Urinary bladder is grossly unremarkable.  Stomach/Bowel: Small hiatal hernia. No dilated loops of bowel are appreciated.  Lymphatic: No lymphadenopathy.  Reproductive: Status post hysterectomy. No adnexal masses.  Other: Nothing significant.  Musculoskeletal: Degenerative changes with underlying osteopenia. Left convex lumbar curvature.  IMPRESSION: 1. 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 2. 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  Electronically Signed   By: Maude Naegeli M.D.   On: 10/19/2023 09:52   Orders Placed This Encounter  Procedures   CBC with Differential (Cancer Center Only)    Standing Status:   Future    Expiration Date:   03/22/2025   CMP (Cancer Center only)    Standing Status:   Future    Expiration Date:   03/22/2025   Iron and Iron Binding Capacity ([facility] only)     Standing Status:   Future    Expiration Date:   03/22/2025   Ferritin    Standing Status:   Future    Expiration Date:   03/22/2025     Future Appointments  Date Time Provider Department Center  03/31/2024  1:00 PM [facility] CHAIR 1 [facility] None  04/03/2024  1:00 PM [facility] CHAIR 2 [facility] None  04/05/2024  2:30 PM [facility] CH[facility]IR 1 [facility] None  04/07/2024  1:00 PM [facility]IR 3 [facility] None  04/10/2024 11:00 AM [facility] CHAIR 1 [facility] None  06/08/2024  2:00 PM Deveshwar, Maya, MD [facility] None  07/19/2024  2:30 PM [facility] None  07/19/2024  3:00 PM Laurencia Roma, MD [facility] None  11/08/2024  2:30 PM [facility] None  11/08/2024  3:00 PM Dorin Stooksbury, MD [facility] None     This document was completed utilizing speech recognition software. Grammatical errors, random word insertions, pronoun errors, and incomplete sentences are an occasional consequence of this system due to software limitations, ambient noise, and hardware issues. Any formal questions or concerns about the content, text or information contained within the body of this dictation should be directly addressed to the provider for clarification.  "

## 2024-03-23 ENCOUNTER — Telehealth: Payer: Self-pay

## 2024-03-23 ENCOUNTER — Encounter: Payer: Self-pay | Admitting: *Deleted

## 2024-03-23 NOTE — Telephone Encounter (Signed)
 Dr. Pasam, patient will be scheduled as soon as possible.  Auth Submission: NO AUTH NEEDED Site of care: Site of care: [facility] Payer: [employer] medicare Medication & CPT/J Code(s) submitted: Venofer (Iron Sucrose) J1756 Diagnosis Code:  Route of submission (phone, fax, portal):  Phone # Fax # Auth type: Buy/Bill PB Units/visits requested: 200mg  x 5 doses Reference number:  Approval from: 03/23/24 to 06/29/24

## 2024-03-25 ENCOUNTER — Encounter: Payer: Self-pay | Admitting: Oncology

## 2024-03-25 NOTE — Assessment & Plan Note (Signed)
 Chronic anemia with hemoglobin in the range of 8-10, fluctuating over 6-7 years. 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    Recent iron labs in March 2025 showed slightly low iron levels; she is on prescription iron supplements.    No acute blood loss or significant symptoms.   On initial consultation with us  on 07/07/2023, labs showed stable hemoglobin of 9.1, hematocrit 28.1, MCV 90.4.  White count 8800 with normal differential.  Platelet count normal.  Creatinine 1.06.  Iron studies showed no evidence of iron deficiency.  Ferritin, B12, folate, methylmalonic acid, LDH, haptoglobin were all unremarkable.  Coombs test negative.   Previously her labs were stable.  Recently in December 2025, labs showed lower hemoglobin at her PCPs office at 8.8.  Labs today showed slightly worsening anemia with hemoglobin of 8.2.  Iron studies showed evidence of iron deficiency with iron saturation of 9%, iron decreased to 26.  She has been on oral iron supplements.  Given persistent iron deficiency anemia, despite oral iron supplementation, will proceed with IV iron.  We got approval to proceed with Venofer, weekly x 5 doses.  Our infusion center at [facility]. will arrange appointments and inform patient.  I will plan to see her in 3 months for follow-up with repeat labs.

## 2024-03-31 ENCOUNTER — Ambulatory Visit

## 2024-04-03 ENCOUNTER — Ambulatory Visit

## 2024-04-05 ENCOUNTER — Encounter

## 2024-04-05 VITALS — BP 171/94 | HR 84 | Temp 98.5°F | Resp 20

## 2024-04-05 DIAGNOSIS — D508 Other iron deficiency anemias: Secondary | ICD-10-CM | POA: Diagnosis not present

## 2024-04-05 MED ORDER — DIPHENHYDRAMINE HCL 25 MG PO CAPS
25.0000 mg | ORAL_CAPSULE | Freq: Once | ORAL | Status: AC
  Administered 2024-04-05: 25 mg via ORAL

## 2024-04-05 MED ORDER — DIPHENHYDRAMINE HCL 25 MG PO CAPS
ORAL_CAPSULE | ORAL | Status: AC
  Filled 2024-04-05: qty 1

## 2024-04-05 MED ORDER — IRON SUCROSE 200 MG IVPB - SIMPLE MED
200.0000 mg | Freq: Once | Status: AC
  Administered 2024-04-05: 200 mg via INTRAVENOUS

## 2024-04-05 MED ORDER — ACETAMINOPHEN 325 MG PO TABS
ORAL_TABLET | ORAL | Status: AC
  Filled 2024-04-05: qty 2

## 2024-04-05 MED ORDER — ACETAMINOPHEN 325 MG PO TABS
650.0000 mg | ORAL_TABLET | Freq: Once | ORAL | Status: AC
  Administered 2024-04-05: 650 mg via ORAL

## 2024-04-05 NOTE — Progress Notes (Signed)
 Diagnosis: Iron  Deficiency Anemia  Provider:  Sherre Clapper MD  Procedure: IV Infusion  IV Type: Peripheral, IV Location: L Forearm  Venofer  (Iron  Sucrose), Dose: 200 mg  Infusion Start Time: 1159  Infusion Stop Time: 1215  Post Infusion IV Care: Observation period completed and Peripheral IV Discontinued  Discharge: Condition: Good, Destination: Home . AVS Provided  Performed by:  Waddell LOISE Freshwater, RN

## 2024-04-07 ENCOUNTER — Encounter (INDEPENDENT_AMBULATORY_CARE_PROVIDER_SITE_OTHER): Admitting: *Deleted

## 2024-04-07 VITALS — BP 181/98 | HR 84 | Temp 98.5°F | Resp 20

## 2024-04-07 DIAGNOSIS — D508 Other iron deficiency anemias: Secondary | ICD-10-CM

## 2024-04-07 MED ORDER — DIPHENHYDRAMINE HCL 25 MG PO CAPS
25.0000 mg | ORAL_CAPSULE | Freq: Once | ORAL | Status: AC
  Administered 2024-04-07: 25 mg via ORAL

## 2024-04-07 MED ORDER — ACETAMINOPHEN 325 MG PO TABS
ORAL_TABLET | ORAL | Status: AC
  Filled 2024-04-07: qty 2

## 2024-04-07 MED ORDER — ACETAMINOPHEN 325 MG PO TABS
650.0000 mg | ORAL_TABLET | Freq: Once | ORAL | Status: AC
  Administered 2024-04-07: 650 mg via ORAL

## 2024-04-07 MED ORDER — DIPHENHYDRAMINE HCL 25 MG PO CAPS
ORAL_CAPSULE | ORAL | Status: AC
  Filled 2024-04-07: qty 1

## 2024-04-07 MED ORDER — IRON SUCROSE 200 MG IVPB - SIMPLE MED
200.0000 mg | Freq: Once | Status: AC
  Administered 2024-04-07: 200 mg via INTRAVENOUS

## 2024-04-07 NOTE — Progress Notes (Deleted)
 Diagnosis: Iron  Deficiency Anemia  Provider:  Sherre Clapper MD  Procedure: IV Infusion  IV Type: Peripheral, IV Location: L Forearm  Venofer  (Iron  Sucrose), Dose: 200 mg  Infusion Start Time: 1349  Infusion Stop Time: 1404  Post Infusion IV Care: Patient declined observation and IV remained in place, for future infusions  Discharge: Condition: Good, Destination: Home . AVS Provided  Performed by:  Waddell LOISE Freshwater, RN

## 2024-04-07 NOTE — Progress Notes (Cosign Needed)
 Diagnosis: Iron  Deficiency Anemia  Provider:  Sherre Clapper MD  Procedure: IV Infusion  IV Type: Peripheral, IV Location: L Forearm  Venofer  (Iron  Sucrose), Dose: 200 mg  Infusion Start Time: 1349  Infusion Stop Time: 1404  Post Infusion IV Care: Patient declined observation, IV left in place, flushed with NS 10 ml, locked, and wrapped with Coban for future infusions.  Discharge: Condition: Good, Destination: Home . AVS Provided  Performed by:  Antavia Tandy E, RN

## 2024-04-10 ENCOUNTER — Ambulatory Visit

## 2024-04-12 ENCOUNTER — Ambulatory Visit

## 2024-04-14 ENCOUNTER — Ambulatory Visit

## 2024-06-08 ENCOUNTER — Ambulatory Visit: Admitting: Rheumatology

## 2024-07-19 ENCOUNTER — Inpatient Hospital Stay: Admitting: Oncology

## 2024-07-19 ENCOUNTER — Inpatient Hospital Stay

## 2024-11-08 ENCOUNTER — Other Ambulatory Visit

## 2024-11-08 ENCOUNTER — Ambulatory Visit: Admitting: Oncology
